# Patient Record
Sex: Female | Born: 1950
Health system: Southern US, Community
[De-identification: ages and names within clinical notes are randomized; demographics above are authoritative.]

## PROBLEM LIST (undated history)

## (undated) DIAGNOSIS — F419 Anxiety disorder, unspecified: Secondary | ICD-10-CM

## (undated) DIAGNOSIS — F329 Major depressive disorder, single episode, unspecified: Secondary | ICD-10-CM

## (undated) DIAGNOSIS — H269 Unspecified cataract: Secondary | ICD-10-CM

## (undated) DIAGNOSIS — M810 Age-related osteoporosis without current pathological fracture: Secondary | ICD-10-CM

## (undated) DIAGNOSIS — T7840XA Allergy, unspecified, initial encounter: Secondary | ICD-10-CM

## (undated) DIAGNOSIS — K219 Gastro-esophageal reflux disease without esophagitis: Secondary | ICD-10-CM

## (undated) DIAGNOSIS — E785 Hyperlipidemia, unspecified: Secondary | ICD-10-CM

## (undated) DIAGNOSIS — I10 Essential (primary) hypertension: Secondary | ICD-10-CM

## (undated) DIAGNOSIS — F32A Depression, unspecified: Secondary | ICD-10-CM

## (undated) HISTORY — PX: BREAST BIOPSY: SHX20

## (undated) HISTORY — DX: Age-related osteoporosis without current pathological fracture: M81.0

## (undated) HISTORY — DX: Anxiety disorder, unspecified: F41.9

## (undated) HISTORY — DX: Depression, unspecified: F32.A

## (undated) HISTORY — DX: Gastro-esophageal reflux disease without esophagitis: K21.9

## (undated) HISTORY — DX: Unspecified cataract: H26.9

## (undated) HISTORY — DX: Allergy, unspecified, initial encounter: T78.40XA

## (undated) HISTORY — DX: Hyperlipidemia, unspecified: E78.5

## (undated) HISTORY — DX: Major depressive disorder, single episode, unspecified: F32.9

## (undated) HISTORY — DX: Essential (primary) hypertension: I10

## (undated) HISTORY — PX: RHINOPLASTY: SUR1284

---

## 1980-11-27 HISTORY — PX: VAGINAL HYSTERECTOMY: SUR661

## 2001-11-27 DIAGNOSIS — J301 Allergic rhinitis due to pollen: Secondary | ICD-10-CM | POA: Insufficient documentation

## 2006-11-27 HISTORY — PX: OTHER SURGICAL HISTORY: SHX169

## 2007-09-29 ENCOUNTER — Other Ambulatory Visit: Payer: Self-pay

## 2007-09-29 ENCOUNTER — Inpatient Hospital Stay: Payer: Self-pay

## 2007-10-17 DIAGNOSIS — K922 Gastrointestinal hemorrhage, unspecified: Secondary | ICD-10-CM | POA: Insufficient documentation

## 2007-11-14 ENCOUNTER — Ambulatory Visit: Payer: Self-pay | Admitting: Gastroenterology

## 2007-11-14 LAB — HM COLONOSCOPY

## 2008-01-13 ENCOUNTER — Ambulatory Visit: Payer: Self-pay | Admitting: Family Medicine

## 2013-06-12 ENCOUNTER — Ambulatory Visit: Payer: Self-pay | Admitting: Family Medicine

## 2014-07-30 DIAGNOSIS — Z1159 Encounter for screening for other viral diseases: Secondary | ICD-10-CM | POA: Diagnosis not present

## 2014-07-30 DIAGNOSIS — I10 Essential (primary) hypertension: Secondary | ICD-10-CM | POA: Diagnosis not present

## 2014-07-30 DIAGNOSIS — R7309 Other abnormal glucose: Secondary | ICD-10-CM | POA: Diagnosis not present

## 2014-07-30 DIAGNOSIS — F411 Generalized anxiety disorder: Secondary | ICD-10-CM | POA: Diagnosis not present

## 2014-07-30 DIAGNOSIS — L259 Unspecified contact dermatitis, unspecified cause: Secondary | ICD-10-CM | POA: Diagnosis not present

## 2014-07-30 DIAGNOSIS — Z0000 Encounter for general adult medical examination without abnormal findings: Secondary | ICD-10-CM | POA: Diagnosis not present

## 2014-07-30 DIAGNOSIS — M2550 Pain in unspecified joint: Secondary | ICD-10-CM | POA: Diagnosis not present

## 2014-07-30 DIAGNOSIS — E785 Hyperlipidemia, unspecified: Secondary | ICD-10-CM | POA: Diagnosis not present

## 2014-07-30 DIAGNOSIS — Z23 Encounter for immunization: Secondary | ICD-10-CM | POA: Diagnosis not present

## 2014-07-30 LAB — LIPID PANEL
Cholesterol: 273 mg/dL — AB (ref 0–200)
HDL: 64 mg/dL (ref 35–70)
LDL Cholesterol: 146 mg/dL
Triglycerides: 316 mg/dL — AB (ref 40–160)

## 2014-07-30 LAB — BASIC METABOLIC PANEL
BUN: 17 mg/dL (ref 4–21)
Creatinine: 0.7 mg/dL (ref 0.5–1.1)
Glucose: 110 mg/dL
Potassium: 4.3 mmol/L (ref 3.4–5.3)
Sodium: 139 mmol/L (ref 137–147)

## 2014-07-30 LAB — HEMOGLOBIN A1C: Hemoglobin A1C: 5.8

## 2014-07-30 LAB — HM HEPATITIS C SCREENING LAB: HM Hepatitis Screen: NEGATIVE

## 2014-11-12 DIAGNOSIS — K130 Diseases of lips: Secondary | ICD-10-CM | POA: Diagnosis not present

## 2014-11-12 DIAGNOSIS — J329 Chronic sinusitis, unspecified: Secondary | ICD-10-CM | POA: Diagnosis not present

## 2014-11-12 DIAGNOSIS — R05 Cough: Secondary | ICD-10-CM | POA: Diagnosis not present

## 2014-11-18 DIAGNOSIS — S0501XA Injury of conjunctiva and corneal abrasion without foreign body, right eye, initial encounter: Secondary | ICD-10-CM | POA: Diagnosis not present

## 2014-11-24 DIAGNOSIS — S0501XD Injury of conjunctiva and corneal abrasion without foreign body, right eye, subsequent encounter: Secondary | ICD-10-CM | POA: Diagnosis not present

## 2015-08-13 ENCOUNTER — Other Ambulatory Visit: Payer: Self-pay | Admitting: Family Medicine

## 2015-08-23 ENCOUNTER — Other Ambulatory Visit: Payer: Self-pay | Admitting: Family Medicine

## 2015-08-23 NOTE — Telephone Encounter (Signed)
Please call in clorazepate 

## 2015-08-24 NOTE — Telephone Encounter (Signed)
Rx called in to pharmacy. 

## 2015-09-10 ENCOUNTER — Other Ambulatory Visit: Payer: Self-pay | Admitting: Family Medicine

## 2015-09-11 NOTE — Telephone Encounter (Signed)
Please call in alprazolam.  

## 2015-09-13 NOTE — Telephone Encounter (Signed)
Rx called in to pharmacy. 

## 2015-11-15 ENCOUNTER — Other Ambulatory Visit: Payer: Self-pay | Admitting: Family Medicine

## 2015-11-15 NOTE — Telephone Encounter (Signed)
Please call in alprazolam.  Please advise patient it is time for follow up o.v.  She has not been seen for over a year.

## 2015-11-16 NOTE — Telephone Encounter (Signed)
Rx called into pharmacy. Patient was notified that she is due for f/u ov.

## 2015-12-09 ENCOUNTER — Ambulatory Visit: Payer: Self-pay | Admitting: Family Medicine

## 2015-12-30 ENCOUNTER — Other Ambulatory Visit: Payer: Self-pay | Admitting: Family Medicine

## 2015-12-31 NOTE — Telephone Encounter (Signed)
Patient has not been seen for over a year. Needs  O.v. Scheduled before we can approve refill.

## 2016-01-05 NOTE — Telephone Encounter (Signed)
Tried calling pt, no answer.  Voice message, pt is not available at this time. Will try again later.

## 2016-01-07 NOTE — Telephone Encounter (Signed)
Left message to call back  

## 2016-01-12 NOTE — Telephone Encounter (Signed)
May call in quantity 30, no refills, advise pharmacy patient needs to contact office to schedule appointment

## 2016-01-12 NOTE — Telephone Encounter (Signed)
Contact number listed in chart is not the correct number.

## 2016-01-12 NOTE — Telephone Encounter (Signed)
Rx called into pharmacy. Advised that pt needs an ov appt.

## 2016-02-06 ENCOUNTER — Other Ambulatory Visit: Payer: Self-pay | Admitting: Family Medicine

## 2016-02-14 ENCOUNTER — Other Ambulatory Visit: Payer: Self-pay | Admitting: Family Medicine

## 2016-02-19 ENCOUNTER — Other Ambulatory Visit: Payer: Self-pay | Admitting: Family Medicine

## 2016-03-07 DIAGNOSIS — M25561 Pain in right knee: Secondary | ICD-10-CM | POA: Insufficient documentation

## 2016-03-07 DIAGNOSIS — F411 Generalized anxiety disorder: Secondary | ICD-10-CM | POA: Insufficient documentation

## 2016-03-07 DIAGNOSIS — M2550 Pain in unspecified joint: Secondary | ICD-10-CM | POA: Insufficient documentation

## 2016-03-07 DIAGNOSIS — N951 Menopausal and female climacteric states: Secondary | ICD-10-CM | POA: Insufficient documentation

## 2016-03-07 DIAGNOSIS — K219 Gastro-esophageal reflux disease without esophagitis: Secondary | ICD-10-CM | POA: Insufficient documentation

## 2016-03-07 DIAGNOSIS — L259 Unspecified contact dermatitis, unspecified cause: Secondary | ICD-10-CM | POA: Insufficient documentation

## 2016-03-14 ENCOUNTER — Ambulatory Visit (INDEPENDENT_AMBULATORY_CARE_PROVIDER_SITE_OTHER): Payer: Medicare Other | Admitting: Family Medicine

## 2016-03-14 ENCOUNTER — Encounter: Payer: Self-pay | Admitting: Family Medicine

## 2016-03-14 VITALS — BP 140/84 | HR 88 | Temp 98.3°F | Resp 16 | Ht 62.16 in | Wt 180.0 lb

## 2016-03-14 DIAGNOSIS — E785 Hyperlipidemia, unspecified: Secondary | ICD-10-CM | POA: Diagnosis not present

## 2016-03-14 DIAGNOSIS — Z0000 Encounter for general adult medical examination without abnormal findings: Secondary | ICD-10-CM | POA: Diagnosis not present

## 2016-03-14 DIAGNOSIS — F32A Depression, unspecified: Secondary | ICD-10-CM

## 2016-03-14 DIAGNOSIS — E2839 Other primary ovarian failure: Secondary | ICD-10-CM | POA: Diagnosis not present

## 2016-03-14 DIAGNOSIS — F411 Generalized anxiety disorder: Secondary | ICD-10-CM

## 2016-03-14 DIAGNOSIS — Z23 Encounter for immunization: Secondary | ICD-10-CM

## 2016-03-14 DIAGNOSIS — F329 Major depressive disorder, single episode, unspecified: Secondary | ICD-10-CM | POA: Diagnosis not present

## 2016-03-14 DIAGNOSIS — R7303 Prediabetes: Secondary | ICD-10-CM

## 2016-03-14 DIAGNOSIS — I10 Essential (primary) hypertension: Secondary | ICD-10-CM

## 2016-03-14 NOTE — Patient Instructions (Signed)
It is recommended to engage in 150 minutes of vigorous exercise every week.  

## 2016-03-14 NOTE — Progress Notes (Signed)
Patient: Gabrielle Salinas Ralph, Female    DOB: 06/25/1951, 65 y.o.   MRN: [id number] Visit Date: 03/14/2016  Today's Provider: Mila Merryonald Fisher, MD   Chief Complaint  Patient presents with  . Annual Exam  . Hypertension  . Depression  . Hyperlipidemia   Subjective:    Annual physical  Gabrielle Salinas Currington is a 65 y.o. female. She feels well. She reports exercising yes/walking. She reports she is sleeping fairly well.  -----------------------------------------------------------  Follow-up for depressive disorder from 07/30/2014; no changes, continue current medications. Follow-up for prediabetes from 07/30/2014; no changes.     Hypertension, follow-up:  BP Readings from Last 3 Encounters:  03/14/16 140/84  11/12/14 134/82    She was last seen for hypertension 07/30/2014.  BP at that visit was 140/96. Management since that visit includes; no changes.She reports good compliance with treatment. She is not having side effects. none  She is exercising. She is adherent to low salt diet.   Outside blood pressures are normal. She is experiencing none.  Patient denies none.   Cardiovascular risk factors include prediabetes.  Use of agents associated with hypertension: none.   ----------------------------------------------------------------------    Lipid/Cholesterol, Follow-up:   Last seen for this 07/30/2014.  Management since that visit includes; started Lovastatin 20 mg qd.  Last Lipid Panel:    Component Value Date/Time   CHOL 273* 07/30/2014   TRIG 316* 07/30/2014   HDL 64 07/30/2014   LDLCALC 146 07/30/2014    She reports good compliance with treatment. She is not having side effects. none  Wt Readings from Last 3 Encounters:  03/14/16 180 lb (81.647 kg)  11/12/14 174 lb (78.926 kg)    ----------------------------------------------------------------------      Review of Systems  Constitutional: Negative.   HENT: Negative.   Eyes: Positive for itching.    Respiratory: Negative.   Cardiovascular: Negative.   Gastrointestinal: Negative.   Endocrine: Negative.   Genitourinary: Negative.   Musculoskeletal: Positive for arthralgias and neck stiffness.  Skin: Negative.   Allergic/Immunologic: Positive for environmental allergies.  Neurological: Negative.   Hematological: Negative.   Psychiatric/Behavioral: Positive for decreased concentration. The patient is nervous/anxious.     Social History   Social History  . Marital Status: Divorced    Spouse Name: N/A  . Number of Children: N/A  . Years of Education: N/A   Occupational History  . Teacher    Social History Main Topics  . Smoking status: Never Smoker   . Smokeless tobacco: Not on file  . Alcohol Use: No  . Drug Use: Not on file  . Sexual Activity: Not on file   Other Topics Concern  . Not on file   Social History Narrative    History reviewed. No pertinent past medical history.   Patient Active Problem List   Diagnosis Date Noted  . GERD (gastroesophageal reflux disease) 03/07/2016  . Anxiety state 03/07/2016  . Arthralgia 03/07/2016  . Right knee pain 03/07/2016  . Menopausal symptom 03/07/2016  . Contact dermatitis 03/07/2016  . Hemorrhage of gastrointestinal tract 10/17/2007  . Cervicalgia 10/17/2007  . HLD (hyperlipidemia) 03/21/2007  . Prediabetes 03/21/2007  . Allergic rhinitis due to pollen 11/27/2001  . Depressive disorder 11/27/2001  . Hypertension 11/27/1997    Past Surgical History  Procedure Laterality Date  . Gi bleed  2008  . Rhinoplasty    . Vaginal hysterectomy  1982    due to abnormal pap smears and menorrhagia    Her family  history includes Diabetes in her father, sister, sister, and sister; Heart attack in her father; Lupus in her mother.    Previous Medications   ALPRAZOLAM (XANAX) 0.25 MG TABLET    TAKE 1 TO 2 TABLETS EVERY 4 HOURS AS NEEDED   CALCIUM CARBONATE-VITAMIN D (CALCIUM 500 + D PO)    Take 1 capsule by mouth daily.    CHOLECALCIFEROL (VITAMIN D) 1000 UNITS TABLET    Take 1 tablet by mouth daily.   CLORAZEPATE (TRANXENE) 7.5 MG TABLET    TAKE 1 TABLET BY MOUTH 3 TIMES A DAY AS NEEDED   LISINOPRIL-HYDROCHLOROTHIAZIDE (PRINZIDE,ZESTORETIC) 10-12.5 MG PER TABLET    TAKE 1 TABLET BY MOUTH DAILY   LOVASTATIN (MEVACOR) 20 MG TABLET    Take 1 tablet by mouth at bedtime.   MISC NATURAL PRODUCTS (JOINT SUPPORT PO)    Take by mouth.   MULTIPLE VITAMINS-MINERALS (MULTIVITAMIN ADULT PO)    Take 1 tablet by mouth daily.   OMEGA-3 FATTY ACIDS (FISH OIL) 1000 MG CAPS    Take 2 capsules by mouth 2 (two) times daily.   TRIAMCINOLONE CREAM (KENALOG) 0.5 %    Apply 1 application topically daily as needed.   TURMERIC PO    Take by mouth.    Patient Care Team: Malva Limes, MD as PCP - General (Family Medicine) Linus Salmons, MD (Otolaryngology)     Objective:   Vitals: BP 140/84 mmHg  Pulse 88  Temp(Src) 98.3 F (36.8 C) (Oral)  Resp 16  Ht 5' 2.16" (1.579 m)  Wt 180 lb (81.647 kg)  BMI 32.75 kg/m2  SpO2 97%  LMP 11/27/1980  Physical Exam   General Appearance:    Alert, cooperative, no distress, appears stated age, overweight  Head:    Normocephalic, without obvious abnormality, atraumatic  Eyes:    PERRL, conjunctiva/corneas clear, EOM's intact, fundi    benign, both eyes  Ears:    Normal TM's and external ear canals, both ears  Nose:   Nares normal, septum midline, mucosa normal, no drainage    or sinus tenderness  Throat:   Lips, mucosa, and tongue normal; teeth and gums normal  Neck:   Supple, symmetrical, trachea midline, no adenopathy;    thyroid:  no enlargement/tenderness/nodules; no carotid   bruit or JVD  Back:     Symmetric, no curvature, ROM normal, no CVA tenderness  Lungs:     Clear to auscultation bilaterally, respirations unlabored  Chest Wall:    No tenderness or deformity   Heart:    Regular rate and rhythm, S1 and S2 normal, no murmur, rub   or gallop  Breast Exam:    normal  appearance, no masses or tenderness  Abdomen:     Soft, non-tender, bowel sounds active all four quadrants,    no masses, no organomegaly  Pelvic:    deferred  Extremities:   Extremities normal, atraumatic, no cyanosis or edema  Pulses:   2+ and symmetric all extremities  Skin:   Skin color, texture, turgor normal, no rashes or lesions  Lymph nodes:   Cervical, supraclavicular, and axillary nodes normal  Neurologic:   CNII-XII intact, normal strength, sensation and reflexes    throughout    Activities of Daily Living In your present state of health, do you have any difficulty performing the following activities: 03/14/2016  Hearing? N  Vision? N  Difficulty concentrating or making decisions? Y  Walking or climbing stairs? N  Dressing or bathing? N  Doing errands,  shopping? N    Fall Risk Assessment Fall Risk  03/14/2016  Falls in the past year? Yes  Number falls in past yr: 1  Injury with Fall? No     Depression Screen PHQ 2/9 Scores 03/14/2016  PHQ - 2 Score 1  PHQ- 9 Score 7    Cognitive Testing - 6-CIT  Correct? Score   What year is it? yes 0 0 or 4  What month is it? yes 0 0 or 3  Memorize:    Floyde Parkins,  40,  [address]  [place],      What time is it? (within 1 hour) yes 0 0 or 3  Count backwards from 20 yes 0 0, 2, or 4  Name the months of the year yes 0 0, 2, or 4  Repeat name & address above yes 3 0, 2, 4, 6, 8, or 10       TOTAL SCORE  3/28   Interpretation:  Normal  Normal (0-7) Abnormal (8-28)    Audit-C Alcohol Use Screening  Question Answer Points  How often do you have alcoholic drink? 2-4 times monthly 2  On days you do drink alcohol, how many drinks do you typically consume? 1 or 2 0  How oftey will you drink 6 or more in a total? less than once times monthly 1  Total Score:  3   A score of 3 or more in women, and 4 or more in men indicates increased risk for alcohol abuse, EXCEPT if all of the points are from question 1.     Assessment &  Plan:    Annual Physical Reviewed patient's Family Medical History Reviewed and updated list of patient's medical providers Assessment of cognitive impairment was done Assessed patient's functional ability Established a written schedule for health screening services Health Risk Assessent Completed and Reviewed  Exercise Activities and Dietary recommendations Goals    None      Immunization History  Administered Date(s) Administered  . Pneumococcal Conjugate-13 07/30/2014  . Pneumococcal Polysaccharide-23 10/22/1998  . Td 07/23/1995  . Tdap 09/29/2011  . Zoster 07/30/2014    Health Maintenance  Topic Date Due  . MAMMOGRAM  07/12/1999  . DEXA SCAN  07/11/2014  . PNA vac Low Risk Adult (2 of 2 - PPSV23) 07/31/2015  . INFLUENZA VACCINE  06/27/2016  . COLONOSCOPY  11/13/2017  . TETANUS/TDAP  09/28/2021  . ZOSTAVAX  Completed  . Hepatitis C Screening  Completed      Discussed health benefits of physical activity, and encouraged her to engage in regular exercise appropriate for her age and condition.    --------------------------------------------------------------------------------  1. Annual physical  Generally doing well. Reviewed health maintenance guidelines.   2. Essential hypertension Well controlled.  Continue current medications.   - Renal function panel - EKG 12-Lead  3. Prediabetes  - Hemoglobin A1c  4. Depressive disorder No longer taking antidepressants, feels well off of medications.   5. HLD (hyperlipidemia) She is tolerating lovastatin well with no adverse effects.   - Lipid panel - Hepatic function panel  6. Anxiety state Doing well.   7. Need for pneumococcal vaccination  - Pneumococcal polysaccharide vaccine 23-valent greater than or equal to 2yo subcutaneous/IM  8. Estrogen deficiency  - DG Bone Density; Future

## 2016-03-15 LAB — RENAL FUNCTION PANEL
Albumin: 4.4 g/dL (ref 3.6–4.8)
BUN/Creatinine Ratio: 22 (ref 12–28)
BUN: 15 mg/dL (ref 8–27)
CALCIUM: 9.9 mg/dL (ref 8.7–10.3)
CO2: 28 mmol/L (ref 18–29)
CREATININE: 0.69 mg/dL (ref 0.57–1.00)
Chloride: 94 mmol/L — ABNORMAL LOW (ref 96–106)
GFR calc Af Amer: 105 mL/min/{1.73_m2} (ref >59)
GFR, EST NON AFRICAN AMERICAN: 91 mL/min/{1.73_m2} (ref >59)
Glucose: 112 mg/dL — ABNORMAL HIGH (ref 65–99)
PHOSPHORUS: 4.1 mg/dL (ref 2.5–4.5)
Potassium: 3.9 mmol/L (ref 3.5–5.2)
SODIUM: 140 mmol/L (ref 134–144)

## 2016-03-15 LAB — HEMOGLOBIN A1C
ESTIMATED AVERAGE GLUCOSE: 120 mg/dL
HEMOGLOBIN A1C: 5.8 % — AB (ref 4.8–5.6)

## 2016-03-15 LAB — LIPID PANEL
CHOLESTEROL TOTAL: 221 mg/dL — AB (ref 100–199)
Chol/HDL Ratio: 3.6 ratio units (ref 0.0–4.4)
HDL: 62 mg/dL (ref >39)
LDL CALC: 110 mg/dL — AB (ref 0–99)
Triglycerides: 246 mg/dL — ABNORMAL HIGH (ref 0–149)
VLDL CHOLESTEROL CAL: 49 mg/dL — AB (ref 5–40)

## 2016-03-15 LAB — HEPATIC FUNCTION PANEL
ALT: 22 IU/L (ref 0–32)
AST: 21 IU/L (ref 0–40)
Alkaline Phosphatase: 96 IU/L (ref 39–117)
BILIRUBIN TOTAL: 0.4 mg/dL (ref 0.0–1.2)
BILIRUBIN, DIRECT: 0.11 mg/dL (ref 0.00–0.40)
TOTAL PROTEIN: 7.2 g/dL (ref 6.0–8.5)

## 2016-03-17 ENCOUNTER — Telehealth: Payer: Self-pay

## 2016-03-17 ENCOUNTER — Other Ambulatory Visit: Payer: Self-pay | Admitting: *Deleted

## 2016-03-17 MED ORDER — MONTELUKAST SODIUM 10 MG PO TABS: 10.0000 mg | ORAL_TABLET | Freq: Every day | ORAL | Status: DC

## 2016-03-17 MED ORDER — ALPRAZOLAM 0.25 MG PO TABS: ORAL_TABLET | ORAL | Status: DC

## 2016-03-17 MED ORDER — CLORAZEPATE DIPOTASSIUM 7.5 MG PO TABS: 7.5000 mg | ORAL_TABLET | Freq: Three times a day (TID) | ORAL | Status: DC | PRN

## 2016-03-17 NOTE — Telephone Encounter (Signed)
Have sent rx for montelukast to her pharmacy for allergies.

## 2016-03-17 NOTE — Telephone Encounter (Signed)
Patient was notified.

## 2016-03-17 NOTE — Telephone Encounter (Signed)
Please call in alprazolam. And tranxene. Thanks.

## 2016-03-17 NOTE — Telephone Encounter (Signed)
Patient states she saw you on Tuesday 18th and she mentioned having allergy symptoms and states she has tried Claritin as you suggested, Sudafed and used some claritin nasal spray but symptoms are not better. She is having drainage, eyes itchy and crusting up, itchy throat, post nasal drainage. No fever, no body aches, no chills. No cough. Can something else she can try or have RX for? Does she need to come back in for appointment?-aa

## 2016-03-17 NOTE — Telephone Encounter (Signed)
Please advise 

## 2016-03-20 NOTE — Telephone Encounter (Signed)
Rx called in to pharmacy. 

## 2016-03-27 ENCOUNTER — Ambulatory Visit: Payer: Self-pay

## 2016-03-27 ENCOUNTER — Other Ambulatory Visit: Payer: Self-pay | Admitting: Family Medicine

## 2016-03-27 DIAGNOSIS — Z1231 Encounter for screening mammogram for malignant neoplasm of breast: Secondary | ICD-10-CM

## 2016-04-03 ENCOUNTER — Telehealth: Payer: Self-pay | Admitting: Family Medicine

## 2016-04-03 MED ORDER — FLUOXETINE HCL 20 MG PO CAPS: 20.0000 mg | ORAL_CAPSULE | Freq: Every day | ORAL | Status: DC

## 2016-04-03 NOTE — Telephone Encounter (Signed)
Have sent prescription for fluoxetine to [facility]. She needs to schedule follow up in 1 month.

## 2016-04-03 NOTE — Telephone Encounter (Signed)
Please advise 

## 2016-04-03 NOTE — Telephone Encounter (Signed)
Pt called saying she thinks she needs to go back on something for anxiety and depression.  She was doing well when she came in last month for her physical but since then has starting having depression and crying with anxiety.  Her call back is  [phone]  She will come in but it is hard for her to getin,  York SpanielSaid she was just in last month.  Thanks, Fortune Brandsteri

## 2016-04-04 NOTE — Telephone Encounter (Signed)
Tried calling patient, no answer. Will try again later.  

## 2016-04-04 NOTE — Telephone Encounter (Signed)
Patient was notified.

## 2016-04-06 ENCOUNTER — Ambulatory Visit: Payer: Self-pay

## 2016-04-20 ENCOUNTER — Ambulatory Visit
Admission: RE | Admit: 2016-04-20 | Discharge: 2016-04-20 | Disposition: A | Discharge status: Home / Self Care | Payer: Medicare Other | Source: Ambulatory Visit | Attending: Family Medicine | Admitting: Family Medicine

## 2016-04-20 ENCOUNTER — Other Ambulatory Visit: Payer: Self-pay | Admitting: Family Medicine

## 2016-04-20 ENCOUNTER — Encounter: Payer: Self-pay | Admitting: Family Medicine

## 2016-04-20 DIAGNOSIS — Z1231 Encounter for screening mammogram for malignant neoplasm of breast: Secondary | ICD-10-CM

## 2016-04-20 DIAGNOSIS — M810 Age-related osteoporosis without current pathological fracture: Secondary | ICD-10-CM | POA: Diagnosis not present

## 2016-04-20 DIAGNOSIS — E2839 Other primary ovarian failure: Secondary | ICD-10-CM | POA: Diagnosis present

## 2016-04-21 ENCOUNTER — Telehealth: Payer: Self-pay | Admitting: *Deleted

## 2016-04-21 ENCOUNTER — Other Ambulatory Visit: Payer: Self-pay | Admitting: Family Medicine

## 2016-04-21 DIAGNOSIS — N6459 Other signs and symptoms in breast: Secondary | ICD-10-CM

## 2016-04-21 MED ORDER — ALENDRONATE SODIUM 70 MG PO TABS: 70.0000 mg | ORAL_TABLET | ORAL | Status: DC

## 2016-04-21 NOTE — Telephone Encounter (Signed)
Patient was notified of results. Patient expressed understanding. Rx sent to pharmacy.  

## 2016-04-21 NOTE — Telephone Encounter (Signed)
-----   Message from Malva Limesonald E Fisher, MD sent at 04/20/2016  1:09 PM EDT ----- Bone density test shows osteoporosis in hip. Should start alendronate 70mg  once a week. #4, rf x4. Follow up 4 months

## 2016-04-26 ENCOUNTER — Other Ambulatory Visit: Payer: Self-pay | Admitting: Family Medicine

## 2016-04-26 NOTE — Telephone Encounter (Signed)
Please call in clorazepate 

## 2016-04-27 NOTE — Telephone Encounter (Signed)
Rx called in to pharmacy. 

## 2016-05-08 ENCOUNTER — Ambulatory Visit
Admission: RE | Admit: 2016-05-08 | Discharge: 2016-05-08 | Disposition: A | Discharge status: Home / Self Care | Payer: Medicare Other | Source: Ambulatory Visit | Attending: Family Medicine | Admitting: Family Medicine

## 2016-05-08 DIAGNOSIS — N6012 Diffuse cystic mastopathy of left breast: Secondary | ICD-10-CM | POA: Diagnosis not present

## 2016-05-08 DIAGNOSIS — N6459 Other signs and symptoms in breast: Secondary | ICD-10-CM

## 2016-05-08 DIAGNOSIS — N63 Unspecified lump in breast: Secondary | ICD-10-CM | POA: Diagnosis not present

## 2016-05-08 DIAGNOSIS — N6001 Solitary cyst of right breast: Secondary | ICD-10-CM | POA: Diagnosis not present

## 2016-05-08 DIAGNOSIS — R921 Mammographic calcification found on diagnostic imaging of breast: Secondary | ICD-10-CM | POA: Insufficient documentation

## 2016-05-08 DIAGNOSIS — R6889 Other general symptoms and signs: Secondary | ICD-10-CM | POA: Diagnosis present

## 2016-05-29 ENCOUNTER — Other Ambulatory Visit: Payer: Self-pay | Admitting: Family Medicine

## 2016-05-29 DIAGNOSIS — F411 Generalized anxiety disorder: Secondary | ICD-10-CM

## 2016-07-03 DIAGNOSIS — L720 Epidermal cyst: Secondary | ICD-10-CM | POA: Diagnosis not present

## 2016-07-10 ENCOUNTER — Other Ambulatory Visit: Payer: Self-pay | Admitting: Family Medicine

## 2016-07-15 ENCOUNTER — Other Ambulatory Visit: Payer: Self-pay | Admitting: Family Medicine

## 2016-07-17 DIAGNOSIS — L720 Epidermal cyst: Secondary | ICD-10-CM | POA: Diagnosis not present

## 2016-08-09 ENCOUNTER — Other Ambulatory Visit: Payer: Self-pay | Admitting: Family Medicine

## 2016-08-10 NOTE — Telephone Encounter (Signed)
Please call in

## 2016-08-10 NOTE — Telephone Encounter (Signed)
Rx called in to pharmacy. 

## 2016-09-02 ENCOUNTER — Ambulatory Visit (INDEPENDENT_AMBULATORY_CARE_PROVIDER_SITE_OTHER): Payer: Medicare Other

## 2016-09-02 DIAGNOSIS — Z23 Encounter for immunization: Secondary | ICD-10-CM | POA: Diagnosis not present

## 2016-09-10 ENCOUNTER — Other Ambulatory Visit: Payer: Self-pay | Admitting: Family Medicine

## 2016-09-11 ENCOUNTER — Other Ambulatory Visit: Payer: Self-pay | Admitting: Family Medicine

## 2016-09-11 DIAGNOSIS — F411 Generalized anxiety disorder: Secondary | ICD-10-CM

## 2016-09-11 MED ORDER — FLUOXETINE HCL 20 MG PO CAPS: 20.0000 mg | ORAL_CAPSULE | Freq: Every day | ORAL | 2 refills | Status: DC

## 2016-09-11 NOTE — Telephone Encounter (Signed)
Rx called in to pharmacy. 

## 2016-09-11 NOTE — Telephone Encounter (Signed)
Please call in alprazolam.  

## 2016-09-18 ENCOUNTER — Other Ambulatory Visit: Payer: Self-pay | Admitting: Family Medicine

## 2016-09-18 NOTE — Telephone Encounter (Signed)
Please call in clorazepate 

## 2016-09-19 NOTE — Telephone Encounter (Signed)
Rx called in to pharmacy. 

## 2016-09-22 ENCOUNTER — Other Ambulatory Visit: Payer: Self-pay | Admitting: Family Medicine

## 2016-09-25 NOTE — Telephone Encounter (Signed)
Please call in Tranxene

## 2016-09-25 NOTE — Telephone Encounter (Signed)
Rx called in to pharmacy. 

## 2016-10-17 ENCOUNTER — Encounter: Payer: Self-pay | Admitting: Family Medicine

## 2016-10-17 ENCOUNTER — Ambulatory Visit (INDEPENDENT_AMBULATORY_CARE_PROVIDER_SITE_OTHER): Payer: Medicare Other | Admitting: Family Medicine

## 2016-10-17 VITALS — BP 130/78 | Temp 98.5°F | Resp 16 | Wt 182.0 lb

## 2016-10-17 DIAGNOSIS — R109 Unspecified abdominal pain: Secondary | ICD-10-CM

## 2016-10-17 DIAGNOSIS — N390 Urinary tract infection, site not specified: Secondary | ICD-10-CM | POA: Diagnosis not present

## 2016-10-17 DIAGNOSIS — R319 Hematuria, unspecified: Secondary | ICD-10-CM | POA: Diagnosis not present

## 2016-10-17 DIAGNOSIS — F411 Generalized anxiety disorder: Secondary | ICD-10-CM | POA: Diagnosis not present

## 2016-10-17 DIAGNOSIS — R10A0 Flank pain, unspecified side: Secondary | ICD-10-CM

## 2016-10-17 LAB — POCT URINALYSIS DIPSTICK
BILIRUBIN UA: NEGATIVE
GLUCOSE UA: NEGATIVE
Ketones, UA: NEGATIVE
NITRITE UA: NEGATIVE
PH UA: 7
Protein, UA: NEGATIVE
SPEC GRAV UA: 1.025
UROBILINOGEN UA: 0.2

## 2016-10-17 MED ORDER — CLORAZEPATE DIPOTASSIUM 7.5 MG PO TABS: ORAL_TABLET | ORAL | 3 refills | Status: DC

## 2016-10-17 MED ORDER — CYCLOBENZAPRINE HCL 5 MG PO TABS: 5.0000 mg | ORAL_TABLET | Freq: Three times a day (TID) | ORAL | 1 refills | Status: DC | PRN

## 2016-10-17 MED ORDER — CIPROFLOXACIN HCL 500 MG PO TABS: 500.0000 mg | ORAL_TABLET | Freq: Two times a day (BID) | ORAL | 0 refills | Status: DC

## 2016-10-17 NOTE — Progress Notes (Signed)
Patient: Gabrielle Salinas Female    DOB: Dec 17, 1950   65 y.o.   MRN: [id number] Visit Date: 10/17/2016  Today's Provider: Mila Merryonald Laekyn Rayos, MD   Chief Complaint  Patient presents with  . Back Pain    X 1 week.    Subjective:    HPI Patient comes in today c/o upper back pain. Patient reports she has had symptoms X 1 week. She describes the pain as a dull ache that becomes sharp when she twists her back to the right. She reports that she has been exercising more than usual, and that it could be a pulled muscle. She denies any urinary frequency, burning on urination, or hematuria. Patient has been taking a cranberry supplement to help with her symptoms.     Allergies  Allergen Reactions  . Bactrim  [Sulfamethoxazole-Trimethoprim]   . Daypro  [Oxaprozin]   . Sulfa Antibiotics      Current Outpatient Prescriptions:  .  alendronate (FOSAMAX) 70 MG tablet, TAKE 1 TABLET BY MOUTH ONCE A WEEK. TAKE WITH FULL GLASS OF WATER ON EMPTY STOMACH, Disp: 4 tablet, Rfl: 12 .  ALPRAZolam (XANAX) 0.25 MG tablet, Take 1-2 tablets (0.25-0.5 mg total) by mouth every 4 (four) hours as needed for anxiety (not to exceed 4 tablets per day)., Disp: 30 tablet, Rfl: 5 .  Calcium Carbonate-Vitamin D (CALCIUM 500 + D PO), Take 1 capsule by mouth daily., Disp: , Rfl:  .  cholecalciferol (VITAMIN D) 1000 units tablet, Take 1 tablet by mouth daily., Disp: , Rfl:  .  FLUoxetine (PROZAC) 20 MG capsule, Take 1 capsule (20 mg total) by mouth daily., Disp: 90 capsule, Rfl: 2 .  lisinopril-hydrochlorothiazide (PRINZIDE,ZESTORETIC) 10-12.5 MG tablet, TAKE 1 TABLET BY MOUTH EVERY DAY, Disp: 30 tablet, Rfl: 12 .  lovastatin (MEVACOR) 20 MG tablet, TAKE 1 TABLET BY MOUTH AT BEDTIME, Disp: 30 tablet, Rfl: 11 .  Misc Natural Products (JOINT SUPPORT PO), Take by mouth., Disp: , Rfl:  .  montelukast (SINGULAIR) 10 MG tablet, TAKE 1 TABLET (10 MG TOTAL) BY MOUTH DAILY. FOR ALLERGIES, Disp: 30 tablet, Rfl: 6 .  Multiple  Vitamins-Minerals (MULTIVITAMIN ADULT PO), Take 1 tablet by mouth daily., Disp: , Rfl:  .  Omega-3 Fatty Acids (FISH OIL) 1000 MG CAPS, Take 2 capsules by mouth 2 (two) times daily., Disp: , Rfl:  .  triamcinolone cream (KENALOG) 0.5 %, Apply 1 application topically daily as needed., Disp: , Rfl:  .  TURMERIC PO, Take by mouth., Disp: , Rfl:  .  clorazepate (TRANXENE) 7.5 MG tablet, TAKE ONE TABLET 3 TIMES DAILY AS NEEDED (Patient not taking: Reported on 10/17/2016), Disp: 30 tablet, Rfl: 0  Review of Systems  Constitutional: Positive for activity change, chills and fatigue. Negative for appetite change, diaphoresis, fever and unexpected weight change.  Gastrointestinal: Negative for abdominal pain, constipation, diarrhea and nausea.  Genitourinary: Positive for flank pain. Negative for decreased urine volume, difficulty urinating, frequency, hematuria and urgency.  Musculoskeletal: Positive for back pain and myalgias.  Neurological: Negative.     Social History  Substance Use Topics  . Smoking status: Never Smoker  . Smokeless tobacco: Not on file  . Alcohol use No   Objective:   BP 130/78 (BP Location: Left Arm, Patient Position: Sitting, Cuff Size: Large)   Temp 98.5 F (36.9 C)   Resp 16   Wt 182 lb (82.6 kg)   LMP 11/27/1980   BMI 33.12 kg/m   Physical Exam  General Appearance:    Alert, cooperative, no distress  Eyes:    PERRL, conjunctiva/corneas clear, EOM's intact       Lungs:     Clear to auscultation bilaterally, respirations unlabored  Heart:    Regular rate and rhythm  MS:   Mild spasm noted left para lumbar muscles.   Abdomen:   bowel sounds present and normal in all 4 quadrants, soft, round, nontender or nondistended. Slight left CVAT.     Results for orders placed or performed in visit on 10/17/16  POCT urinalysis dipstick  Result Value Ref Range   Color, UA yellow    Clarity, UA cloudy    Glucose, UA negative    Bilirubin, UA negative    Ketones, UA  negative    Spec Grav, UA 1.025    Blood, UA non hemolyzed mod    pH, UA 7.0    Protein, UA negative    Urobilinogen, UA 0.2    Nitrite, UA negative    Leukocytes, UA moderate (2+) (A) Negative       Assessment & Plan:     1. Urinary tract infection with hematuria, site unspecified  - Urine culture - ciprofloxacin (CIPRO) 500 MG tablet; Take 1 tablet (500 mg total) by mouth 2 (two) times daily.  Dispense: 20 tablet; Refill: 0  2. Flank pain She does have some tender muscle spasms on left side of back. I think this pain is more muscular than renal.  - POCT urinalysis dipstick - cyclobenzaprine (FLEXERIL) 5 MG tablet; Take 1 tablet (5 mg total) by mouth 3 (three) times daily as needed for muscle spasms.  Dispense: 30 tablet; Refill: 1  3. Anxiety state She states clorazepate is working well and requests refills. She does not take it every day and does not take It when taking alprazolam.  - clorazepate (TRANXENE) 7.5 MG tablet; TAKE ONE TABLET 3 TIMES DAILY AS NEEDED  Dispense: 30 tablet; Refill: 3     The entirety of the information documented in the History of Present Illness, Review of Systems and Physical Exam were personally obtained by me. Portions of this information were initially documented by Anson [place]achelle Presley, CMA and reviewed by me for thoroughness and accuracy.    Mila Merryonald Torrie Namba, MD  [facility]

## 2016-10-19 LAB — URINE CULTURE

## 2016-10-23 ENCOUNTER — Telehealth: Payer: Self-pay

## 2016-10-23 NOTE — Telephone Encounter (Signed)
-----   Message from Malva Limesonald E Fisher, MD sent at 10/23/2016  9:51 AM EST ----- Urine cultures are negative. If symptoms have resolved then no further evaluation is needed.

## 2016-10-23 NOTE — Telephone Encounter (Signed)
Patient has been advised. KW 

## 2016-11-23 ENCOUNTER — Telehealth: Payer: Self-pay | Admitting: Family Medicine

## 2016-11-23 DIAGNOSIS — R928 Other abnormal and inconclusive findings on diagnostic imaging of breast: Secondary | ICD-10-CM

## 2016-11-23 NOTE — Telephone Encounter (Signed)
Patient states she got a letter in the mail stating she needs to get a mammogram, pt sates she [facility] but was told her PCP needed to put a order in to get this done. Thanks CC

## 2016-11-24 ENCOUNTER — Other Ambulatory Visit: Payer: Self-pay | Admitting: Family Medicine

## 2016-11-24 DIAGNOSIS — R928 Other abnormal and inconclusive findings on diagnostic imaging of breast: Secondary | ICD-10-CM

## 2016-11-24 NOTE — Telephone Encounter (Signed)
Please order diagnostic mammogram and ultrasound left breast.

## 2016-11-24 NOTE — Telephone Encounter (Signed)
Please advise 

## 2016-12-08 ENCOUNTER — Ambulatory Visit
Admission: RE | Admit: 2016-12-08 | Discharge: 2016-12-08 | Disposition: A | Discharge status: Home / Self Care | Payer: Medicare Other | Source: Ambulatory Visit | Attending: Family Medicine | Admitting: Family Medicine

## 2016-12-08 ENCOUNTER — Other Ambulatory Visit: Payer: Self-pay | Admitting: Family Medicine

## 2016-12-08 DIAGNOSIS — R928 Other abnormal and inconclusive findings on diagnostic imaging of breast: Secondary | ICD-10-CM

## 2016-12-08 DIAGNOSIS — N6002 Solitary cyst of left breast: Secondary | ICD-10-CM | POA: Diagnosis not present

## 2016-12-08 DIAGNOSIS — R922 Inconclusive mammogram: Secondary | ICD-10-CM | POA: Diagnosis not present

## 2016-12-28 ENCOUNTER — Ambulatory Visit: Payer: Medicare Other | Admitting: Family Medicine

## 2017-01-18 DIAGNOSIS — M170 Bilateral primary osteoarthritis of knee: Secondary | ICD-10-CM | POA: Diagnosis not present

## 2017-01-19 ENCOUNTER — Telehealth: Payer: Self-pay | Admitting: Family Medicine

## 2017-01-19 NOTE — Telephone Encounter (Signed)
Called to reschedule awv and f/up with Dr. Sherrie MustacheFisher - knb

## 2017-02-11 ENCOUNTER — Other Ambulatory Visit: Payer: Self-pay | Admitting: Family Medicine

## 2017-02-22 ENCOUNTER — Other Ambulatory Visit: Payer: Self-pay | Admitting: Family Medicine

## 2017-02-23 NOTE — Telephone Encounter (Signed)
Please call in alprazolam.  

## 2017-03-10 DIAGNOSIS — R030 Elevated blood-pressure reading, without diagnosis of hypertension: Secondary | ICD-10-CM | POA: Diagnosis not present

## 2017-03-10 DIAGNOSIS — J309 Allergic rhinitis, unspecified: Secondary | ICD-10-CM | POA: Diagnosis not present

## 2017-03-22 ENCOUNTER — Other Ambulatory Visit: Payer: Self-pay | Admitting: Family Medicine

## 2017-03-23 ENCOUNTER — Ambulatory Visit: Payer: Medicare Other

## 2017-03-23 ENCOUNTER — Encounter: Payer: Medicare Other | Admitting: Family Medicine

## 2017-03-23 NOTE — Telephone Encounter (Signed)
Please call in Librium

## 2017-03-23 NOTE — Telephone Encounter (Signed)
RX called in-aa 

## 2017-04-12 ENCOUNTER — Other Ambulatory Visit: Payer: Self-pay | Admitting: Family Medicine

## 2017-04-13 ENCOUNTER — Encounter: Payer: Self-pay | Admitting: Family Medicine

## 2017-04-13 ENCOUNTER — Ambulatory Visit: Payer: Medicare Other

## 2017-04-13 ENCOUNTER — Telehealth: Payer: Self-pay | Admitting: Family Medicine

## 2017-04-13 ENCOUNTER — Ambulatory Visit (INDEPENDENT_AMBULATORY_CARE_PROVIDER_SITE_OTHER): Payer: Medicare Other | Admitting: Family Medicine

## 2017-04-13 VITALS — BP 112/78 | HR 80 | Temp 97.6°F | Resp 16 | Ht 62.0 in | Wt 183.0 lb

## 2017-04-13 DIAGNOSIS — I10 Essential (primary) hypertension: Secondary | ICD-10-CM

## 2017-04-13 DIAGNOSIS — M810 Age-related osteoporosis without current pathological fracture: Secondary | ICD-10-CM

## 2017-04-13 DIAGNOSIS — Z0000 Encounter for general adult medical examination without abnormal findings: Secondary | ICD-10-CM

## 2017-04-13 DIAGNOSIS — F411 Generalized anxiety disorder: Secondary | ICD-10-CM

## 2017-04-13 DIAGNOSIS — E785 Hyperlipidemia, unspecified: Secondary | ICD-10-CM | POA: Diagnosis not present

## 2017-04-13 DIAGNOSIS — Z1211 Encounter for screening for malignant neoplasm of colon: Secondary | ICD-10-CM

## 2017-04-13 DIAGNOSIS — R7303 Prediabetes: Secondary | ICD-10-CM | POA: Diagnosis not present

## 2017-04-13 NOTE — Patient Instructions (Signed)
Preventive Care 68 Years and Older, Female Preventive care refers to lifestyle choices and visits with your health care provider that can promote health and wellness. What does preventive care include?  A yearly physical exam. This is also called an annual well check.  Dental exams once or twice a year.  Routine eye exams. Ask your health care provider how often you should have your eyes checked.  Personal lifestyle choices, including:  Daily care of your teeth and gums.  Regular physical activity.  Eating a healthy diet.  Avoiding tobacco and drug use.  Limiting alcohol use.  Practicing safe sex.  Taking low-dose aspirin every day.  Taking vitamin and mineral supplements as recommended by your health care provider. What happens during an annual well check? The services and screenings done by your health care provider during your annual well check will depend on your age, overall health, lifestyle risk factors, and family history of disease. Counseling  Your health care provider may ask you questions about your:  Alcohol use.  Tobacco use.  Drug use.  Emotional well-being.  Home and relationship well-being.  Sexual activity.  Eating habits.  History of falls.  Memory and ability to understand (cognition).  Work and work environment.  Reproductive health. Screening  You may have the following tests or measurements:  Height, weight, and BMI.  Blood pressure.  Lipid and cholesterol levels. These may be checked every 5 years, or more frequently if you are over 50 years old.  Skin check.  Lung cancer screening. You may have this screening every year starting at age 55 if you have a 30-pack-year history of smoking and currently smoke or have quit within the past 15 years.  Fecal occult blood test (FOBT) of the stool. You may have this test every year starting at age 50.  Flexible sigmoidoscopy or colonoscopy. You may have a sigmoidoscopy every 5 years or  a colonoscopy every 10 years starting at age 50.  Hepatitis C blood test.  Hepatitis B blood test.  Sexually transmitted disease (STD) testing.  Diabetes screening. This is done by checking your blood sugar (glucose) after you have not eaten for a while (fasting). You may have this done every 1-3 years.  Bone density scan. This is done to screen for osteoporosis. You may have this done starting at age 68.  Mammogram. This may be done every 1-2 years. Talk to your health care provider about how often you should have regular mammograms. Talk with your health care provider about your test results, treatment options, and if necessary, the need for more tests. Vaccines  Your health care provider may recommend certain vaccines, such as:  Influenza vaccine. This is recommended every year.  Tetanus, diphtheria, and acellular pertussis (Tdap, Td) vaccine. You may need a Td booster every 10 years.  Varicella vaccine. You may need this if you have not been vaccinated.  Zoster vaccine. You may need this after age 60.  Measles, mumps, and rubella (MMR) vaccine. You may need at least one dose of MMR if you were born in 1957 or later. You may also need a second dose.  Pneumococcal 13-valent conjugate (PCV13) vaccine. One dose is recommended after age 68.  Pneumococcal polysaccharide (PPSV23) vaccine. One dose is recommended after age 68.  Meningococcal vaccine. You may need this if you have certain conditions.  Hepatitis A vaccine. You may need this if you have certain conditions or if you travel or work in places where you may be exposed to   hepatitis A.  Hepatitis B vaccine. You may need this if you have certain conditions or if you travel or work in places where you may be exposed to hepatitis B.  Haemophilus influenzae type b (Hib) vaccine. You may need this if you have certain conditions. Talk to your health care provider about which screenings and vaccines you need and how often you need  them. This information is not intended to replace advice given to you by your health care provider. Make sure you discuss any questions you have with your health care provider. Document Released: 12/10/2015 Document Revised: 08/02/2016 Document Reviewed: 09/14/2015 Elsevier Interactive Patient Education  2017 [employer]  

## 2017-04-13 NOTE — Telephone Encounter (Signed)
Order for cologuard faxed to [facility] °

## 2017-04-13 NOTE — Progress Notes (Signed)
Patient: Gabrielle Salinas, Female    DOB: 06-26-1951, 66 y.o.   MRN: [id number] Visit Date: 04/13/2017  Today's Provider: [name], MD   Chief Complaint  Patient presents with  . Annual Exam  . Hypertension  . Hyperlipidemia  . Depression  . Hyperglycemia   Subjective:    Annual physical Daralyn Bert is a 66 y.o. female. She feels fairly well. She reports exercising walks her dogs daily. She reports she is sleeping poorly.  -----------------------------------------------------------    Hypertension, follow-up:  BP Readings from Last 3 Encounters:  04/13/17 112/78  10/17/16 130/78  03/14/16 140/84    She was last seen for hypertension 03/14/2016.  BP at that visit was 140/84. Management since that visit includes; no changes.She reports good compliance with treatment. She is not having side effects.  She is exercising. She is adherent to low salt diet.   Outside blood pressures are . She is experiencing none.  Patient denies chest pain, chest pressure/discomfort, claudication, dyspnea, exertional chest pressure/discomfort, fatigue, irregular heart beat, lower extremity edema, near-syncope, orthopnea, palpitations, paroxysmal nocturnal dyspnea, syncope and tachypnea.   Cardiovascular risk factors include advanced age (older than 77 for men, 58 for women), dyslipidemia, hypertension and obesity (BMI >= 30 kg/m2).   ----------------------------------------------------------------    Lipid/Cholesterol, Follow-up:   Last seen for this 03/14/2016.  Management since that visit includes; no changes.  Last Lipid Panel:    Component Value Date/Time   CHOL 221 (H) 03/14/2016 1032   TRIG 246 (H) 03/14/2016 1032   HDL 62 03/14/2016 1032   CHOLHDL 3.6 03/14/2016 1032   LDLCALC 110 (H) 03/14/2016 1032    She reports good compliance with treatment. She is not having side effects.   Wt Readings from Last 3 Encounters:  04/13/17 183 lb (83 kg)  10/17/16  182 lb (82.6 kg)  03/14/16 180 lb (81.6 kg)    ----------------------------------------------------------------   Prediabetes From 03/14/2016-labs checked, co changes. Last A1c 5.8  Depressive disorder From 03/14/2016-started  Fluoxetine 20 mg cap.Doing well on current meeications.   Anxiety state From 09/28/2016-no changes.Occasional takes alprazolam for stress and occasionally take clorazepate to help sleep, but never combines medications.   Follow up osteoporosis Started on alendronate weekly after abnormal BMD May 2017 and states she is taking it consistently and tolerating well with no adverse effects.    Review of Systems  Constitutional: Negative.   HENT: Negative.   Eyes: Negative.   Respiratory: Negative.   Cardiovascular: Negative.   Gastrointestinal: Negative.   Endocrine: Negative.   Genitourinary: Negative.   Musculoskeletal: Positive for arthralgias.  Skin: Negative.   Allergic/Immunologic: Positive for environmental allergies.  Neurological: Negative.   Hematological: Negative.   Psychiatric/Behavioral: Negative.     Social History   Social History  . Marital status: Divorced    Spouse name: N/A  . Number of children: N/A  . Years of education: N/A   Occupational History  . Teacher    Social History Main Topics  . Smoking status: Never Smoker  . Smokeless tobacco: Never Used  . Alcohol use No  . Drug use: Unknown  . Sexual activity: Not on file   Other Topics Concern  . Not on file   Social History Narrative  . No narrative on file    History reviewed. No pertinent past medical history.   Patient Active Problem List   Diagnosis Date Noted  . Osteoporosis 04/20/2016  . GERD (gastroesophageal reflux disease) 03/07/2016  .  Anxiety state 03/07/2016  . Arthralgia 03/07/2016  . Right knee pain 03/07/2016  . Menopausal symptom 03/07/2016  . Contact dermatitis 03/07/2016  . Hemorrhage of gastrointestinal tract 10/17/2007  . Cervicalgia  10/17/2007  . HLD (hyperlipidemia) 03/21/2007  . Prediabetes 03/21/2007  . Allergic rhinitis due to pollen 11/27/2001  . Depressive disorder 11/27/2001  . Hypertension 11/27/1997    Past Surgical History:  Procedure Laterality Date  . BREAST BIOPSY Right <10 yrs ago   x2 benign results  . GI Bleed  2008  . RHINOPLASTY    . VAGINAL HYSTERECTOMY  1982   due to abnormal pap smears and menorrhagia    Her family history includes Diabetes in her father, sister, sister, and sister; Heart attack in her father; Lupus in her mother.      Current Outpatient Prescriptions:  .  alendronate (FOSAMAX) 70 MG tablet, TAKE 1 TABLET BY MOUTH ONCE A WEEK. TAKE WITH FULL GLASS OF WATER ON EMPTY STOMACH, Disp: 4 tablet, Rfl: 12 .  ALPRAZolam (XANAX) 0.25 MG tablet, TAKE 1 TO 2 TABLETS EVERY 4 HOURS AS NEEDED FOR ANXIETY (NOT TO EXCEED 4TABS PER DAY), Disp: 30 tablet, Rfl: 5 .  Calcium Carbonate-Vitamin D (CALCIUM 500 + D PO), Take 1 capsule by mouth daily., Disp: , Rfl:  .  cholecalciferol (VITAMIN D) 1000 units tablet, Take 1 tablet by mouth daily., Disp: , Rfl:  .  clorazepate (TRANXENE) 15 MG tablet, TAKE 1/2 TABLET BY MOUTH 3 TIMES A DAY AS NEEDED - NOT TO EXCEED 5 ADDITIONAL REFILLS UNTIL 02/06/17, Disp: 30 tablet, Rfl: 5 .  FLUoxetine (PROZAC) 20 MG capsule, Take 1 capsule (20 mg total) by mouth daily., Disp: 90 capsule, Rfl: 2 .  lisinopril-hydrochlorothiazide (PRINZIDE,ZESTORETIC) 10-12.5 MG tablet, TAKE 1 TABLET BY MOUTH EVERY DAY, Disp: 30 tablet, Rfl: 12 .  lovastatin (MEVACOR) 20 MG tablet, TAKE 1 TABLET BY MOUTH AT BEDTIME, Disp: 30 tablet, Rfl: 11 .  Misc Natural Products (JOINT SUPPORT PO), Take by mouth., Disp: , Rfl:  .  montelukast (SINGULAIR) 10 MG tablet, TAKE 1 TABLET (10 MG TOTAL) BY MOUTH DAILY. FOR ALLERGIES, Disp: 30 tablet, Rfl: 6 .  Multiple Vitamins-Minerals (MULTIVITAMIN ADULT PO), Take 1 tablet by mouth daily., Disp: , Rfl:  .  Omega-3 Fatty Acids (FISH OIL) 1000 MG CAPS, Take  2 capsules by mouth 2 (two) times daily., Disp: , Rfl:  .  triamcinolone cream (KENALOG) 0.5 %, Apply 1 application topically daily as needed., Disp: , Rfl:  .  TURMERIC PO, Take by mouth., Disp: , Rfl:  .  clorazepate (TRANXENE) 7.5 MG tablet, TAKE ONE TABLET 3 TIMES DAILY AS NEEDED, Disp: 30 tablet, Rfl: 3  Patient Care Team: Malva LimesFisher, Nickalus Thornsberry E, MD as PCP - General (Family Medicine) Linus SalmonsMcQueen, Chapman, MD (Otolaryngology)     Objective:   Vitals: BP 112/78 (BP Location: Left Arm, Patient Position: Sitting, Cuff Size: Large)   Pulse 80   Temp 97.6 F (36.4 C) (Oral)   Resp 16   Ht 5\' 2"  (1.575 m)   Wt 183 lb (83 kg)   LMP 11/27/1980   SpO2 96%   BMI 33.47 kg/m   Physical Exam   General Appearance:    Alert, cooperative, no distress, appears stated age  Head:    Normocephalic, without obvious abnormality, atraumatic  Eyes:    PERRL, conjunctiva/corneas clear, EOM's intact, fundi    benign, both eyes  Ears:    Normal TM's and external ear canals, both ears  Nose:  Nares normal, septum midline, mucosa normal, no drainage    or sinus tenderness  Throat:   Lips, mucosa, and tongue normal; teeth and gums normal  Neck:   Supple, symmetrical, trachea midline, no adenopathy;    thyroid:  no enlargement/tenderness/nodules; no carotid   bruit or JVD  Back:     Symmetric, no curvature, ROM normal, no CVA tenderness  Lungs:     Clear to auscultation bilaterally, respirations unlabored  Chest Wall:    No tenderness or deformity   Heart:    Regular rate and rhythm, S1 and S2 normal, no murmur, rub   or gallop  Breast Exam:    deferred  Abdomen:     Soft, non-tender, bowel sounds active all four quadrants,    no masses, no organomegaly  Pelvic:    deferred  Extremities:   Extremities normal, atraumatic, no cyanosis or edema  Pulses:   2+ and symmetric all extremities  Skin:   Skin color, texture, turgor normal, no rashes or lesions  Lymph nodes:   Cervical, supraclavicular, and  axillary nodes normal  Neurologic:   CNII-XII intact, normal strength, sensation and reflexes    throughout    Activities of Daily Living In your present state of health, do you have any difficulty performing the following activities: 04/13/2017  Hearing? N  Vision? N  Difficulty concentrating or making decisions? N  Walking or climbing stairs? Y  Dressing or bathing? N  Doing errands, shopping? N  Some recent data might be hidden    Fall Risk Assessment Fall Risk  04/13/2017 03/14/2016  Falls in the past year? Yes Yes  Number falls in past yr: 1 1  Injury with Fall? Yes No     Depression Screen PHQ 2/9 Scores 04/13/2017 03/14/2016  PHQ - 2 Score 0 1  PHQ- 9 Score 3 7      Audit-C Alcohol Use Screening  Question Answer Points  How often do you have alcoholic drink? rarely times monthly   On days you do drink alcohol, how many drinks do you typically consume?    How oftey will you drink 6 or more in a total? never   Total Score:     A score of 3 or more in women, and 4 or more in men indicates increased risk for alcohol abuse, EXCEPT if all of the points are from question 1.     Assessment & Plan:     Annual Physical  Reviewed patient's Family Medical History Reviewed and updated list of patient's medical providers Assessment of cognitive impairment was done Assessed patient's functional ability Established a written schedule for health screening services Health Risk Assessent Completed and Reviewed  Exercise Activities and Dietary recommendations Goals    None      Immunization History  Administered Date(s) Administered  . Influenza, High Dose Seasonal PF 09/02/2016  . Pneumococcal Conjugate-13 07/30/2014  . Pneumococcal Polysaccharide-23 10/22/1998, 03/14/2016  . Td 07/23/1995  . Tdap 09/29/2011  . Zoster 07/30/2014    Health Maintenance  Topic Date Due  . INFLUENZA VACCINE  06/27/2017  . COLONOSCOPY  11/13/2017  . MAMMOGRAM  05/08/2018  .  TETANUS/TDAP  09/28/2021  . DEXA SCAN  Completed  . Hepatitis C Screening  Completed  . PNA vac Low Risk Adult  Completed     Discussed health benefits of physical activity, and encouraged her to engage in regular exercise appropriate for her age and condition.    --------------------------------------------------------------------------  1. Annual physical exam  Generally doing well.   2. Essential hypertension Well controlled.  Continue current medications.    3. Osteoporosis, unspecified osteoporosis type, unspecified pathological fracture presence Doing well since starting Alendronate 04/2017. Continue current medications.   - VITAMIN D 25 Hydroxy (Vit-D Deficiency, Fractures)  4. Anxiety state Doing well with occasional alprazolam.   5. Hyperlipidemia, unspecified hyperlipidemia type Diet controlled.  - Comprehensive metabolic panel - Lipid panel  6. Colon cancer screening Due for screening.  - Cologuard  7. Prediabetes  - Hemoglobin A1c  The entirety of the information documented in the History of Present Illness, Review of Systems and Physical Exam were personally obtained by me. Portions of this information were initially documented by [place] CMA and reviewed by me for thoroughness and accuracy.    [name], MD  [facility]

## 2017-04-14 LAB — VITAMIN D 25 HYDROXY (VIT D DEFICIENCY, FRACTURES): Vit D, 25-Hydroxy: 28.5 ng/mL — ABNORMAL LOW (ref 30.0–100.0)

## 2017-04-14 LAB — COMPREHENSIVE METABOLIC PANEL
ALT: 17 IU/L (ref 0–32)
AST: 17 IU/L (ref 0–40)
Albumin/Globulin Ratio: 1.5 (ref 1.2–2.2)
Albumin: 4 g/dL (ref 3.6–4.8)
Alkaline Phosphatase: 79 IU/L (ref 39–117)
BILIRUBIN TOTAL: 0.3 mg/dL (ref 0.0–1.2)
BUN/Creatinine Ratio: 30 — ABNORMAL HIGH (ref 12–28)
BUN: 25 mg/dL (ref 8–27)
CHLORIDE: 97 mmol/L (ref 96–106)
CO2: 28 mmol/L (ref 18–29)
Calcium: 9 mg/dL (ref 8.7–10.3)
Creatinine, Ser: 0.83 mg/dL (ref 0.57–1.00)
GFR, EST AFRICAN AMERICAN: 84 mL/min/{1.73_m2} (ref >59)
GFR, EST NON AFRICAN AMERICAN: 73 mL/min/{1.73_m2} (ref >59)
GLOBULIN, TOTAL: 2.6 g/dL (ref 1.5–4.5)
Glucose: 112 mg/dL — ABNORMAL HIGH (ref 65–99)
Potassium: 5.1 mmol/L (ref 3.5–5.2)
SODIUM: 138 mmol/L (ref 134–144)
TOTAL PROTEIN: 6.6 g/dL (ref 6.0–8.5)

## 2017-04-14 LAB — LIPID PANEL
CHOL/HDL RATIO: 3.1 ratio (ref 0.0–4.4)
Cholesterol, Total: 201 mg/dL — ABNORMAL HIGH (ref 100–199)
HDL: 65 mg/dL (ref >39)
LDL Calculated: 104 mg/dL — ABNORMAL HIGH (ref 0–99)
Triglycerides: 161 mg/dL — ABNORMAL HIGH (ref 0–149)
VLDL CHOLESTEROL CAL: 32 mg/dL (ref 5–40)

## 2017-04-14 LAB — HEMOGLOBIN A1C
Est. average glucose Bld gHb Est-mCnc: 123 mg/dL
Hgb A1c MFr Bld: 5.9 % — ABNORMAL HIGH (ref 4.8–5.6)

## 2017-04-30 DIAGNOSIS — Z1212 Encounter for screening for malignant neoplasm of rectum: Secondary | ICD-10-CM | POA: Diagnosis not present

## 2017-04-30 DIAGNOSIS — Z1211 Encounter for screening for malignant neoplasm of colon: Secondary | ICD-10-CM | POA: Diagnosis not present

## 2017-05-02 LAB — COLOGUARD: COLOGUARD: NEGATIVE

## 2017-05-24 ENCOUNTER — Other Ambulatory Visit: Payer: Self-pay | Admitting: Family Medicine

## 2017-05-24 MED ORDER — ALENDRONATE SODIUM 70 MG PO TABS: ORAL_TABLET | ORAL | 12 refills | Status: DC

## 2017-05-24 MED ORDER — LISINOPRIL-HYDROCHLOROTHIAZIDE 10-12.5 MG PO TABS: 1.0000 | ORAL_TABLET | Freq: Every day | ORAL | 12 refills | Status: DC

## 2017-05-24 NOTE — Telephone Encounter (Addendum)
[facility] faxed a 90-days supply for the following medication. Thanks CC  alendronate (FOSAMAX) 70 MG tablet  >Take 1 tablet by mouth once a week. Take with full glass of water on empty stomach.   lisinopril-hydrochlorothiazide (PRINZIDE,ZESTORETIC) 10-12.5 MG tablet  >Take 1 tablet by mouth every day

## 2017-05-25 ENCOUNTER — Other Ambulatory Visit: Payer: Self-pay | Admitting: Family Medicine

## 2017-05-25 MED ORDER — LISINOPRIL-HYDROCHLOROTHIAZIDE 10-12.5 MG PO TABS: 1.0000 | ORAL_TABLET | Freq: Every day | ORAL | 3 refills | Status: DC

## 2017-05-25 MED ORDER — ALENDRONATE SODIUM 70 MG PO TABS: ORAL_TABLET | ORAL | 3 refills | Status: DC

## 2017-05-25 NOTE — Telephone Encounter (Signed)
[facility] [address] faxed refill request for the following medications:  1. lisinopril-hydrochlorothiazide (PRINZIDE,ZESTORETIC) 10-12.5 MG tablet 2. alendronate (FOSAMAX) 70 MG tablet   Both medications for 90 day supply. The Rx that was sent 05/24/17 was a 30 day supply and they are requesting 90 day supply. Please advise. Thanks TNP

## 2017-05-29 ENCOUNTER — Telehealth: Payer: Self-pay | Admitting: Family Medicine

## 2017-05-29 DIAGNOSIS — R928 Other abnormal and inconclusive findings on diagnostic imaging of breast: Secondary | ICD-10-CM

## 2017-05-29 NOTE — Telephone Encounter (Signed)
-----   Message from Malva Limesonald E Verne Cove, MD sent at 04/18/2017  1:28 PM EDT ----- Regarding: FW: repeat diagnostic mammogram May 2018   ----- Message ----- From: Malva LimesFisher, Boluwatife Mutchler E, MD Sent: 04/16/2017 To: Malva Limesonald E Handy Mcloud, MD Subject: FW: repeat diagnostic mammogram May 2018       Bilateral diagnostic mammogram is recommended in May 2018 to complete a full year follow-up.  ----- Message ----- From: Malva Limesonald E Sanaz Scarlett, MD Sent: 12/13/2016 To: Malva Limesonald E Ravleen Ries, MD Subject: FW: repeat diagnostic mammogram 10-2016.SCHE#    ----- Message ----- From: Malva Limesonald E Tramayne Sebesta, MD Sent: 12/08/2016 To: Malva Limesonald E Scott Fix, MD Subject: FW: repeat diagnostic mammogram 10-2016.SCHE#    ----- Message ----- From: Malva Limesonald E Delaila Nand, MD Sent: 11/06/2016 To: Malva Limesonald E Janiene Aarons, MD Subject: repeat diagnostic mammogram 10-2016              ----- Message -----    From: Malva Limesonald E Jessicalynn Deshong, MD    Sent: 05/09/2016      To: Malva Limesonald E Laneya Gasaway, MD Subject: Annell GreeningFW: diagnostic mamogram scheduled 6-12           ----- Message -----    From: Malva Limesonald E Nicollette Wilhelmi, MD    Sent: 05/08/2016      To: Malva Limesonald E Ellamay Fors, MD Subject: diagnostic mamogram scheduled 6-12

## 2017-05-29 NOTE — Telephone Encounter (Signed)
Please order and advise patient she is overdue for bilateral diagnostic mammogram for follow up:  Per report on 12/08/16  RECOMMENDATION: Bilateral diagnostic mammogram is recommended in May 2018 to complete a full year follow-up.  I have discussed the findings and recommendations with the patient. Results were also provided in writing at the conclusion of the visit. If applicable, a reminder letter will be sent to the patient regarding the next appointment.  BI-RADS CATEGORY  2: Benign.   Electronically Signed   By: Britta MccreedySusan  Turner M.D.   On: 12/08/2016 11:42

## 2017-05-29 NOTE — Telephone Encounter (Signed)
LMOVM for pt to return call. Diagnostic mm ordered.

## 2017-06-01 NOTE — Telephone Encounter (Signed)
Tried calling patient. Left message to call back.  Appointment has been scheduled for 07/02/2017. Need to make sure patient is aware of this appointment.

## 2017-06-04 NOTE — Telephone Encounter (Signed)
Tried calling patient. Left message to call back. 

## 2017-06-05 ENCOUNTER — Other Ambulatory Visit: Payer: Medicare Other

## 2017-06-05 NOTE — Telephone Encounter (Signed)
Patient is aware of appointment date.

## 2017-06-08 DIAGNOSIS — M1712 Unilateral primary osteoarthritis, left knee: Secondary | ICD-10-CM | POA: Diagnosis not present

## 2017-07-01 ENCOUNTER — Other Ambulatory Visit: Payer: Self-pay | Admitting: Family Medicine

## 2017-07-01 DIAGNOSIS — F411 Generalized anxiety disorder: Secondary | ICD-10-CM

## 2017-07-02 ENCOUNTER — Ambulatory Visit
Admission: RE | Admit: 2017-07-02 | Discharge: 2017-07-02 | Disposition: A | Discharge status: Home / Self Care | Payer: Medicare Other | Source: Ambulatory Visit | Attending: Family Medicine | Admitting: Family Medicine

## 2017-07-02 ENCOUNTER — Other Ambulatory Visit: Payer: Self-pay | Admitting: Family Medicine

## 2017-07-02 DIAGNOSIS — R928 Other abnormal and inconclusive findings on diagnostic imaging of breast: Secondary | ICD-10-CM

## 2017-07-15 ENCOUNTER — Other Ambulatory Visit: Payer: Self-pay | Admitting: Family Medicine

## 2017-07-16 NOTE — Telephone Encounter (Signed)
Please call in alprazolam.  

## 2017-09-28 ENCOUNTER — Ambulatory Visit: Payer: Medicare Other

## 2017-09-28 ENCOUNTER — Other Ambulatory Visit: Payer: Self-pay | Admitting: Family Medicine

## 2017-09-28 ENCOUNTER — Ambulatory Visit (INDEPENDENT_AMBULATORY_CARE_PROVIDER_SITE_OTHER): Payer: Medicare Other

## 2017-09-28 VITALS — BP 152/94 | HR 72 | Temp 98.1°F | Ht 62.0 in | Wt 183.8 lb

## 2017-09-28 DIAGNOSIS — Z0000 Encounter for general adult medical examination without abnormal findings: Secondary | ICD-10-CM

## 2017-09-28 DIAGNOSIS — Z23 Encounter for immunization: Secondary | ICD-10-CM | POA: Diagnosis not present

## 2017-09-28 NOTE — Patient Instructions (Signed)
Gabrielle Salinas , Thank you for taking time to come for your Medicare Wellness Visit. I appreciate your ongoing commitment to your health goals. Please review the following plan we discussed and let me know if I can assist you in the future.   Screening recommendations/referrals: Colonoscopy: Up to date Mammogram: Up to date Bone Density: Up to date Recommended yearly ophthalmology/optometry visit for glaucoma screening and checkup Recommended yearly dental visit for hygiene and checkup  Vaccinations: Influenza vaccine: completed Pneumococcal vaccine: completed series Tdap vaccine: Up to date Shingles vaccine: completed 07/30/14  Advanced directives: Please bring a copy of your POA (Power of [place]) and/or Living Will to your next appointment.   Conditions/risks identified: Fall risk prevention; Obesity- recommend eating 3 meals a day with 2 healthy snacks in between.   Next appointment: Declined   Preventive Care 65 Years and Older, Female Preventive care refers to lifestyle choices and visits with your health care provider that can promote health and wellness. What does preventive care include?  A yearly physical exam. This is also called an annual well check.  Dental exams once or twice a year.  Routine eye exams. Ask your health care provider how often you should have your eyes checked.  Personal lifestyle choices, including:  Daily care of your teeth and gums.  Regular physical activity.  Eating a healthy diet.  Avoiding tobacco and drug use.  Limiting alcohol use.  Practicing safe sex.  Taking low-dose aspirin every day.  Taking vitamin and mineral supplements as recommended by your health care provider. What happens during an annual well check? The services and screenings done by your health care provider during your annual well check will depend on your age, overall health, lifestyle risk factors, and family history of disease. Counseling  Your health care  provider may ask you questions about your:  Alcohol use.  Tobacco use.  Drug use.  Emotional well-being.  Home and relationship well-being.  Sexual activity.  Eating habits.  History of falls.  Memory and ability to understand (cognition).  Work and work Astronomerenvironment.  Reproductive health. Screening  You may have the following tests or measurements:  Height, weight, and BMI.  Blood pressure.  Lipid and cholesterol levels. These may be checked every 5 years, or more frequently if you are over 68 years old.  Skin check.  Lung cancer screening. You may have this screening every year starting at age 68 if you have a 30-pack-year history of smoking and currently smoke or have quit within the past 15 years.  Fecal occult blood test (FOBT) of the stool. You may have this test every year starting at age 68.  Flexible sigmoidoscopy or colonoscopy. You may have a sigmoidoscopy every 5 years or a colonoscopy every 10 years starting at age 68.  Hepatitis C blood test.  Hepatitis B blood test.  Sexually transmitted disease (STD) testing.  Diabetes screening. This is done by checking your blood sugar (glucose) after you have not eaten for a while (fasting). You may have this done every 1-3 years.  Bone density scan. This is done to screen for osteoporosis. You may have this done starting at age 68.  Mammogram. This may be done every 1-2 years. Talk to your health care provider about how often you should have regular mammograms. Talk with your health care provider about your test results, treatment options, and if necessary, the need for more tests. Vaccines  Your health care provider may recommend certain vaccines, such as:  Influenza  vaccine. This is recommended every year.  Tetanus, diphtheria, and acellular pertussis (Tdap, Td) vaccine. You may need a Td booster every 10 years.  Zoster vaccine. You may need this after age 57.  Pneumococcal 13-valent conjugate (PCV13)  vaccine. One dose is recommended after age 29.  Pneumococcal polysaccharide (PPSV23) vaccine. One dose is recommended after age 40. Talk to your health care provider about which screenings and vaccines you need and how often you need them. This information is not intended to replace advice given to you by your health care provider. Make sure you discuss any questions you have with your health care provider. Document Released: 12/10/2015 Document Revised: 08/02/2016 Document Reviewed: 09/14/2015 Elsevier Interactive Patient Education  2017 [place] Prevention in the Home Falls can cause injuries. They can happen to people of all ages. There are many things you can do to make your home safe and to help prevent falls. What can I do on the outside of my home?  Regularly fix the edges of walkways and driveways and fix any cracks.  Remove anything that might make you trip as you walk through a door, such as a raised step or threshold.  Trim any bushes or trees on the path to your home.  Use bright outdoor lighting.  Clear any walking paths of anything that might make someone trip, such as rocks or tools.  Regularly check to see if handrails are loose or broken. Make sure that both sides of any steps have handrails.  Any raised decks and porches should have guardrails on the edges.  Have any leaves, snow, or ice cleared regularly.  Use sand or salt on walking paths during winter.  Clean up any spills in your garage right away. This includes oil or grease spills. What can I do in the bathroom?  Use night lights.  Install grab bars by the toilet and in the tub and shower. Do not use towel bars as grab bars.  Use non-skid mats or decals in the tub or shower.  If you need to sit down in the shower, use a plastic, non-slip stool.  Keep the floor dry. Clean up any water that spills on the floor as soon as it happens.  Remove soap buildup in the tub or shower  regularly.  Attach bath mats securely with double-sided non-slip rug tape.  Do not have throw rugs and other things on the floor that can make you trip. What can I do in the bedroom?  Use night lights.  Make sure that you have a light by your bed that is easy to reach.  Do not use any sheets or blankets that are too big for your bed. They should not hang down onto the floor.  Have a firm chair that has side arms. You can use this for support while you get dressed.  Do not have throw rugs and other things on the floor that can make you trip. What can I do in the kitchen?  Clean up any spills right away.  Avoid walking on wet floors.  Keep items that you use a lot in easy-to-reach places.  If you need to reach something above you, use a strong step stool that has a grab bar.  Keep electrical cords out of the way.  Do not use floor polish or wax that makes floors slippery. If you must use wax, use non-skid floor wax.  Do not have throw rugs and other things on the floor that can  make you trip. What can I do with my stairs?  Do not leave any items on the stairs.  Make sure that there are handrails on both sides of the stairs and use them. Fix handrails that are broken or loose. Make sure that handrails are as long as the stairways.  Check any carpeting to make sure that it is firmly attached to the stairs. Fix any carpet that is loose or worn.  Avoid having throw rugs at the top or bottom of the stairs. If you do have throw rugs, attach them to the floor with carpet tape.  Make sure that you have a light switch at the top of the stairs and the bottom of the stairs. If you do not have them, ask someone to add them for you. What else can I do to help prevent falls?  Wear shoes that:  Do not have high heels.  Have rubber bottoms.  Are comfortable and fit you well.  Are closed at the toe. Do not wear sandals.  If you use a stepladder:  Make sure that it is fully  opened. Do not climb a closed stepladder.  Make sure that both sides of the stepladder are locked into place.  Ask someone to hold it for you, if possible.  Clearly mark and make sure that you can see:  Any grab bars or handrails.  First and last steps.  Where the edge of each step is.  Use tools that help you move around (mobility aids) if they are needed. These include:  Canes.  Walkers.  Scooters.  Crutches.  Turn on the lights when you go into a dark area. Replace any light bulbs as soon as they burn out.  Set up your furniture so you have a clear path. Avoid moving your furniture around.  If any of your floors are uneven, fix them.  If there are any pets around you, be aware of where they are.  Review your medicines with your doctor. Some medicines can make you feel dizzy. This can increase your chance of falling. Ask your doctor what other things that you can do to help prevent falls. This information is not intended to replace advice given to you by your health care provider. Make sure you discuss any questions you have with your health care provider. Document Released: 09/09/2009 Document Revised: 04/20/2016 Document Reviewed: 12/18/2014 Elsevier Interactive Patient Education  2017 [employer].

## 2017-09-28 NOTE — Progress Notes (Signed)
Subjective:   Gabrielle Salinas is a 66 y.o. female who presents for an Initial Medicare Annual Wellness Visit.  Review of Systems    N/A  Cardiac Risk Factors include: advanced age (>3men, >61 women);diabetes mellitus;obesity (BMI >30kg/m2);hypertension     Objective:    Today's Vitals   09/28/17 1320 09/28/17 1353  BP: (!) 158/82 (!) 152/94  Pulse: 72   Temp: 98.1 F (36.7 C)   TempSrc: Oral   Weight: 183 lb 12.8 oz (83.4 kg)   Height: 5\' 2"  (1.575 m)   PainSc: 0-No pain    Body mass index is 33.62 kg/m.   Current Medications (verified) Outpatient Encounter Prescriptions as of 09/28/2017  Medication Sig  . alendronate (FOSAMAX) 70 MG tablet TAKE 1 TABLET BY MOUTH ONCE A WEEK. TAKE WITH FULL GLASS OF WATER ON EMPTY STOMACH  . ALPRAZolam (XANAX) 0.25 MG tablet TAKE 1 TO 2 TABLETS BY MOUTH EVERY 4 HOURS IF NEEDED FOR ANXIETY *NOT TO EXCEED 4 PER DAY*  . Calcium Carbonate-Vitamin D (CALCIUM 500 + D PO) Take 1 capsule by mouth daily.  . cholecalciferol (VITAMIN D) 1000 units tablet Take 1 tablet by mouth daily.  . clorazepate (TRANXENE) 15 MG tablet TAKE 1/2 TABLET BY MOUTH 3 TIMES A DAY AS NEEDED - NOT TO EXCEED 5 ADDITIONAL REFILLS UNTIL 02/06/17 (Patient taking differently: 1/2 tablet at bedtime as needed)  . FLUoxetine (PROZAC) 20 MG capsule TAKE 1 CAPSULE (20 MG TOTAL) BY MOUTH DAILY.  Marland Kitchen lisinopril-hydrochlorothiazide (PRINZIDE,ZESTORETIC) 10-12.5 MG tablet Take 1 tablet by mouth daily.  Marland Kitchen lovastatin (MEVACOR) 20 MG tablet TAKE 1 TABLET BY MOUTH AT BEDTIME  . Misc Natural Products (JOINT SUPPORT PO) Take by mouth.  . montelukast (SINGULAIR) 10 MG tablet TAKE 1 TABLET (10 MG TOTAL) BY MOUTH DAILY. FOR ALLERGIES  . Multiple Vitamins-Minerals (MULTIVITAMIN ADULT PO) Take 1 tablet by mouth daily.  . Omega-3 Fatty Acids (FISH OIL) 1000 MG CAPS Take 1 capsule by mouth daily.   . TURMERIC PO Take by mouth.  . triamcinolone cream (KENALOG) 0.5 % Apply 1 application topically  daily as needed.  . [DISCONTINUED] clorazepate (TRANXENE) 7.5 MG tablet TAKE ONE TABLET 3 TIMES DAILY AS NEEDED (Patient not taking: Reported on 04/13/2017)   No facility-administered encounter medications on file as of 09/28/2017.     Allergies (verified) Bactrim  [sulfamethoxazole-trimethoprim]; Daypro  [oxaprozin]; and Sulfa antibiotics   History: History reviewed. No pertinent past medical history. Past Surgical History:  Procedure Laterality Date  . BREAST BIOPSY Right <10 yrs ago   x2 benign results  . GI Bleed  2008  . RHINOPLASTY    . VAGINAL HYSTERECTOMY  1982   due to abnormal pap smears and menorrhagia   Family History  Problem Relation Age of Onset  . Lupus Mother   . Heart attack Father   . Diabetes Father   . Diabetes Sister   . Diabetes Sister   . Diabetes Sister   . Breast cancer Neg Hx    Social History   Occupational History  . Teacher    Social History Main Topics  . Smoking status: Never Smoker  . Smokeless tobacco: Never Used  . Alcohol use 0.0 oz/week     Comment: 1-2 glasses of wine a month / occasional  . Drug use: Unknown  . Sexual activity: Not on file    Tobacco Counseling Counseling given: Not Answered   Activities of Daily Living In your present state of health, do you have  any difficulty performing the following activities: 09/28/2017 04/13/2017  Hearing? N N  Vision? N N  Difficulty concentrating or making decisions? N N  Walking or climbing stairs? N Y  Dressing or bathing? N N  Doing errands, shopping? N N  Preparing Food and eating ? N -  Using the Toilet? N -  In the past six months, have you accidently leaked urine? Y -  Comment occasionally when coughing -  Do you have problems with loss of bowel control? N -  Managing your Medications? N -  Managing your Finances? N -  Housekeeping or managing your Housekeeping? N -  Some recent data might be hidden    Immunizations and Health Maintenance Immunization History    Administered Date(s) Administered  . Influenza, High Dose Seasonal PF 09/02/2016, 09/28/2017  . Pneumococcal Conjugate-13 07/30/2014  . Pneumococcal Polysaccharide-23 10/22/1998, 03/14/2016  . Td 07/23/1995  . Tdap 09/29/2011  . Zoster 07/30/2014   There are no preventive care reminders to display for this patient.  Patient Care Team: Malva LimesFisher, Donald E, MD as PCP - General (Family Medicine) Patty, A. Azucena Kubaeid, MD as Consulting Physician (Ophthalmology) Juanell FairlyKrasinski, Kevin, MD as Referring Physician (Orthopedic Surgery)  Indicate any recent Medical Services you may have received from other than [facility] providers in the past year (date may be approximate).     Assessment:   This is a routine wellness examination for Gabrielle Salinas.   Hearing/Vision screen Vision Screening Comments: Pt sees Dr Alexia FreestonePatty for vision checks yearly.   Dietary issues and exercise activities discussed: Current Exercise Habits: Home exercise routine, Type of exercise: stretching;walking, Time (Minutes): 30, Frequency (Times/Week): 2 (to 3 days), Weekly Exercise (Minutes/Week): 60, Intensity: Mild  Goals    . Have 3 meals a day          Recommend eating 3 meals a day with 2 healthy snacks in between.       Depression Screen PHQ 2/9 Scores 09/28/2017 04/13/2017 03/14/2016  PHQ - 2 Score 0 0 1  PHQ- 9 Score - 3 7    Fall Risk Fall Risk  09/28/2017 04/13/2017 03/14/2016  Falls in the past year? Yes Yes Yes  Number falls in past yr: 2 or more 1 1  Comment knee gave out - -  Injury with Fall? No Yes No  Follow up Falls prevention discussed - -    Cognitive Function:     6CIT Screen 09/28/2017  What Year? 0 points  What month? 0 points  What time? 0 points  Count back from 20 0 points  Months in reverse 0 points  Repeat phrase 2 points  Total Score 2    Screening Tests Health Maintenance  Topic Date Due  . COLONOSCOPY  11/13/2017  . MAMMOGRAM  07/03/2019  . TETANUS/TDAP  09/28/2021  . INFLUENZA VACCINE   Completed  . DEXA SCAN  Completed  . Hepatitis C Screening  Completed  . PNA vac Low Risk Adult  Completed      Plan:  I have personally reviewed and addressed the Medicare Annual Wellness questionnaire and have noted the following in the patient's chart:  A. Medical and social history B. Use of alcohol, tobacco or illicit drugs  C. Current medications and supplements D. Functional ability and status E.  Nutritional status F.  Physical activity G. Advance directives H. List of other physicians I.  Hospitalizations, surgeries, and ER visits in previous 12 months J.  Vitals K. Screenings such as hearing and vision if needed,  cognitive and depression L. Referrals and appointments - none  In addition, I have reviewed and discussed with patient certain preventive protocols, quality metrics, and best practice recommendations. A written personalized care plan for preventive services as well as general preventive health recommendations were provided to patient.  See attached scanned questionnaire for additional information.   Signed,  Hyacinth Meeker, LPN Nurse Health Advisor   MD Recommendations: None. Advised pt to monitor BP readings.

## 2017-10-11 ENCOUNTER — Telehealth: Payer: Self-pay | Admitting: Family Medicine

## 2017-10-11 NOTE — Telephone Encounter (Signed)
Returned call to pt. Advised she is up to date on pneumonia vaccines.

## 2017-10-11 NOTE — Telephone Encounter (Signed)
Pt is asking if she is due for the pneumonia vaccine. Please advise. Thanks TNP

## 2017-10-27 HISTORY — PX: UPPER GASTROINTESTINAL ENDOSCOPY: SHX188

## 2017-11-06 ENCOUNTER — Other Ambulatory Visit: Payer: Self-pay | Admitting: Family Medicine

## 2017-11-16 ENCOUNTER — Other Ambulatory Visit: Payer: Self-pay | Admitting: Family Medicine

## 2017-11-17 DIAGNOSIS — K269 Duodenal ulcer, unspecified as acute or chronic, without hemorrhage or perforation: Secondary | ICD-10-CM | POA: Diagnosis not present

## 2017-11-17 DIAGNOSIS — Z882 Allergy status to sulfonamides status: Secondary | ICD-10-CM | POA: Diagnosis not present

## 2017-11-17 DIAGNOSIS — E7800 Pure hypercholesterolemia, unspecified: Secondary | ICD-10-CM | POA: Diagnosis not present

## 2017-11-17 DIAGNOSIS — K922 Gastrointestinal hemorrhage, unspecified: Secondary | ICD-10-CM | POA: Diagnosis not present

## 2017-11-17 DIAGNOSIS — R197 Diarrhea, unspecified: Secondary | ICD-10-CM | POA: Diagnosis not present

## 2017-11-17 DIAGNOSIS — K226 Gastro-esophageal laceration-hemorrhage syndrome: Secondary | ICD-10-CM | POA: Diagnosis not present

## 2017-11-17 DIAGNOSIS — K219 Gastro-esophageal reflux disease without esophagitis: Secondary | ICD-10-CM | POA: Diagnosis not present

## 2017-11-17 DIAGNOSIS — K259 Gastric ulcer, unspecified as acute or chronic, without hemorrhage or perforation: Secondary | ICD-10-CM | POA: Diagnosis not present

## 2017-11-17 DIAGNOSIS — Z7983 Long term (current) use of bisphosphonates: Secondary | ICD-10-CM | POA: Diagnosis not present

## 2017-11-17 DIAGNOSIS — J301 Allergic rhinitis due to pollen: Secondary | ICD-10-CM | POA: Diagnosis not present

## 2017-11-17 DIAGNOSIS — D649 Anemia, unspecified: Secondary | ICD-10-CM | POA: Diagnosis not present

## 2017-11-17 DIAGNOSIS — R000 Tachycardia, unspecified: Secondary | ICD-10-CM | POA: Diagnosis not present

## 2017-11-17 DIAGNOSIS — D62 Acute posthemorrhagic anemia: Secondary | ICD-10-CM | POA: Diagnosis not present

## 2017-11-17 DIAGNOSIS — K921 Melena: Secondary | ICD-10-CM | POA: Diagnosis not present

## 2017-11-17 DIAGNOSIS — K644 Residual hemorrhoidal skin tags: Secondary | ICD-10-CM | POA: Diagnosis not present

## 2017-11-17 DIAGNOSIS — Z79899 Other long term (current) drug therapy: Secondary | ICD-10-CM | POA: Diagnosis not present

## 2017-11-17 DIAGNOSIS — Z791 Long term (current) use of non-steroidal anti-inflammatories (NSAID): Secondary | ICD-10-CM | POA: Diagnosis not present

## 2017-11-17 DIAGNOSIS — I10 Essential (primary) hypertension: Secondary | ICD-10-CM | POA: Diagnosis not present

## 2017-11-17 DIAGNOSIS — E785 Hyperlipidemia, unspecified: Secondary | ICD-10-CM | POA: Diagnosis not present

## 2017-11-17 DIAGNOSIS — R9431 Abnormal electrocardiogram [ECG] [EKG]: Secondary | ICD-10-CM | POA: Diagnosis not present

## 2017-11-17 DIAGNOSIS — R51 Headache: Secondary | ICD-10-CM | POA: Diagnosis not present

## 2017-11-19 HISTORY — PX: COLONOSCOPY: SHX174

## 2017-11-19 MED ORDER — LOVASTATIN 40 MG PO TABS
20.00 mg | ORAL_TABLET | ORAL | Status: DC
Start: 2017-11-19 — End: 2017-11-19

## 2017-11-19 MED ORDER — DICLOFENAC SODIUM 1 % TD GEL
2.00 | TRANSDERMAL | Status: DC
Start: 2017-11-19 — End: 2017-11-19

## 2017-11-19 MED ORDER — GENERIC EXTERNAL MEDICATION
30.00 | Status: DC
Start: ? — End: 2017-11-19

## 2017-11-19 MED ORDER — LIDOCAINE 5 % EX PTCH
1.00 | MEDICATED_PATCH | CUTANEOUS | Status: DC
Start: 2017-11-20 — End: 2017-11-19

## 2017-11-19 MED ORDER — SODIUM CHLORIDE 0.9 % IV SOLN
100.00 | INTRAVENOUS | Status: DC
Start: ? — End: 2017-11-19

## 2017-11-19 MED ORDER — ALPRAZOLAM 0.5 MG PO TABS
0.25 mg | ORAL_TABLET | ORAL | Status: DC
Start: ? — End: 2017-11-19

## 2017-11-19 MED ORDER — LACTATED RINGERS IV SOLN
10.00 | INTRAVENOUS | Status: DC
Start: ? — End: 2017-11-19

## 2017-11-19 MED ORDER — PANTOPRAZOLE SODIUM 40 MG PO TBEC
40.00 mg | DELAYED_RELEASE_TABLET | ORAL | Status: DC
Start: ? — End: 2017-11-19

## 2017-11-19 MED ORDER — MELATONIN 3 MG PO TABS
6.00 mg | ORAL_TABLET | ORAL | Status: DC
Start: ? — End: 2017-11-19

## 2017-11-19 MED ORDER — MONTELUKAST SODIUM 10 MG PO TABS
10.00 mg | ORAL_TABLET | ORAL | Status: DC
Start: 2017-11-19 — End: 2017-11-19

## 2017-11-19 MED ORDER — ACETAMINOPHEN 325 MG PO TABS
650.00 mg | ORAL_TABLET | ORAL | Status: DC
Start: ? — End: 2017-11-19

## 2017-11-19 MED ORDER — FLUOXETINE HCL 20 MG PO CAPS
20.00 mg | ORAL_CAPSULE | ORAL | Status: DC
Start: 2017-11-20 — End: 2017-11-19

## 2017-11-30 ENCOUNTER — Encounter: Payer: Self-pay | Admitting: Family Medicine

## 2017-11-30 ENCOUNTER — Ambulatory Visit (INDEPENDENT_AMBULATORY_CARE_PROVIDER_SITE_OTHER): Payer: Medicare Other | Admitting: Family Medicine

## 2017-11-30 VITALS — BP 110/60 | HR 86 | Temp 98.2°F | Resp 16 | Wt 185.0 lb

## 2017-11-30 DIAGNOSIS — K920 Hematemesis: Secondary | ICD-10-CM

## 2017-11-30 NOTE — Progress Notes (Signed)
Patient: Gabrielle Salinas Female    DOB: 12/14/1950   67 y.o.   MRN: [id number] Visit Date: 11/30/2017  Today's Provider: [name], MD   Chief Complaint  Patient presents with  . Hospitalization Follow-up   Subjective:    HPI   Follow up Hospitalization  Patient was admitted to [facility] on 11/17/2017 and discharged on 11/19/2017. She states she presented with fatigued and upset stomach, followed by diarrhea and blood stool.  She was treated for Upper GI Bleed and Anemia  hct dropped to 6.8 requiring PRBC transfusion.  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 Normal colonoscopy.  Discharged on BID PPI.  She reports good compliance with treatment. She reports this condition is Improved. Is scheduled to follow up with GI on 01/11/2018 She states she had taken Advil for Aleve for several days.  Patient states she has had some shortness of breath and fatigue.    Allergies  Allergen Reactions  . Bactrim  [Sulfamethoxazole-Trimethoprim]   . Daypro  [Oxaprozin]   . Sulfa Antibiotics      Current Outpatient Medications:  .  acetaminophen (TYLENOL) 325 MG tablet, Take 650 mg by mouth every 4 (four) hours as needed., Disp: , Rfl:  .  alendronate (FOSAMAX) 70 MG tablet, TAKE 1 TABLET BY MOUTH ONCE A WEEK. TAKE WITH FULL GLASS OF WATER ON EMPTY STOMACH, Disp: 12 tablet, Rfl: 3 .  ALPRAZolam (XANAX) 0.25 MG tablet, TAKE 1 TO 2 TABLETS BY MOUTH EVERY 4 HOURS AS NEEDED FOR ANXIETY (NOT TO EXCEED 4 PER DAY) (Patient taking differently: TAKE 1 TABLETS BY MOUTH 3 times daily for sleep), Disp: 30 tablet, Rfl: 5 .  Calcium Carb-Cholecalciferol (CALCIUM PLUS VITAMIN D3) 600-500 MG-UNIT CAPS, Take 600 Units by mouth daily., Disp: , Rfl:  .  cholecalciferol (VITAMIN D) 1000 units tablet, Take 1 tablet by mouth daily., Disp: , Rfl:  .  clorazepate (TRANXENE) 15 MG tablet, TAKE 1/2 TABLET BY MOUTH 3 TIMES A DAY AS NEEDED, Disp: 30  tablet, Rfl: 4 .  diclofenac sodium (VOLTAREN) 1 % GEL, Apply 2 g topically 4 (four) times daily., Disp: , Rfl:  .  FLUoxetine (PROZAC) 20 MG capsule, TAKE 1 CAPSULE (20 MG TOTAL) BY MOUTH DAILY., Disp: 90 capsule, Rfl: 4 .  lidocaine (LIDODERM) 5 %, Place 1 patch onto the skin. Apply to affected area for 12 hours only each day, Disp: , Rfl:  .  lisinopril-hydrochlorothiazide (PRINZIDE,ZESTORETIC) 10-12.5 MG tablet, Take 1 tablet by mouth daily., Disp: 90 tablet, Rfl: 3 .  lovastatin (MEVACOR) 20 MG tablet, TAKE 1 TABLET BY MOUTH AT BEDTIME, Disp: 30 tablet, Rfl: 11 .  Misc Natural Products (JOINT SUPPORT PO), Take by mouth., Disp: , Rfl:  .  montelukast (SINGULAIR) 10 MG tablet, TAKE 1 TABLET (10 MG TOTAL) BY MOUTH DAILY. FOR ALLERGIES, Disp: 30 tablet, Rfl: 6 .  Multiple Vitamins-Minerals (MULTIVITAMIN ADULT PO), Take 1 tablet by mouth daily., Disp: , Rfl:  .  omega-3 acid ethyl esters (LOVAZA) 1 g capsule, Take 2 g by mouth 2 (two) times daily., Disp: , Rfl:  .  pantoprazole (PROTONIX) 40 MG tablet, Take 40 mg by mouth 2 (two) times daily., Disp: , Rfl:  .  triamcinolone cream (KENALOG) 0.5 %, Apply 1 application topically daily as needed., Disp: , Rfl:  .  TURMERIC PO, Take by mouth., Disp: , Rfl:  .  Calcium Carbonate-Vitamin D (CALCIUM 500 + D PO), Take  1 capsule by mouth daily., Disp: , Rfl:  .  Omega-3 Fatty Acids (FISH OIL) 1000 MG CAPS, Take 1 capsule by mouth daily. , Disp: , Rfl:   Review of Systems  Constitutional: Negative for appetite change, chills, fatigue and fever.  Respiratory: Positive for shortness of breath. Negative for chest tightness.   Cardiovascular: Negative for chest pain and palpitations.  Gastrointestinal: Negative for abdominal pain, nausea and vomiting.  Neurological: Negative for dizziness and weakness.    Social History   Tobacco Use  . Smoking status: Never Smoker  . Smokeless tobacco: Never Used  Substance Use Topics  . Alcohol use: Yes     Alcohol/week: 0.0 oz    Comment: 1-2 glasses of wine a month / occasional   Objective:   BP 110/60 (BP Location: Right Arm, Patient Position: Sitting, Cuff Size: Normal)   Pulse 86   Temp 98.2 F (36.8 C) (Oral)   Resp 16   Wt 185 lb (83.9 kg)   LMP 11/27/1980   SpO2 98%   BMI 33.84 kg/m  Vitals:   11/30/17 1517  BP: 110/60  Pulse: 86  Resp: 16  Temp: 98.2 F (36.8 C)  TempSrc: Oral  SpO2: 98%  Weight: 185 lb (83.9 kg)     Physical Exam   General Appearance:    Alert, cooperative, no distress  Eyes:    PERRL, conjunctiva/corneas clear, EOM's intact       Lungs:     Clear to auscultation bilaterally, respirations unlabored  Heart:    Regular rate and rhythm  Neurologic:   Awake, alert, oriented x 3. No apparent focal neurological           defect.           Assessment & Plan:     1. Gastrointestinal hemorrhage with hematemesis Resolved. Still some fatigue and DOE. Check CBC to make sur is stable. If improved is to follow up with GI as scheduled.  - CBC       Mila Merryonald Fisher, MD  [facility]

## 2017-12-01 LAB — CBC
Hematocrit: 27.6 % — ABNORMAL LOW (ref 34.0–46.6)
Hemoglobin: 9 g/dL — ABNORMAL LOW (ref 11.1–15.9)
MCH: 30.3 pg (ref 26.6–33.0)
MCHC: 32.6 g/dL (ref 31.5–35.7)
MCV: 93 fL (ref 79–97)
Platelets: 382 10*3/uL — ABNORMAL HIGH (ref 150–379)
RBC: 2.97 x10E6/uL — ABNORMAL LOW (ref 3.77–5.28)
RDW: 15.5 % — AB (ref 12.3–15.4)
WBC: 6.8 10*3/uL (ref 3.4–10.8)

## 2018-01-03 ENCOUNTER — Encounter: Payer: Self-pay | Admitting: Physician Assistant

## 2018-01-03 ENCOUNTER — Ambulatory Visit (INDEPENDENT_AMBULATORY_CARE_PROVIDER_SITE_OTHER): Payer: Medicare Other | Admitting: Physician Assistant

## 2018-01-03 VITALS — BP 120/70 | HR 74 | Temp 98.5°F | Resp 20 | Wt 185.0 lb

## 2018-01-03 DIAGNOSIS — R059 Cough, unspecified: Secondary | ICD-10-CM

## 2018-01-03 DIAGNOSIS — R05 Cough: Secondary | ICD-10-CM

## 2018-01-03 DIAGNOSIS — J0100 Acute maxillary sinusitis, unspecified: Secondary | ICD-10-CM

## 2018-01-03 DIAGNOSIS — R6882 Decreased libido: Secondary | ICD-10-CM | POA: Diagnosis not present

## 2018-01-03 MED ORDER — BENZONATATE 200 MG PO CAPS: 200.0000 mg | ORAL_CAPSULE | Freq: Three times a day (TID) | ORAL | 0 refills | Status: DC | PRN

## 2018-01-03 MED ORDER — AMOXICILLIN-POT CLAVULANATE 875-125 MG PO TABS: 1.0000 | ORAL_TABLET | Freq: Two times a day (BID) | ORAL | 0 refills | Status: DC

## 2018-01-03 NOTE — Progress Notes (Signed)
Patient: Gabrielle Salinas Female    DOB: 1951/06/07   67 y.o.   MRN: [id number] Visit Date: 01/03/2018  Today's Provider: Margaretann LovelessJennifer M Burnette, PA-C   Chief Complaint  Patient presents with  . URI   Subjective:    HPI Upper Respiratory Infection: Patient complains of symptoms of a URI, possible sinusitis. Symptoms include congestion, cough and fever. Onset of symptoms was 4 days ago, gradually worsening since that time. She also c/o left ear pressure/pain, congestion and productive cough with  yellow colored sputum for the past 3 days .  She is drinking plenty of fluids. Evaluation to date: none. Treatment to date: antihistamines and decongestants. Allegra and Nyquil. Patient reports she has been around her son who has the same symptoms.   Patient C/O low libido for several months or years. Patient reports that she had a hysterectomy in her early 5030's. Patient reports she was using estropipate and has been off medication for some time. Patient wants to know if you would recommend restarting medication or does she need to go to GYN. Patient reports she is not comfortable talking to Dr. Sherrie MustacheFisher about this.     Allergies  Allergen Reactions  . Bactrim  [Sulfamethoxazole-Trimethoprim]   . Daypro  [Oxaprozin]   . Sulfa Antibiotics   . Sulfasalazine      Current Outpatient Medications:  .  acetaminophen (TYLENOL) 325 MG tablet, Take 650 mg by mouth every 4 (four) hours as needed., Disp: , Rfl:  .  alendronate (FOSAMAX) 70 MG tablet, TAKE 1 TABLET BY MOUTH ONCE A WEEK. TAKE WITH FULL GLASS OF WATER ON EMPTY STOMACH, Disp: 12 tablet, Rfl: 3 .  ALPRAZolam (XANAX) 0.25 MG tablet, TAKE 1 TO 2 TABLETS BY MOUTH EVERY 4 HOURS AS NEEDED FOR ANXIETY (NOT TO EXCEED 4 PER DAY) (Patient taking differently: TAKE 1 TABLETS BY MOUTH 3 times daily for sleep), Disp: 30 tablet, Rfl: 5 .  Calcium Carb-Cholecalciferol (CALCIUM PLUS VITAMIN D3) 600-500 MG-UNIT CAPS, Take 600 Units by mouth daily., Disp: ,  Rfl:  .  cholecalciferol (VITAMIN D) 1000 units tablet, Take 1 tablet by mouth daily., Disp: , Rfl:  .  clorazepate (TRANXENE) 15 MG tablet, TAKE 1/2 TABLET BY MOUTH 3 TIMES A DAY AS NEEDED, Disp: 30 tablet, Rfl: 4 .  diclofenac sodium (VOLTAREN) 1 % GEL, Apply 2 g topically 4 (four) times daily., Disp: , Rfl:  .  FLUoxetine (PROZAC) 20 MG capsule, TAKE 1 CAPSULE (20 MG TOTAL) BY MOUTH DAILY., Disp: 90 capsule, Rfl: 4 .  lisinopril-hydrochlorothiazide (PRINZIDE,ZESTORETIC) 10-12.5 MG tablet, Take 1 tablet by mouth daily., Disp: 90 tablet, Rfl: 3 .  lovastatin (MEVACOR) 20 MG tablet, TAKE 1 TABLET BY MOUTH AT BEDTIME, Disp: 30 tablet, Rfl: 11 .  Misc Natural Products (JOINT SUPPORT PO), Take by mouth., Disp: , Rfl:  .  montelukast (SINGULAIR) 10 MG tablet, TAKE 1 TABLET (10 MG TOTAL) BY MOUTH DAILY. FOR ALLERGIES, Disp: 30 tablet, Rfl: 6 .  Multiple Vitamins-Minerals (MULTIVITAMIN ADULT PO), Take 1 tablet by mouth daily., Disp: , Rfl:  .  Omega-3 Fatty Acids (FISH OIL) 1000 MG CAPS, Take 1 capsule by mouth daily. , Disp: , Rfl:  .  triamcinolone cream (KENALOG) 0.5 %, Apply 1 application topically daily as needed., Disp: , Rfl:  .  TURMERIC PO, Take by mouth., Disp: , Rfl:  .  omega-3 acid ethyl esters (LOVAZA) 1 g capsule, Take 2 g by mouth 2 (two) times daily., Disp: ,  Rfl:   Review of Systems  Constitutional: Positive for activity change and fatigue.  HENT: Positive for congestion, ear pain, postnasal drip, rhinorrhea, sinus pressure, sinus pain, sore throat and voice change.   Respiratory: Positive for cough, shortness of breath and wheezing.   Cardiovascular: Negative.     Social History   Tobacco Use  . Smoking status: Never Smoker  . Smokeless tobacco: Never Used  Substance Use Topics  . Alcohol use: Yes    Alcohol/week: 0.0 oz    Comment: 1-2 glasses of wine a month / occasional   Objective:   BP 120/70 (BP Location: Left Arm, Patient Position: Sitting, Cuff Size: Large)    Pulse 74   Temp 98.5 F (36.9 C) (Oral)   Resp 20   Wt 185 lb (83.9 kg)   LMP 11/27/1980   SpO2 94%   BMI 33.84 kg/m  Vitals:   01/03/18 1453  BP: 120/70  Pulse: 74  Resp: 20  Temp: 98.5 F (36.9 C)  TempSrc: Oral  SpO2: 94%  Weight: 185 lb (83.9 kg)     Physical Exam  Constitutional: She appears well-developed and well-nourished. No distress.  HENT:  Head: Normocephalic and atraumatic.  Right Ear: Hearing, tympanic membrane, external ear and ear canal normal.  Left Ear: Hearing, tympanic membrane, external ear and ear canal normal.  Nose: Right sinus exhibits maxillary sinus tenderness. Left sinus exhibits maxillary sinus tenderness.  Mouth/Throat: Uvula is midline, oropharynx is clear and moist and mucous membranes are normal. No oropharyngeal exudate.  Eyes: Conjunctivae are normal. Pupils are equal, round, and reactive to light. Right eye exhibits no discharge. Left eye exhibits no discharge. No scleral icterus.  Neck: Normal range of motion. Neck supple. No tracheal deviation present. No thyromegaly present.  Cardiovascular: Normal rate, regular rhythm and normal heart sounds. Exam reveals no gallop and no friction rub.  No murmur heard. Pulmonary/Chest: Effort normal and breath sounds normal. No stridor. No respiratory distress. She has no wheezes. She has no rales.  Lymphadenopathy:    She has no cervical adenopathy.  Skin: Skin is warm and dry. She is not diaphoretic.  Vitals reviewed.       Assessment & Plan:     1. Acute maxillary sinusitis, recurrence not specified Worsening symptoms that have not responded to OTC medications. Will give augmentin as below. Continue allergy medications. Stay well hydrated and get plenty of rest. Call if no symptom improvement or if symptoms worsen. - amoxicillin-clavulanate (AUGMENTIN) 875-125 MG tablet; Take 1 tablet by mouth 2 (two) times daily.  Dispense: 20 tablet; Refill: 0  2. Cough Tessalon perles for cough. -  benzonatate (TESSALON) 200 MG capsule; Take 1 capsule (200 mg total) by mouth 3 (three) times daily as needed for cough.  Dispense: 30 capsule; Refill: 0  3. Decreased libido Discussed estrogen again and risks factors associated with HRT in older patients with increased breast cancer risk, increased cardiovascular risk, stroke risk and clotting. At this time she agrees to wait and will work on lifestyle modifications.       Margaretann Loveless, PA-C  [facility]

## 2018-01-03 NOTE — Patient Instructions (Signed)

## 2018-03-24 ENCOUNTER — Other Ambulatory Visit: Payer: Self-pay | Admitting: Family Medicine

## 2018-03-28 ENCOUNTER — Other Ambulatory Visit: Payer: Self-pay | Admitting: Family Medicine

## 2018-04-26 ENCOUNTER — Other Ambulatory Visit: Payer: Self-pay | Admitting: Family Medicine

## 2018-05-28 ENCOUNTER — Telehealth: Payer: Self-pay

## 2018-05-28 MED ORDER — ROSUVASTATIN CALCIUM 5 MG PO TABS: 5.0000 mg | ORAL_TABLET | Freq: Every day | ORAL | 1 refills | Status: DC

## 2018-05-28 NOTE — Telephone Encounter (Signed)
Have sent prescription for rosuvastatin to [facility] [address]. She needs to schedule follow up in 1 month.

## 2018-05-28 NOTE — Telephone Encounter (Signed)
Patient is requesting to have Lovastatin changed or discontinued due to joint pain and muscle aches. CB# [phone]

## 2018-05-28 NOTE — Telephone Encounter (Signed)
Please advise 

## 2018-05-29 NOTE — Telephone Encounter (Signed)
LMOVM for pt to return call 

## 2018-05-31 NOTE — Telephone Encounter (Signed)
Patient advised and follow up appointment scheduled 07/01/2018 at 9:40am.

## 2018-06-16 ENCOUNTER — Other Ambulatory Visit: Payer: Self-pay | Admitting: Family Medicine

## 2018-06-18 ENCOUNTER — Other Ambulatory Visit: Payer: Self-pay | Admitting: Family Medicine

## 2018-06-22 ENCOUNTER — Other Ambulatory Visit: Payer: Self-pay | Admitting: Family Medicine

## 2018-06-28 NOTE — Progress Notes (Deleted)
Patient: Miguelina Fore Female    DOB: 14-Jul-1951   67 y.o.   MRN: [id number] Visit Date: 06/28/2018  Today's Provider: [name], MD   No chief complaint on file.  Subjective:    HPI   Lipid/Cholesterol, Follow-up:   Last seen for this 1 months ago.  Management since that visit includes; patient called office to have lovastatin changed or discontinued due to joint pain and muscles aches. Changed to rosuvastatin 5 mg qd and advised follow up ov in 1 month.   Last Lipid Panel:    Component Value Date/Time   CHOL 201 (H) 04/13/2017 1106   TRIG 161 (H) 04/13/2017 1106   HDL 65 04/13/2017 1106   CHOLHDL 3.1 04/13/2017 1106   LDLCALC 104 (H) 04/13/2017 1106    She reports {excellent/good/fair/poor:19665} compliance with treatment. She {ACTION; IS/IS [id number]} having side effects. ***  Wt Readings from Last 3 Encounters:  01/03/18 185 lb (83.9 kg)  11/30/17 185 lb (83.9 kg)  09/28/17 183 lb 12.8 oz (83.4 kg)    ------------------------------------------------------------------------    Allergies  Allergen Reactions  . Bactrim  [Sulfamethoxazole-Trimethoprim]   . Daypro  [Oxaprozin]   . Sulfa Antibiotics   . Sulfasalazine      Current Outpatient Medications:  .  acetaminophen (TYLENOL) 325 MG tablet, Take 650 mg by mouth every 4 (four) hours as needed., Disp: , Rfl:  .  alendronate (FOSAMAX) 70 MG tablet, TAKE 1 TABLET BY MOUTH ONCE A WEEK. TAKE WITH FULL GLASS OF WATER ON EMPTY STOMACH, Disp: 12 tablet, Rfl: 3 .  ALPRAZolam (XANAX) 0.25 MG tablet, TAKE 1 TO 2 TABLETS BY MOUTH EVERY 4 HOURS AS NEEDED FOR ANXIETY (NOT TO EXCEED 4 PER DAY), Disp: 30 tablet, Rfl: 5 .  Calcium Carb-Cholecalciferol (CALCIUM PLUS VITAMIN D3) 600-500 MG-UNIT CAPS, Take 600 Units by mouth daily., Disp: , Rfl:  .  cholecalciferol (VITAMIN D) 1000 units tablet, Take 1 tablet by mouth daily., Disp: , Rfl:  .  clorazepate (TRANXENE) 15 MG tablet, TAKE 1/2 TABLET BY MOUTH 3 TIMES  A DAY AS NEEDED, Disp: 30 tablet, Rfl: 1 .  diclofenac sodium (VOLTAREN) 1 % GEL, Apply 2 g topically 4 (four) times daily., Disp: , Rfl:  .  FLUoxetine (PROZAC) 20 MG capsule, TAKE 1 CAPSULE (20 MG TOTAL) BY MOUTH DAILY., Disp: 90 capsule, Rfl: 4 .  lisinopril-hydrochlorothiazide (PRINZIDE,ZESTORETIC) 10-12.5 MG tablet, TAKE 1 TABLET BY MOUTH EVERY DAY, Disp: 30 tablet, Rfl: 12 .  Misc Natural Products (JOINT SUPPORT PO), Take by mouth., Disp: , Rfl:  .  montelukast (SINGULAIR) 10 MG tablet, TAKE 1 TABLET (10 MG TOTAL) BY MOUTH DAILY. FOR ALLERGIES, Disp: 30 tablet, Rfl: 6 .  Multiple Vitamins-Minerals (MULTIVITAMIN ADULT PO), Take 1 tablet by mouth daily., Disp: , Rfl:  .  omega-3 acid ethyl esters (LOVAZA) 1 g capsule, Take 2 g by mouth 2 (two) times daily., Disp: , Rfl:  .  Omega-3 Fatty Acids (FISH OIL) 1000 MG CAPS, Take 1 capsule by mouth daily. , Disp: , Rfl:  .  rosuvastatin (CRESTOR) 5 MG tablet, TAKE 1 TABLET BY MOUTH EVERY DAY, Disp: 30 tablet, Rfl: 5 .  triamcinolone cream (KENALOG) 0.5 %, Apply 1 application topically daily as needed., Disp: , Rfl:  .  TURMERIC PO, Take by mouth., Disp: , Rfl:   Review of Systems  Constitutional: Negative for appetite change, chills, fatigue and fever.  Respiratory: Negative for chest tightness and shortness of breath.  Cardiovascular: Negative for chest pain and palpitations.  Gastrointestinal: Negative for abdominal pain, nausea and vomiting.  Neurological: Negative for dizziness and weakness.    Social History   Tobacco Use  . Smoking status: Never Smoker  . Smokeless tobacco: Never Used  Substance Use Topics  . Alcohol use: Yes    Alcohol/week: 0.0 oz    Comment: 1-2 glasses of wine a month / occasional   Objective:   LMP 11/27/1980  There were no vitals filed for this visit.   Physical Exam      Assessment & Plan:           Mila Merryonald Fisher, MD  [facility]

## 2018-07-01 ENCOUNTER — Ambulatory Visit: Payer: Self-pay | Admitting: Family Medicine

## 2018-07-03 ENCOUNTER — Ambulatory Visit (INDEPENDENT_AMBULATORY_CARE_PROVIDER_SITE_OTHER): Payer: Medicare Other | Admitting: Family Medicine

## 2018-07-03 ENCOUNTER — Encounter: Payer: Self-pay | Admitting: Family Medicine

## 2018-07-03 VITALS — BP 135/85 | HR 78 | Temp 97.6°F | Resp 18 | Ht 62.0 in | Wt 183.0 lb

## 2018-07-03 DIAGNOSIS — F411 Generalized anxiety disorder: Secondary | ICD-10-CM

## 2018-07-03 DIAGNOSIS — R5383 Other fatigue: Secondary | ICD-10-CM | POA: Diagnosis not present

## 2018-07-03 DIAGNOSIS — M791 Myalgia, unspecified site: Secondary | ICD-10-CM

## 2018-07-03 DIAGNOSIS — R7303 Prediabetes: Secondary | ICD-10-CM

## 2018-07-03 NOTE — Patient Instructions (Signed)
Stop taking rosuvastatin for the time being

## 2018-07-03 NOTE — Progress Notes (Signed)
Patient: Gabrielle Salinas Female    DOB: [birth year]   67 y.o.   MRN: [id number] Visit Date: 07/03/2018  Today's Provider: Lelon Huh, MD   Chief Complaint  Patient presents with  . Hyperlipidemia  . Hypertension  . Anxiety  . Hyperglycemia   Subjective:    HPI   Hypertension, follow-up:  BP Readings from Last 3 Encounters:  07/03/18 135/85  01/03/18 120/70  11/30/17 110/60    She was last seen for hypertension 04/13/2017.  BP at that visit was 112/78. Management since that visit includes; no changes.She reports good compliance with treatment. She is not having side effects.  She is exercising. She is adherent to low salt diet.   Outside blood pressures are checked occasionally.   ------------------------------------------------------------------------    Lipid/Cholesterol, Follow-up:   Last seen for this 1 months ago.  Management since that visit includes; on 05/28/2018 patient called office requesting to have lovastatin changed or discontinued due to joint pain and muscle aches. Changed to Rosuvastatin 5 mg qd and advised pt to follow up for ov in 1 month.  Last Lipid Panel:    Component Value Date/Time   CHOL 201 (H) 04/13/2017 1106   TRIG 161 (H) 04/13/2017 1106   HDL 65 04/13/2017 1106   CHOLHDL 3.1 04/13/2017 1106   LDLCALC 104 (H) 04/13/2017 1106    She reports good compliance with treatment. She is having side effects. Having muscle aches and joint pain.  Wt Readings from Last 3 Encounters:  07/03/18 183 lb (83 kg)  01/03/18 185 lb (83.9 kg)  11/30/17 185 lb (83.9 kg)    ------------------------------------------------------------------------  Anxiety state From 04/13/2017-no changes. Doing well with occasional alprazolam. Today patient comes in reporting that she has been stressed lately due to family issues. Her son lost his job and she has had to take on the responsibility of watching her grandchildren.   Prediabetes From  04/13/2017-labs checked, no changes. Hemoglobin A1c 5.8. Patient reports that she has been trying to eat a well balanced meal. She also walks her dog every morning for exercise.   She states she has been having persistent aching in her shoulders, back and legs for several months. She stopped taking lovastatin for about a week and symptoms improved, but did not resolve. We then had her change to rosuvastatin and her muscle aches started to flare up again. She has also been feeling very fatigued, which she mainly attributes to stress.   Allergies  Allergen Reactions  . Bactrim  [Sulfamethoxazole-Trimethoprim]   . Daypro  [Oxaprozin]   . Sulfa Antibiotics   . Sulfasalazine      Current Outpatient Medications:  .  acetaminophen (TYLENOL) 325 MG tablet, Take 650 mg by mouth every 4 (four) hours as needed., Disp: , Rfl:  .  alendronate (FOSAMAX) 70 MG tablet, TAKE 1 TABLET BY MOUTH ONCE A WEEK. TAKE WITH FULL GLASS OF WATER ON EMPTY STOMACH, Disp: 12 tablet, Rfl: 3 .  ALPRAZolam (XANAX) 0.25 MG tablet, TAKE 1 TO 2 TABLETS BY MOUTH EVERY 4 HOURS AS NEEDED FOR ANXIETY (NOT TO EXCEED 4 PER DAY), Disp: 30 tablet, Rfl: 5 .  Calcium Carb-Cholecalciferol (CALCIUM PLUS VITAMIN D3) 600-500 MG-UNIT CAPS, Take 600 Units by mouth daily., Disp: , Rfl:  .  cholecalciferol (VITAMIN D) 1000 units tablet, Take 1 tablet by mouth daily., Disp: , Rfl:  .  clorazepate (TRANXENE) 15 MG tablet, TAKE 1/2 TABLET BY MOUTH 3 TIMES A DAY AS NEEDED,  Disp: 30 tablet, Rfl: 1 .  diclofenac sodium (VOLTAREN) 1 % GEL, Apply 2 g topically 4 (four) times daily., Disp: , Rfl:  .  FLUoxetine (PROZAC) 20 MG capsule, TAKE 1 CAPSULE (20 MG TOTAL) BY MOUTH DAILY., Disp: 90 capsule, Rfl: 4 .  lisinopril-hydrochlorothiazide (PRINZIDE,ZESTORETIC) 10-12.5 MG tablet, TAKE 1 TABLET BY MOUTH EVERY DAY, Disp: 30 tablet, Rfl: 12 .  Misc Natural Products (JOINT SUPPORT PO), Take by mouth., Disp: , Rfl:  .  montelukast (SINGULAIR) 10 MG tablet, TAKE 1  TABLET (10 MG TOTAL) BY MOUTH DAILY. FOR ALLERGIES, Disp: 30 tablet, Rfl: 6 .  Multiple Vitamins-Minerals (MULTIVITAMIN ADULT PO), Take 1 tablet by mouth daily., Disp: , Rfl:  .  Omega-3 Fatty Acids (FISH OIL) 1000 MG CAPS, Take 1 capsule by mouth daily. , Disp: , Rfl:  .  rosuvastatin (CRESTOR) 5 MG tablet, TAKE 1 TABLET BY MOUTH EVERY DAY, Disp: 30 tablet, Rfl: 5 .  triamcinolone cream (KENALOG) 0.5 %, Apply 1 application topically daily as needed., Disp: , Rfl:  .  TURMERIC PO, Take by mouth., Disp: , Rfl:  .  omega-3 acid ethyl esters (LOVAZA) 1 g capsule, Take 2 g by mouth 2 (two) times daily., Disp: , Rfl:   Review of Systems  Constitutional: Positive for fatigue. Negative for appetite change, chills and fever.  Respiratory: Negative for chest tightness and shortness of breath.   Cardiovascular: Negative for chest pain and palpitations.  Gastrointestinal: Negative for abdominal pain, nausea and vomiting.  Musculoskeletal: Positive for arthralgias and myalgias.  Neurological: Negative for dizziness and weakness.  Psychiatric/Behavioral: The patient is nervous/anxious.     Social History   Tobacco Use  . Smoking status: Never Smoker  . Smokeless tobacco: Never Used  Substance Use Topics  . Alcohol use: Yes    Alcohol/week: 0.0 oz    Comment: 1-2 glasses of wine a month / occasional   Objective:   BP 135/85 (BP Location: Left Arm, Patient Position: Sitting, Cuff Size: Large)   Pulse 78   Temp 97.6 F (36.4 C) (Oral)   Resp 18   Ht '5\' 2"'$  (1.575 m)   Wt 183 lb (83 kg)   LMP 11/27/1980   SpO2 97% Comment: room air  BMI 33.47 kg/m  Vitals:   07/03/18 1047  BP: 135/85  Pulse: 78  Resp: 18  Temp: 97.6 F (36.4 C)  TempSrc: Oral  SpO2: 97%  Weight: 183 lb (83 kg)  Height: '5\' 2"'$  (1.575 m)     Physical Exam   General Appearance:    Alert, cooperative, no distress  Eyes:    PERRL, conjunctiva/corneas clear, EOM's intact       Lungs:     Clear to auscultation  bilaterally, respirations unlabored  Heart:    Regular rate and rhythm  Neurologic:   Awake, alert, oriented x 3. No apparent focal neurological           defect.            Assessment & Plan:     1. Myalgia She had been on lovastatin for several yearly. Will put on hold for the next month and see if muscle pains improved. Check sed rate to rule out PMR - Sed Rate (ESR)  2. Other fatigue  - Comprehensive metabolic panel - CBC - TSH  3. Prediabetes  - Hemoglobin A1c  4. Anxiety Stable on current dose of alprazolam.       Lelon Huh, MD  [facility]  [facility]  

## 2018-07-04 ENCOUNTER — Other Ambulatory Visit: Payer: Self-pay | Admitting: Family Medicine

## 2018-07-04 LAB — COMPREHENSIVE METABOLIC PANEL
A/G RATIO: 1.8 (ref 1.2–2.2)
ALT: 29 IU/L (ref 0–32)
AST: 29 IU/L (ref 0–40)
Albumin: 4.3 g/dL (ref 3.6–4.8)
Alkaline Phosphatase: 99 IU/L (ref 39–117)
BUN/Creatinine Ratio: 20 (ref 12–28)
BUN: 13 mg/dL (ref 8–27)
Bilirubin Total: 0.3 mg/dL (ref 0.0–1.2)
CALCIUM: 9.5 mg/dL (ref 8.7–10.3)
CHLORIDE: 100 mmol/L (ref 96–106)
CO2: 26 mmol/L (ref 20–29)
Creatinine, Ser: 0.66 mg/dL (ref 0.57–1.00)
GFR calc Af Amer: 105 mL/min/{1.73_m2} (ref >59)
GFR, EST NON AFRICAN AMERICAN: 91 mL/min/{1.73_m2} (ref >59)
Globulin, Total: 2.4 g/dL (ref 1.5–4.5)
Glucose: 113 mg/dL — ABNORMAL HIGH (ref 65–99)
POTASSIUM: 3.9 mmol/L (ref 3.5–5.2)
Sodium: 142 mmol/L (ref 134–144)
Total Protein: 6.7 g/dL (ref 6.0–8.5)

## 2018-07-04 LAB — CBC
HEMOGLOBIN: 13.5 g/dL (ref 11.1–15.9)
Hematocrit: 43.3 % (ref 34.0–46.6)
MCH: 29.2 pg (ref 26.6–33.0)
MCHC: 31.2 g/dL — ABNORMAL LOW (ref 31.5–35.7)
MCV: 94 fL (ref 79–97)
Platelets: 228 10*3/uL (ref 150–450)
RBC: 4.62 x10E6/uL (ref 3.77–5.28)
RDW: 15 % (ref 12.3–15.4)
WBC: 6.4 10*3/uL (ref 3.4–10.8)

## 2018-07-04 LAB — SEDIMENTATION RATE: Sed Rate: 16 mm/hr (ref 0–40)

## 2018-07-04 LAB — TSH: TSH: 0.976 u[IU]/mL (ref 0.450–4.500)

## 2018-07-04 LAB — HEMOGLOBIN A1C
ESTIMATED AVERAGE GLUCOSE: 134 mg/dL
HEMOGLOBIN A1C: 6.3 % — AB (ref 4.8–5.6)

## 2018-07-17 ENCOUNTER — Other Ambulatory Visit: Payer: Self-pay | Admitting: Physician Assistant

## 2018-07-18 ENCOUNTER — Telehealth: Payer: Self-pay | Admitting: Family Medicine

## 2018-07-18 MED ORDER — ESOMEPRAZOLE MAGNESIUM 40 MG PO CPDR: 40.0000 mg | DELAYED_RELEASE_CAPSULE | Freq: Every day | ORAL | 5 refills | Status: DC

## 2018-07-18 MED ORDER — ALPRAZOLAM 0.25 MG PO TABS: ORAL_TABLET | ORAL | 5 refills | Status: DC

## 2018-07-18 NOTE — Telephone Encounter (Signed)
Please advise 

## 2018-07-18 NOTE — Telephone Encounter (Addendum)
Pt requesting a refill on the following medication and would like to know if she can get a prescription for Nexium. Pt use's [facility].Thanks CC  ALPRAZolam (XANAX) 0.25 MG tablet

## 2018-07-24 ENCOUNTER — Other Ambulatory Visit: Payer: Self-pay | Admitting: Family Medicine

## 2018-07-31 ENCOUNTER — Other Ambulatory Visit: Payer: Self-pay | Admitting: Family Medicine

## 2018-08-01 DIAGNOSIS — H04123 Dry eye syndrome of bilateral lacrimal glands: Secondary | ICD-10-CM | POA: Diagnosis not present

## 2018-08-06 ENCOUNTER — Other Ambulatory Visit: Payer: Self-pay | Admitting: Family Medicine

## 2018-09-21 ENCOUNTER — Other Ambulatory Visit: Payer: Self-pay | Admitting: Family Medicine

## 2018-09-21 DIAGNOSIS — F411 Generalized anxiety disorder: Secondary | ICD-10-CM

## 2018-09-23 ENCOUNTER — Other Ambulatory Visit: Payer: Self-pay | Admitting: Family Medicine

## 2018-09-30 ENCOUNTER — Ambulatory Visit (INDEPENDENT_AMBULATORY_CARE_PROVIDER_SITE_OTHER): Payer: Medicare Other

## 2018-09-30 VITALS — BP 122/82 | HR 76 | Temp 98.2°F | Resp 16 | Ht 62.0 in | Wt 177.4 lb

## 2018-09-30 DIAGNOSIS — Z0000 Encounter for general adult medical examination without abnormal findings: Secondary | ICD-10-CM | POA: Diagnosis not present

## 2018-09-30 DIAGNOSIS — Z1239 Encounter for other screening for malignant neoplasm of breast: Secondary | ICD-10-CM

## 2018-09-30 DIAGNOSIS — Z780 Asymptomatic menopausal state: Secondary | ICD-10-CM | POA: Diagnosis not present

## 2018-09-30 DIAGNOSIS — Z23 Encounter for immunization: Secondary | ICD-10-CM

## 2018-09-30 NOTE — Progress Notes (Signed)
Subjective:   Gabrielle Salinas is a 67 y.o. female who presents for Medicare Annual (Subsequent) preventive examination.  Review of Systems:   Cardiac Risk Factors include: advanced age (>79men, >79 women);dyslipidemia;obesity (BMI >30kg/m2)     Objective:     Vitals: BP 122/82 (BP Location: Right Arm, Patient Position: Sitting, Cuff Size: Normal)   Pulse 76   Temp 98.2 F (36.8 C) (Oral)   Resp 16   Ht 5\' 2"  (1.575 m)   Wt 177 lb 6.4 oz (80.5 kg)   LMP 11/27/1980   SpO2 97%   BMI 32.45 kg/m   Body mass index is 32.45 kg/m.  Advanced Directives 09/30/2018 09/28/2017  Does Patient Have a Medical Advance Directive? No No  Would patient like information on creating a medical advance directive? Yes (MAU/Ambulatory/Procedural Areas - Information given) (No Data)    Tobacco Social History   Tobacco Use  Smoking Status Never Smoker  Smokeless Tobacco Never Used     Counseling given: Not Answered   Clinical Intake:  Pre-visit preparation completed: Yes  Pain : No/denies pain     Nutritional Status: BMI > 30  Obese Nutritional Risks: None Diabetes: No  How often do you need to have someone help you when you read instructions, pamphlets, or other written materials from your doctor or pharmacy?: 1 - Never What is the last grade level you completed in school?: associates degree  Interpreter Needed?: No  Information entered by :: Reather Littler LPN  Past Medical History:  Diagnosis Date  . Allergy   . Anxiety   . Depression   . GERD (gastroesophageal reflux disease)   . Hyperlipidemia   . Hypertension   . Osteoporosis    Past Surgical History:  Procedure Laterality Date  . BREAST BIOPSY Right <10 yrs ago   x2 benign results  . COLONOSCOPY  11/19/2017  . GI Bleed  2008  . RHINOPLASTY    . UPPER GASTROINTESTINAL ENDOSCOPY  10/2017   [facility]  . VAGINAL HYSTERECTOMY  1982   due to abnormal pap smears and menorrhagia   Family History  Problem Relation Age of  Onset  . Lupus Mother   . Heart attack Father   . Diabetes Father   . Diabetes Sister   . Diabetes Sister   . Diabetes Sister   . Breast cancer Neg Hx    Social History   Socioeconomic History  . Marital status: Divorced    Spouse name: Not on file  . Number of children: 3  . Years of education: Not on file  . Highest education level: Associate degree: academic program  Occupational History  . Occupation: retired    Comment: keeps granddaughter  Social Needs  . Financial resource strain: Somewhat hard  . Food insecurity:    Worry: Never true    Inability: Never true  . Transportation needs:    Medical: No    Non-medical: No  Tobacco Use  . Smoking status: Never Smoker  . Smokeless tobacco: Never Used  Substance and Sexual Activity  . Alcohol use: Yes    Alcohol/week: 0.0 standard drinks    Comment: 1-2 glasses of wine a month / occasional  . Drug use: Never  . Sexual activity: Not on file  Lifestyle  . Physical activity:    Days per week: 7 days    Minutes per session: 30 min  . Stress: Not on file  Relationships  . Social connections:    Talks on phone: More  than three times a week    Gets together: More than three times a week    Attends religious service: More than 4 times per year    Active member of club or organization: Yes    Attends meetings of clubs or organizations: Never    Relationship status: Divorced  Other Topics Concern  . Not on file  Social History Narrative  . Not on file    Outpatient Encounter Medications as of 09/30/2018  Medication Sig  . acetaminophen (TYLENOL) 325 MG tablet Take 650 mg by mouth every 4 (four) hours as needed.  Marland Kitchen alendronate (FOSAMAX) 70 MG tablet TAKE 1 TABLET BY MOUTH ONE TIME PER WEEK WITH FULL GLASS OF WATER ON EMPTY STOMACH  . ALPRAZolam (XANAX) 0.25 MG tablet TAKE 1 TO 2 TABLETS BY MOUTH EVERY 4 HOURS AS NEEDED FOR ANXIETY (NOT TO EXCEED 4 PER DAY)  . Calcium Carb-Cholecalciferol (CALCIUM PLUS VITAMIN D3)  600-500 MG-UNIT CAPS Take 600 Units by mouth daily.  . clorazepate (TRANXENE) 15 MG tablet TAKE 1/2 TABLET BY MOUTH 3 TIMES A DAY AS NEEDED  . diclofenac sodium (VOLTAREN) 1 % GEL Apply 2 g topically 4 (four) times daily.  Marland Kitchen esomeprazole (NEXIUM) 40 MG capsule Take 1 capsule (40 mg total) by mouth daily at 12 noon.  Marland Kitchen FLUoxetine (PROZAC) 20 MG capsule TAKE 1 CAPSULE BY MOUTH EVERY DAY  . lisinopril-hydrochlorothiazide (PRINZIDE,ZESTORETIC) 10-12.5 MG tablet TAKE 1 TABLET BY MOUTH EVERY DAY  . montelukast (SINGULAIR) 10 MG tablet TAKE 1 TABLET (10 MG TOTAL) BY MOUTH DAILY. FOR ALLERGIES  . Multiple Vitamins-Minerals (MULTIVITAMIN ADULT PO) Take 1 tablet by mouth daily.  . Omega-3 Fatty Acids (FISH OIL) 1000 MG CAPS Take 1 capsule by mouth daily.   . cholecalciferol (VITAMIN D) 1000 units tablet Take 1 tablet by mouth daily.  . Misc Natural Products (JOINT SUPPORT PO) Take by mouth.  . omega-3 acid ethyl esters (LOVAZA) 1 g capsule Take 2 g by mouth 2 (two) times daily.  Marland Kitchen triamcinolone cream (KENALOG) 0.5 % Apply 1 application topically daily as needed.  . TURMERIC PO Take by mouth.   No facility-administered encounter medications on file as of 09/30/2018.     Activities of Daily Living In your present state of health, do you have any difficulty performing the following activities: 09/30/2018  Hearing? N  Comment declines hearing aids  Vision? N  Comment wears contacts and reading glasses  Difficulty concentrating or making decisions? N  Walking or climbing stairs? N  Dressing or bathing? N  Doing errands, shopping? N  Preparing Food and eating ? N  Using the Toilet? N  In the past six months, have you accidently leaked urine? N  Do you have problems with loss of bowel control? N  Managing your Medications? N  Managing your Finances? N  Housekeeping or managing your Housekeeping? N  Some recent data might be hidden    Patient Care Team: Malva Limes, MD as PCP - General  (Family Medicine) Patty, A. Azucena Kuba, MD as Consulting Physician (Ophthalmology) Juanell Fairly, MD as Referring Physician (Orthopedic Surgery)    Assessment:   This is a routine wellness examination for [place].  Exercise Activities and Dietary recommendations Current Exercise Habits: Home exercise routine, Type of exercise: walking, Time (Minutes): 30, Frequency (Times/Week): 7, Weekly Exercise (Minutes/Week): 210, Intensity: Mild  Goals    . DIET - INCREASE WATER INTAKE     Recommend 6-8 glasses of water per day    .  Have 3 meals a day     Recommend eating 3 meals a day with 2 healthy snacks in between.        Fall Risk Fall Risk  09/30/2018 09/28/2017 04/13/2017 03/14/2016  Falls in the past year? 0 Yes Yes Yes  Number falls in past yr: - 2 or more 1 1  Comment - knee gave out - -  Injury with Fall? - No Yes No  Follow up - Falls prevention discussed - -   FALL RISK PREVENTION PERTAINING TO THE HOME:  Any stairs in or around the home WITH handrails? No  Home free of loose throw rugs in walkways, pet beds, electrical cords, etc? Yes  Adequate lighting in your home to reduce risk of falls? Yes   ASSISTIVE DEVICES UTILIZED TO PREVENT FALLS:  Life alert? No  Use of a cane, walker or w/c? No  Grab bars in the bathroom? No  Shower chair or bench in shower? No  Elevated toilet seat or a handicapped toilet? No   DME ORDERS:  DME order needed?  No   TIMED UP AND GO:  Was the test performed? Yes .  Length of time to ambulate 10 feet: 7 sec.   GAIT:  Appearance of gait: Gait stead-fast and without the use of an assistive device. Education: Fall risk prevention has been discussed.  Intervention(s) required? No   Depression Screen PHQ 2/9 Scores 09/30/2018 09/28/2017 04/13/2017 03/14/2016  PHQ - 2 Score 0 0 0 1  PHQ- 9 Score - - 3 7     Cognitive Function - pt declined 6 CIT     6CIT Screen 09/28/2017  What Year? 0 points  What month? 0 points  What time? 0 points    Count back from 20 0 points  Months in reverse 0 points  Repeat phrase 2 points  Total Score 2    Immunization History  Administered Date(s) Administered  . Influenza, High Dose Seasonal PF 09/02/2016, 09/28/2017, 09/30/2018  . Pneumococcal Conjugate-13 07/30/2014  . Pneumococcal Polysaccharide-23 10/22/1998, 03/14/2016  . Td 07/23/1995  . Tdap 09/29/2011  . Zoster 07/30/2014    Qualifies for Shingles Vaccine? Yes  Zostavax completed 07/30/14. Due for Shingrix. Education has been provided regarding the importance of this vaccine. Pt has been advised to call insurance company to determine out of pocket expense. Advised may also receive vaccine at local pharmacy or Health Dept. Verbalized acceptance and understanding.  Tdap: Up to date  Flu Vaccine: Due for Flu vaccine. Does the patient want to receive this vaccine today?  Yes    Pneumococcal Vaccine: Up to date   Screening Tests Health Maintenance  Topic Date Due  . DEXA SCAN  04/20/2018  . INFLUENZA VACCINE  06/27/2018  . MAMMOGRAM  07/03/2019  . TETANUS/TDAP  09/28/2021  . COLONOSCOPY  11/20/2027  . Hepatitis C Screening  Completed  . PNA vac Low Risk Adult  Completed    Cancer Screenings:  Colorectal Screening: Completed 11/19/17. Repeat *see result note for recommendations* Advised to discuss with Dr. Sherrie Mustache at next appt.   Mammogram: Completed 07/02/17. Order placed for next year  Bone Density: Completed 04/20/16. Results reflect  OSTEOPOROTIC. Repeat every 2 years. Ordered today. Pt provided with contact info and advised to call to schedule appt.   Lung Cancer Screening: (Low Dose CT Chest recommended if Age 70-80 years, 30 pack-year currently smoking OR have quit w/in 15years.) does not qualify.    Additional Screening:  Hepatitis C Screening: does  qualify; Completed 07/30/14  Vision Screening: Recommended annual ophthalmology exams for early detection of glaucoma and other disorders of the eye. Is the patient up  to date with their annual eye exam?  Yes  Who is the provider or what is the name of the office in which the pt attends annual eye exams? Dr. Alexia Freestone  Dental Screening: Recommended annual dental exams for proper oral hygiene  Community Resource Referral:  CRR required this visit?  No      Plan:    I have personally reviewed and addressed the Medicare Annual Wellness questionnaire and have noted the following in the patient's chart:  A. Medical and social history B. Use of alcohol, tobacco or illicit drugs  C. Current medications and supplements D. Functional ability and status E.  Nutritional status F.  Physical activity G. Advance directives H. List of other physicians I.  Hospitalizations, surgeries, and ER visits in previous 12 months J.  Vitals K. Screenings such as hearing and vision if needed, cognitive and depression L. Referrals and appointments   In addition, I have reviewed and discussed with patient certain preventive protocols, quality metrics, and best practice recommendations. A written personalized care plan for preventive services as well as general preventive health recommendations were provided to patient.   Signed,  Reather Littler, LPN Nurse Health Advisor   Nurse Notes: pt scheduled physical exam with Dr. Sherrie Mustache 10/15/18, she is concerned about colonoscopy from 2018 being inconclusive, please review.

## 2018-09-30 NOTE — Patient Instructions (Addendum)
Gabrielle Salinas , Thank you for taking time to come for your Medicare Wellness Visit. I appreciate your ongoing commitment to your health goals. Please review the following plan we discussed and let me know if I can assist you in the future.   Screening recommendations/referrals: Colonoscopy: discuss with Dr. Sherrie Mustache at next visit Mammogram: due August 2020 Bone Density: Please call [phone] to schedule your bone density exam.  Recommended yearly ophthalmology/optometry visit for glaucoma screening and checkup Recommended yearly dental visit for hygiene and checkup  Vaccinations: Influenza vaccine: done today Pneumococcal vaccine: Up to date Tdap vaccine: Up to date Shingles vaccine: Shingrix discussed    Advanced directives: Advance directive discussed with you today. I have provided a copy for you to complete at home and have notarized. Once this is complete please bring a copy in to our office so we can scan it into your chart.  Conditions/risks identified: recommend drinking 6-8 glasses of water per day   Next appointment: Please follow up in one year for your Medicare Annual Wellness visit.     Preventive Care 39 Years and Older, Female Preventive care refers to lifestyle choices and visits with your health care provider that can promote health and wellness. What does preventive care include?  A yearly physical exam. This is also called an annual well check.  Dental exams once or twice a year.  Routine eye exams. Ask your health care provider how often you should have your eyes checked.  Personal lifestyle choices, including:  Daily care of your teeth and gums.  Regular physical activity.  Eating a healthy diet.  Avoiding tobacco and drug use.  Limiting alcohol use.  Practicing safe sex.  Taking low-dose aspirin every day.  Taking vitamin and mineral supplements as recommended by your health care provider. What happens during an annual well check? The services  and screenings done by your health care provider during your annual well check will depend on your age, overall health, lifestyle risk factors, and family history of disease. Counseling  Your health care provider may ask you questions about your:  Alcohol use.  Tobacco use.  Drug use.  Emotional well-being.  Home and relationship well-being.  Sexual activity.  Eating habits.  History of falls.  Memory and ability to understand (cognition).  Work and work Astronomer.  Reproductive health. Screening  You may have the following tests or measurements:  Height, weight, and BMI.  Blood pressure.  Lipid and cholesterol levels. These may be checked every 5 years, or more frequently if you are over 37 years old.  Skin check.  Lung cancer screening. You may have this screening every year starting at age 61 if you have a 30-pack-year history of smoking and currently smoke or have quit within the past 15 years.  Fecal occult blood test (FOBT) of the stool. You may have this test every year starting at age 39.  Flexible sigmoidoscopy or colonoscopy. You may have a sigmoidoscopy every 5 years or a colonoscopy every 10 years starting at age 64.  Hepatitis C blood test.  Hepatitis B blood test.  Sexually transmitted disease (STD) testing.  Diabetes screening. This is done by checking your blood sugar (glucose) after you have not eaten for a while (fasting). You may have this done every 1-3 years.  Bone density scan. This is done to screen for osteoporosis. You may have this done starting at age 63.  Mammogram. This may be done every 1-2 years. Talk to your health care provider  about how often you should have regular mammograms. Talk with your health care provider about your test results, treatment options, and if necessary, the need for more tests. Vaccines  Your health care provider may recommend certain vaccines, such as:  Influenza vaccine. This is recommended every  year.  Tetanus, diphtheria, and acellular pertussis (Tdap, Td) vaccine. You may need a Td booster every 10 years.  Zoster vaccine. You may need this after age 8.  Pneumococcal 13-valent conjugate (PCV13) vaccine. One dose is recommended after age 63.  Pneumococcal polysaccharide (PPSV23) vaccine. One dose is recommended after age 31. Talk to your health care provider about which screenings and vaccines you need and how often you need them. This information is not intended to replace advice given to you by your health care provider. Make sure you discuss any questions you have with your health care provider. Document Released: 12/10/2015 Document Revised: 08/02/2016 Document Reviewed: 09/14/2015 Elsevier Interactive Patient Education  2017 [employer] Prevention in the Home Falls can cause injuries. They can happen to people of all ages. There are many things you can do to make your home safe and to help prevent falls. What can I do on the outside of my home?  Regularly fix the edges of walkways and driveways and fix any cracks.  Remove anything that might make you trip as you walk through a door, such as a raised step or threshold.  Trim any bushes or trees on the path to your home.  Use bright outdoor lighting.  Clear any walking paths of anything that might make someone trip, such as rocks or tools.  Regularly check to see if handrails are loose or broken. Make sure that both sides of any steps have handrails.  Any raised decks and porches should have guardrails on the edges.  Have any leaves, snow, or ice cleared regularly.  Use sand or salt on walking paths during winter.  Clean up any spills in your garage right away. This includes oil or grease spills. What can I do in the bathroom?  Use night lights.  Install grab bars by the toilet and in the tub and shower. Do not use towel bars as grab bars.  Use non-skid mats or decals in the tub or shower.  If you  need to sit down in the shower, use a plastic, non-slip stool.  Keep the floor dry. Clean up any water that spills on the floor as soon as it happens.  Remove soap buildup in the tub or shower regularly.  Attach bath mats securely with double-sided non-slip rug tape.  Do not have throw rugs and other things on the floor that can make you trip. What can I do in the bedroom?  Use night lights.  Make sure that you have a light by your bed that is easy to reach.  Do not use any sheets or blankets that are too big for your bed. They should not hang down onto the floor.  Have a firm chair that has side arms. You can use this for support while you get dressed.  Do not have throw rugs and other things on the floor that can make you trip. What can I do in the kitchen?  Clean up any spills right away.  Avoid walking on wet floors.  Keep items that you use a lot in easy-to-reach places.  If you need to reach something above you, use a strong step stool that has a grab bar.  Keep electrical cords out of the way.  Do not use floor polish or wax that makes floors slippery. If you must use wax, use non-skid floor wax.  Do not have throw rugs and other things on the floor that can make you trip. What can I do with my stairs?  Do not leave any items on the stairs.  Make sure that there are handrails on both sides of the stairs and use them. Fix handrails that are broken or loose. Make sure that handrails are as long as the stairways.  Check any carpeting to make sure that it is firmly attached to the stairs. Fix any carpet that is loose or worn.  Avoid having throw rugs at the top or bottom of the stairs. If you do have throw rugs, attach them to the floor with carpet tape.  Make sure that you have a light switch at the top of the stairs and the bottom of the stairs. If you do not have them, ask someone to add them for you. What else can I do to help prevent falls?  Wear shoes  that:  Do not have high heels.  Have rubber bottoms.  Are comfortable and fit you well.  Are closed at the toe. Do not wear sandals.  If you use a stepladder:  Make sure that it is fully opened. Do not climb a closed stepladder.  Make sure that both sides of the stepladder are locked into place.  Ask someone to hold it for you, if possible.  Clearly mark and make sure that you can see:  Any grab bars or handrails.  First and last steps.  Where the edge of each step is.  Use tools that help you move around (mobility aids) if they are needed. These include:  Canes.  Walkers.  Scooters.  Crutches.  Turn on the lights when you go into a dark area. Replace any light bulbs as soon as they burn out.  Set up your furniture so you have a clear path. Avoid moving your furniture around.  If any of your floors are uneven, fix them.  If there are any pets around you, be aware of where they are.  Review your medicines with your doctor. Some medicines can make you feel dizzy. This can increase your chance of falling. Ask your doctor what other things that you can do to help prevent falls. This information is not intended to replace advice given to you by your health care provider. Make sure you discuss any questions you have with your health care provider. Document Released: 09/09/2009 Document Revised: 04/20/2016 Document Reviewed: 12/18/2014 Elsevier Interactive Patient Education  2017 Elsevier Inc.Influenza (Flu) Vaccine (Inactivated or Recombinant): What You Need to Know 1. Why get vaccinated? Influenza ("flu") is a contagious disease that spreads around the [place] every year, usually between October and May. Flu is caused by influenza viruses, and is spread mainly by coughing, sneezing, and close contact. Anyone can get flu. Flu strikes suddenly and can last several days. Symptoms vary by age, but can include:  fever/chills  sore throat  muscle  aches  fatigue  cough  headache  runny or stuffy nose  Flu can also lead to pneumonia and blood infections, and cause diarrhea and seizures in children. If you have a medical condition, such as heart or lung disease, flu can make it worse. Flu is more dangerous for some people. Infants and young children, people 31 years of age and older, pregnant women,  and people with certain health conditions or a weakened immune system are at greatest risk. Each year thousands of people in the [place] die from flu, and many more are hospitalized. Flu vaccine can:  keep you from getting flu,  make flu less severe if you do get it, and  keep you from spreading flu to your family and other people. 2. Inactivated and recombinant flu vaccines A dose of flu vaccine is recommended every flu season. Children 6 months through 25 years of age may need two doses during the same flu season. Everyone else needs only one dose each flu season. Some inactivated flu vaccines contain a very small amount of a mercury-based preservative called thimerosal. Studies have not shown thimerosal in vaccines to be harmful, but flu vaccines that do not contain thimerosal are available. There is no live flu virus in flu shots. They cannot cause the flu. There are many flu viruses, and they are always changing. Each year a new flu vaccine is made to protect against three or four viruses that are likely to cause disease in the upcoming flu season. But even when the vaccine doesn't exactly match these viruses, it may still provide some protection. Flu vaccine cannot prevent:  flu that is caused by a virus not covered by the vaccine, or  illnesses that look like flu but are not.  It takes about 2 weeks for protection to develop after vaccination, and protection lasts through the flu season. 3. Some people should not get this vaccine Tell the person who is giving you the vaccine:  If you have any severe, life-threatening  allergies. If you ever had a life-threatening allergic reaction after a dose of flu vaccine, or have a severe allergy to any part of this vaccine, you may be advised not to get vaccinated. Most, but not all, types of flu vaccine contain a small amount of egg protein.  If you ever had Guillain-Barr Syndrome (also called GBS). Some people with a history of GBS should not get this vaccine. This should be discussed with your doctor.  If you are not feeling well. It is usually okay to get flu vaccine when you have a mild illness, but you might be asked to come back when you feel better.  4. Risks of a vaccine reaction With any medicine, including vaccines, there is a chance of reactions. These are usually mild and go away on their own, but serious reactions are also possible. Most people who get a flu shot do not have any problems with it. Minor problems following a flu shot include:  soreness, redness, or swelling where the shot was given  hoarseness  sore, red or itchy eyes  cough  fever  aches  headache  itching  fatigue  If these problems occur, they usually begin soon after the shot and last 1 or 2 days. More serious problems following a flu shot can include the following:  There may be a small increased risk of Guillain-Barre Syndrome (GBS) after inactivated flu vaccine. This risk has been estimated at 1 or 2 additional cases per million people vaccinated. This is much lower than the risk of severe complications from flu, which can be prevented by flu vaccine.  Young children who get the flu shot along with pneumococcal vaccine (PCV13) and/or DTaP vaccine at the same time might be slightly more likely to have a seizure caused by fever. Ask your doctor for more information. Tell your doctor if a child who is getting  flu vaccine has ever had a seizure.  Problems that could happen after any injected vaccine:  People sometimes faint after a medical procedure, including  vaccination. Sitting or lying down for about 15 minutes can help prevent fainting, and injuries caused by a fall. Tell your doctor if you feel dizzy, or have vision changes or ringing in the ears.  Some people get severe pain in the shoulder and have difficulty moving the arm where a shot was given. This happens very rarely.  Any medication can cause a severe allergic reaction. Such reactions from a vaccine are very rare, estimated at about 1 in a million doses, and would happen within a few minutes to a few hours after the vaccination. As with any medicine, there is a very remote chance of a vaccine causing a serious injury or death. The safety of vaccines is always being monitored. For more information, visit: http://www.aguilar.org/ 5. What if there is a serious reaction? What should I look for? Look for anything that concerns you, such as signs of a severe allergic reaction, very high fever, or unusual behavior. Signs of a severe allergic reaction can include hives, swelling of the face and throat, difficulty breathing, a fast heartbeat, dizziness, and weakness. These would start a few minutes to a few hours after the vaccination. What should I do?  If you think it is a severe allergic reaction or other emergency that can't wait, call 9-1-1 and get the person to the nearest hospital. Otherwise, call your doctor.  Reactions should be reported to the Vaccine Adverse Event Reporting System (VAERS). Your doctor should file this report, or you can do it yourself through the VAERS web site at www.vaers.SamedayNews.es, or by calling (715)421-7021. ? VAERS does not give medical advice. 6. The National Vaccine Injury Compensation Program The [employer] Vaccine Injury Compensation Program (VICP) is a federal program that was created to compensate people who may have been injured by certain vaccines. Persons who believe they may have been injured by a vaccine can learn about the program and about filing a  claim by calling (434)191-3759 or visiting the [place] website at GoldCloset.com.ee. There is a time limit to file a claim for compensation. 7. How can I learn more?  Ask your healthcare provider. He or she can give you the vaccine package insert or suggest other sources of information.  Call your local or state health department.  Contact the Centers for Disease Control and Prevention (CDC): ? Call 985-746-3377 (1-800-CDC-INFO) or ? Visit CDC's website at https://gibson.com/ Vaccine Information Statement, Inactivated Influenza Vaccine (07/03/2014) This information is not intended to replace advice given to you by your health care provider. Make sure you discuss any questions you have with your health care provider. Document Released: 09/07/2006 Document Revised: 08/03/2016 Document Reviewed: 08/03/2016 Elsevier Interactive Patient Education  2017 [employer].

## 2018-10-15 ENCOUNTER — Ambulatory Visit (INDEPENDENT_AMBULATORY_CARE_PROVIDER_SITE_OTHER): Payer: Medicare Other | Admitting: Family Medicine

## 2018-10-15 ENCOUNTER — Encounter: Payer: Self-pay | Admitting: Family Medicine

## 2018-10-15 DIAGNOSIS — S39012A Strain of muscle, fascia and tendon of lower back, initial encounter: Secondary | ICD-10-CM | POA: Diagnosis not present

## 2018-10-15 MED ORDER — CYCLOBENZAPRINE HCL 5 MG PO TABS: 5.0000 mg | ORAL_TABLET | Freq: Three times a day (TID) | ORAL | 1 refills | Status: DC | PRN

## 2018-10-15 NOTE — Patient Instructions (Signed)
Apply ice only today and tomorrow, then start alternating heat and ice starting on Thursday

## 2018-10-15 NOTE — Progress Notes (Signed)
Patient: Gabrielle Salinas Female    DOB: 02/18/1951   67 y.o.   MRN: [id number] Visit Date: 10/15/2018  Today's Provider: [name], MD   Chief Complaint  Patient presents with  . Back Pain   Subjective:    Back Pain  This is a chronic problem. Episode onset: flared up 2 days ago. The problem has been waxing and waning since onset. Pain location: upper back. 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 Pertinent negatives include no abdominal pain, chest pain, fever or weakness. She has tried heat for the symptoms. The treatment provided mild relief.  no pain radiating into legs are extremities. No weakness of extremities. No numbness of extremities. No relief with OTC pain relievers.      Allergies  Allergen Reactions  . Bactrim [Sulfamethoxazole-Trimethoprim] Itching  . Daypro  [Oxaprozin]   . Sulfa Antibiotics Diarrhea  . Sulfasalazine      Current Outpatient Medications:  .  acetaminophen (TYLENOL) 325 MG tablet, Take 650 mg by mouth every 4 (four) hours as needed., Disp: , Rfl:  .  alendronate (FOSAMAX) 70 MG tablet, TAKE 1 TABLET BY MOUTH ONE TIME PER WEEK WITH FULL GLASS OF WATER ON EMPTY STOMACH, Disp: 12 tablet, Rfl: 4 .  ALPRAZolam (XANAX) 0.25 MG tablet, TAKE 1 TO 2 TABLETS BY MOUTH EVERY 4 HOURS AS NEEDED FOR ANXIETY (NOT TO EXCEED 4 PER DAY), Disp: 30 tablet, Rfl: 5 .  Calcium Carb-Cholecalciferol (CALCIUM PLUS VITAMIN D3) 600-500 MG-UNIT CAPS, Take 600 Units by mouth daily., Disp: , Rfl:  .  cholecalciferol (VITAMIN D) 1000 units tablet, Take 1 tablet by mouth daily., Disp: , Rfl:  .  clorazepate (TRANXENE) 15 MG tablet, TAKE 1/2 TABLET BY MOUTH 3 TIMES A DAY AS NEEDED, Disp: 30 tablet, Rfl: 2 .  diclofenac sodium (VOLTAREN) 1 % GEL, Apply 2 g topically 4 (four) times daily., Disp: , Rfl:  .  esomeprazole (NEXIUM) 40 MG capsule, Take 1 capsule (40 mg total) by mouth daily at 12 noon., Disp: 30 capsule, Rfl:  5 .  FLUoxetine (PROZAC) 20 MG capsule, TAKE 1 CAPSULE BY MOUTH EVERY DAY, Disp: 90 capsule, Rfl: 4 .  lisinopril-hydrochlorothiazide (PRINZIDE,ZESTORETIC) 10-12.5 MG tablet, TAKE 1 TABLET BY MOUTH EVERY DAY, Disp: 30 tablet, Rfl: 12 .  Misc Natural Products (JOINT SUPPORT PO), Take by mouth., Disp: , Rfl:  .  montelukast (SINGULAIR) 10 MG tablet, TAKE 1 TABLET (10 MG TOTAL) BY MOUTH DAILY. FOR ALLERGIES, Disp: 30 tablet, Rfl: 11 .  Multiple Vitamins-Minerals (MULTIVITAMIN ADULT PO), Take 1 tablet by mouth daily., Disp: , Rfl:  .  Omega-3 Fatty Acids (FISH OIL) 1000 MG CAPS, Take 1 capsule by mouth daily. , Disp: , Rfl:  .  triamcinolone cream (KENALOG) 0.5 %, Apply 1 application topically daily as needed., Disp: , Rfl:  .  TURMERIC PO, Take by mouth., Disp: , Rfl:  .  omega-3 acid ethyl esters (LOVAZA) 1 g capsule, Take 2 g by mouth 2 (two) times daily., Disp: , Rfl:   Review of Systems  Constitutional: Negative for appetite change, chills, fatigue and fever.  Respiratory: Negative for chest tightness and shortness of breath.   Cardiovascular: Negative for chest pain and palpitations.  Gastrointestinal: Negative for abdominal pain, nausea and vomiting.  Musculoskeletal: Positive for back pain.  Neurological: Negative for dizziness and weakness.    Social History   Tobacco Use  . Smoking status: Never  Smoker  . Smokeless tobacco: Never Used  Substance Use Topics  . Alcohol use: Yes    Alcohol/week: 0.0 standard drinks    Comment: 1-2 glasses of wine a month / occasional   Objective:   BP 140/80 (BP Location: Left Arm, Patient Position: Sitting, Cuff Size: Large)   Pulse 76   Temp 97.7 F (36.5 C) (Oral)   Resp 16   Wt 179 lb (81.2 kg)   LMP 11/27/1980   BMI 32.74 kg/m  Vitals:   10/15/18 1433  BP: 140/80  Pulse: 76  Resp: 16  Temp: 97.7 F (36.5 C)  TempSrc: Oral  Weight: 179 lb (81.2 kg)     Physical Exam   General Appearance:    Alert, cooperative, no distress   Eyes:    PERRL, conjunctiva/corneas clear, EOM's intact       Lungs:     Clear to auscultation bilaterally, respirations unlabored  Heart:    Regular rate and rhythm  MS:   A tender along bilateral para-thoracic musculature.          Assessment & Plan:     1. Back strain, initial encounter She has taking cyclobenzaprine in the past which she thought worked well.  - cyclobenzaprine (FLEXERIL) 5 MG tablet; Take 1 tablet (5 mg total) by mouth 3 (three) times daily as needed for muscle spasms.  Dispense: 30 tablet; Refill: 1  Patient Instructions  Apply ice only today and tomorrow, then start alternating heat and ice starting on Thursday  Call if symptoms change or if not rapidly improving.          Mila Merryonald Sam Wunschel, MD  [facility]

## 2018-10-17 ENCOUNTER — Telehealth: Payer: Self-pay | Admitting: Family Medicine

## 2018-10-17 NOTE — Telephone Encounter (Signed)
Pt states the muscle relaxer she was given on 10/15/18 isn't helping her back.  Pt is wanting to know if there is anything else she can be given or if she needs to come back in.

## 2018-10-17 NOTE — Telephone Encounter (Signed)
Please advise 

## 2018-10-18 MED ORDER — METHOCARBAMOL 500 MG PO TABS: 500.0000 mg | ORAL_TABLET | Freq: Three times a day (TID) | ORAL | 1 refills | Status: DC | PRN

## 2018-10-18 NOTE — Telephone Encounter (Signed)
If still not better, can change cyclobenzaprine to methocarbamol 500mg , 1-2 tablets three times a day as needed, #30, rf x 1.

## 2018-10-18 NOTE — Telephone Encounter (Signed)
Pt advised.  RX sent to [facility] [address]  Thanks,   -  

## 2018-10-29 ENCOUNTER — Ambulatory Visit
Admission: RE | Admit: 2018-10-29 | Discharge: 2018-10-29 | Disposition: A | Discharge status: Home / Self Care | Payer: Medicare Other | Source: Ambulatory Visit | Attending: Family Medicine | Admitting: Family Medicine

## 2018-10-29 DIAGNOSIS — Z780 Asymptomatic menopausal state: Secondary | ICD-10-CM | POA: Insufficient documentation

## 2018-10-29 DIAGNOSIS — M85851 Other specified disorders of bone density and structure, right thigh: Secondary | ICD-10-CM | POA: Diagnosis not present

## 2018-11-08 ENCOUNTER — Encounter: Payer: Self-pay | Admitting: Family Medicine

## 2018-11-08 ENCOUNTER — Ambulatory Visit (INDEPENDENT_AMBULATORY_CARE_PROVIDER_SITE_OTHER): Payer: Medicare Other | Admitting: Family Medicine

## 2018-11-08 VITALS — BP 135/82 | HR 86 | Temp 98.2°F | Resp 16 | Ht 62.0 in | Wt 180.0 lb

## 2018-11-08 DIAGNOSIS — K219 Gastro-esophageal reflux disease without esophagitis: Secondary | ICD-10-CM | POA: Diagnosis not present

## 2018-11-08 DIAGNOSIS — M810 Age-related osteoporosis without current pathological fracture: Secondary | ICD-10-CM | POA: Diagnosis not present

## 2018-11-08 DIAGNOSIS — S39012A Strain of muscle, fascia and tendon of lower back, initial encounter: Secondary | ICD-10-CM

## 2018-11-08 DIAGNOSIS — I10 Essential (primary) hypertension: Secondary | ICD-10-CM | POA: Diagnosis not present

## 2018-11-08 DIAGNOSIS — Z0000 Encounter for general adult medical examination without abnormal findings: Secondary | ICD-10-CM

## 2018-11-08 DIAGNOSIS — F411 Generalized anxiety disorder: Secondary | ICD-10-CM

## 2018-11-08 DIAGNOSIS — E785 Hyperlipidemia, unspecified: Secondary | ICD-10-CM | POA: Diagnosis not present

## 2018-11-08 DIAGNOSIS — F329 Major depressive disorder, single episode, unspecified: Secondary | ICD-10-CM | POA: Diagnosis not present

## 2018-11-08 DIAGNOSIS — F32A Depression, unspecified: Secondary | ICD-10-CM

## 2018-11-08 DIAGNOSIS — R7303 Prediabetes: Secondary | ICD-10-CM | POA: Diagnosis not present

## 2018-11-08 DIAGNOSIS — M791 Myalgia, unspecified site: Secondary | ICD-10-CM

## 2018-11-08 DIAGNOSIS — G4700 Insomnia, unspecified: Secondary | ICD-10-CM

## 2018-11-08 MED ORDER — CYCLOBENZAPRINE HCL 5 MG PO TABS: 5.0000 mg | ORAL_TABLET | Freq: Three times a day (TID) | ORAL | 1 refills | Status: DC | PRN

## 2018-11-08 NOTE — Progress Notes (Signed)
Patient: Gabrielle Salinas, Female    DOB: 14-Dec-1950, 67 y.o.   MRN: [id number] Visit Date: 11/08/2018  Today's Provider: [name], MD   Chief Complaint  Patient presents with  . Annual Exam  . Anxiety  . Hyperlipidemia   Subjective:     Complete Physical Gabrielle Salinas is a 67 y.o. female. She feels fairly well. She reports exercising daily. She reports she is sleeping fairly well.  ----------------------------------------------------------- Follow up of Anxiety: Patient was last seen for this problem 4 months ago and no changes were made. Patient reports this problem is stable on current dose of medication. She feels that fluoxetine continues to work well and wishes to continue current dose.  She continues to take alprazolam occasionally during the day which does not make her sleepy, and tranxene at bedtime as needed which she finds very effective to help her sleep. She does not take the two together.  She is currently caring for her 2 year grand daughter which she feels is a significant contributer to her anxiety.    Lipid/Cholesterol, Follow-up:   Last seen for this 4  months ago.  Management changes since that visit include stopping Rosuvastatin due to muscle aches. . Last Lipid Panel:    Component Value Date/Time   CHOL 201 (H) 04/13/2017 1106   TRIG 161 (H) 04/13/2017 1106   HDL 65 04/13/2017 1106   CHOLHDL 3.1 04/13/2017 1106   LDLCALC 104 (H) 04/13/2017 1106    Risk factors for vascular disease include hypercholesterolemia  She reports good compliance with treatment. She is not having side effects.  Current symptoms include none and have been stable. Weight trend: stable Prior visit with dietician: no Current diet: well balanced Current exercise: walking  Wt Readings from Last 3 Encounters:  11/08/18 180 lb (81.6 kg)  10/15/18 179 lb (81.2 kg)  09/30/18 177 lb 6.4 oz (80.5 kg)     -------------------------------------------------------------------  Prediabetes, Follow-up:   Lab Results  Component Value Date   HGBA1C 6.3 (H) 07/03/2018   HGBA1C 5.9 (H) 04/13/2017   HGBA1C 5.8 (H) 03/14/2016   GLUCOSE 113 (H) 07/03/2018   GLUCOSE 112 (H) 04/13/2017   GLUCOSE 112 (H) 03/14/2016    Last seen for for this 4 months ago.  Management since that visit includes encouraging patient to avoid sweets and starchy foods. Patient was also advised to exercise 30 minutes daily. Current symptoms include none and have been stable.  Weight trend: stable Prior visit with dietician: no Current diet: well balanced Current exercise: walking  Pertinent Labs:    Component Value Date/Time   CHOL 201 (H) 04/13/2017 1106   TRIG 161 (H) 04/13/2017 1106   CHOLHDL 3.1 04/13/2017 1106   CREATININE 0.66 07/03/2018 1123    Wt Readings from Last 3 Encounters:  11/08/18 180 lb (81.6 kg)  10/15/18 179 lb (81.2 kg)  09/30/18 177 lb 6.4 oz (80.5 kg)   Myalgia: Patient was last seen for this problem 4 months ago. Changes made during that visit includes putting statin on hold. Patient reports her muscle pain has improved some since stopping the statin.  However she continues to have in her mid left thoracic muscles. States it did improve with cyclobenzaprine, but never resolved.    Review of Systems  Constitutional: Negative for chills, fatigue and fever.  HENT: Negative for congestion, ear pain, rhinorrhea, sneezing and sore throat.   Eyes: Negative.  Negative for pain and redness.  Respiratory: Negative for  cough, shortness of breath and wheezing.   Cardiovascular: Negative for chest pain and leg swelling.  Gastrointestinal: Negative for abdominal pain, blood in stool, constipation, diarrhea and nausea.  Endocrine: Negative for polydipsia and polyphagia.  Genitourinary: Positive for flank pain. Negative for dysuria, hematuria, pelvic pain, vaginal bleeding and vaginal discharge.   Musculoskeletal: Negative for arthralgias, back pain, gait problem and joint swelling.  Skin: Negative for rash.  Allergic/Immunologic: Positive for environmental allergies.  Neurological: Negative.  Negative for dizziness, tremors, seizures, weakness, light-headedness, numbness and headaches.  Hematological: Negative for adenopathy.  Psychiatric/Behavioral: Negative.  Negative for behavioral problems, confusion and dysphoric mood. The patient is not nervous/anxious and is not hyperactive.     Social History   Socioeconomic History  . Marital status: Divorced    Spouse name: Not on file  . Number of children: 3  . Years of education: Not on file  . Highest education level: Associate degree: academic program  Occupational History  . Occupation: retired    Comment: keeps granddaughter  Social Needs  . Financial resource strain: Somewhat hard  . Food insecurity:    Worry: Never true    Inability: Never true  . Transportation needs:    Medical: No    Non-medical: No  Tobacco Use  . Smoking status: Never Smoker  . Smokeless tobacco: Never Used  Substance and Sexual Activity  . Alcohol use: Yes    Alcohol/week: 0.0 standard drinks    Comment: 1-2 glasses of wine a month / occasional  . Drug use: Never  . Sexual activity: Not on file  Lifestyle  . Physical activity:    Days per week: 7 days    Minutes per session: 30 min  . Stress: Not on file  Relationships  . Social connections:    Talks on phone: More than three times a week    Gets together: More than three times a week    Attends religious service: More than 4 times per year    Active member of club or organization: Yes    Attends meetings of clubs or organizations: Never    Relationship status: Divorced  . Intimate partner violence:    Fear of current or ex partner: No    Emotionally abused: No    Physically abused: No    Forced sexual activity: No  Other Topics Concern  . Not on file  Social History Narrative   . Not on file    Past Medical History:  Diagnosis Date  . Allergy   . Anxiety   . Depression   . GERD (gastroesophageal reflux disease)   . Hyperlipidemia   . Hypertension   . Osteoporosis      Patient Active Problem List   Diagnosis Date Noted  . Osteoporosis 04/20/2016  . GERD (gastroesophageal reflux disease) 03/07/2016  . Anxiety state 03/07/2016  . Arthralgia 03/07/2016  . Right knee pain 03/07/2016  . Menopausal symptom 03/07/2016  . Contact dermatitis 03/07/2016  . Hemorrhage of gastrointestinal tract 10/17/2007  . Cervicalgia 10/17/2007  . HLD (hyperlipidemia) 03/21/2007  . Prediabetes 03/21/2007  . Allergic rhinitis due to pollen 11/27/2001  . Depressive disorder 11/27/2001  . Hypertension 11/27/1997    Past Surgical History:  Procedure Laterality Date  . BREAST BIOPSY Right <10 yrs ago   x2 benign results  . COLONOSCOPY  11/19/2017  . GI Bleed  2008  . RHINOPLASTY    . UPPER GASTROINTESTINAL ENDOSCOPY  10/2017   [facility]  . VAGINAL HYSTERECTOMY  1982   due to abnormal pap smears and menorrhagia    Her family history includes Diabetes in her father, sister, sister, and sister; Heart attack in her father; Lupus in her mother. There is no history of Breast cancer.      Current Outpatient Medications:  .  acetaminophen (TYLENOL) 325 MG tablet, Take 650 mg by mouth every 4 (four) hours as needed., Disp: , Rfl:  .  alendronate (FOSAMAX) 70 MG tablet, TAKE 1 TABLET BY MOUTH ONE TIME PER WEEK WITH FULL GLASS OF WATER ON EMPTY STOMACH, Disp: 12 tablet, Rfl: 4 .  ALPRAZolam (XANAX) 0.25 MG tablet, TAKE 1 TO 2 TABLETS BY MOUTH EVERY 4 HOURS AS NEEDED FOR ANXIETY (NOT TO EXCEED 4 PER DAY), Disp: 30 tablet, Rfl: 5 .  Calcium Carb-Cholecalciferol (CALCIUM PLUS VITAMIN D3) 600-500 MG-UNIT CAPS, Take 600 Units by mouth daily., Disp: , Rfl:  .  cholecalciferol (VITAMIN D) 1000 units tablet, Take 1 tablet by mouth daily., Disp: , Rfl:  .  clorazepate (TRANXENE) 15 MG  tablet, TAKE 1/2 TABLET BY MOUTH 3 TIMES A DAY AS NEEDED, Disp: 30 tablet, Rfl: 2 .  cyclobenzaprine (FLEXERIL) 5 MG tablet, Take 1 tablet (5 mg total) by mouth 3 (three) times daily as needed for muscle spasms., Disp: 30 tablet, Rfl: 1 .  diclofenac sodium (VOLTAREN) 1 % GEL, Apply 2 g topically 4 (four) times daily., Disp: , Rfl:  .  esomeprazole (NEXIUM) 40 MG capsule, Take 1 capsule (40 mg total) by mouth daily at 12 noon., Disp: 30 capsule, Rfl: 5 .  FLUoxetine (PROZAC) 20 MG capsule, TAKE 1 CAPSULE BY MOUTH EVERY DAY, Disp: 90 capsule, Rfl: 4 .  lisinopril-hydrochlorothiazide (PRINZIDE,ZESTORETIC) 10-12.5 MG tablet, TAKE 1 TABLET BY MOUTH EVERY DAY, Disp: 30 tablet, Rfl: 12 .  methocarbamol (ROBAXIN) 500 MG tablet, Take 1-2 tablets (500-1,000 mg total) by mouth 3 (three) times daily as needed for muscle spasms., Disp: 30 tablet, Rfl: 1 .  Misc Natural Products (JOINT SUPPORT PO), Take by mouth., Disp: , Rfl:  .  montelukast (SINGULAIR) 10 MG tablet, TAKE 1 TABLET (10 MG TOTAL) BY MOUTH DAILY. FOR ALLERGIES, Disp: 30 tablet, Rfl: 11 .  Multiple Vitamins-Minerals (MULTIVITAMIN ADULT PO), Take 1 tablet by mouth daily., Disp: , Rfl:  .  Omega-3 Fatty Acids (FISH OIL) 1000 MG CAPS, Take 1 capsule by mouth daily. , Disp: , Rfl:  .  triamcinolone cream (KENALOG) 0.5 %, Apply 1 application topically daily as needed., Disp: , Rfl:  .  TURMERIC PO, Take by mouth., Disp: , Rfl:  .  omega-3 acid ethyl esters (LOVAZA) 1 g capsule, Take 2 g by mouth 2 (two) times daily., Disp: , Rfl:   Patient Care Team: Malva Limes, MD as PCP - General (Family Medicine) Patty, A. Azucena Kuba, MD as Consulting Physician (Ophthalmology) Juanell Fairly, MD as Referring Physician (Orthopedic Surgery)     Objective:   Vitals: BP 135/82 (BP Location: Left Arm, Patient Position: Sitting, Cuff Size: Large)   Pulse 86   Temp 98.2 F (36.8 C) (Oral)   Resp 16   Ht 5\' 2"  (1.575 m)   Wt 180 lb (81.6 kg)   LMP 11/27/1980    SpO2 97% Comment: room air  BMI 32.92 kg/m   Physical Exam   General Appearance:    Alert, cooperative, no distress, appears stated age  Head:    Normocephalic, without obvious abnormality, atraumatic  Eyes:    PERRL, conjunctiva/corneas clear, EOM's intact, fundi  benign, both eyes  Ears:    Normal TM's and external ear canals, both ears  Nose:   Nares normal, septum midline, mucosa normal, no drainage    or sinus tenderness  Throat:   Lips, mucosa, and tongue normal; teeth and gums normal  Neck:   Supple, symmetrical, trachea midline, no adenopathy;    thyroid:  no enlargement/tenderness/nodules; no carotid   bruit or JVD  Back:     Symmetric, no curvature, ROM normal, no CVA tenderness  Lungs:     Clear to auscultation bilaterally, respirations unlabored  Chest Wall:    No tenderness or deformity   Heart:    Regular rate and rhythm, S1 and S2 normal, no murmur, rub   or gallop  Breast Exam:    normal appearance, no masses or tenderness  Abdomen:     Soft, non-tender, bowel sounds active all four quadrants,    no masses, no organomegaly  Pelvic:    deferred  Extremities:   Extremities normal, atraumatic, no cyanosis or edema. Mild scoliosis with tightness of left periahoracic muscles.   Pulses:   2+ and symmetric all extremities  Skin:   Skin color, texture, turgor normal, no rashes or lesions  Lymph nodes:   Cervical, supraclavicular, and axillary nodes normal  Neurologic:   CNII-XII intact, normal strength, sensation and reflexes    throughout    Activities of Daily Living In your present state of health, do you have any difficulty performing the following activities: 09/30/2018  Hearing? N  Comment declines hearing aids  Vision? N  Comment wears contacts and reading glasses  Difficulty concentrating or making decisions? N  Walking or climbing stairs? N  Dressing or bathing? N  Doing errands, shopping? N  Preparing Food and eating ? N  Using the Toilet? N  In the  past six months, have you accidently leaked urine? N  Do you have problems with loss of bowel control? N  Managing your Medications? N  Managing your Finances? N  Housekeeping or managing your Housekeeping? N  Some recent data might be hidden    Fall Risk Assessment Fall Risk  09/30/2018 09/28/2017 04/13/2017 03/14/2016  Falls in the past year? 0 Yes Yes Yes  Number falls in past yr: - 2 or more 1 1  Comment - knee gave out - -  Injury with Fall? - No Yes No  Follow up - Falls prevention discussed - -     Depression Screen PHQ 2/9 Scores 09/30/2018 09/28/2017 04/13/2017 03/14/2016  PHQ - 2 Score 0 0 0 1  PHQ- 9 Score - - 3 7    6CIT Screen 09/28/2017  What Year? 0 points  What month? 0 points  What time? 0 points  Count back from 20 0 points  Months in reverse 0 points  Repeat phrase 2 points  Total Score 2      Assessment & Plan:    Annual Physical Reviewed patient's Family Medical History Reviewed and updated list of patient's medical providers Assessment of cognitive impairment was done Assessed patient's functional ability Established a written schedule for health screening services Health Risk Assessent Completed and Reviewed  Exercise Activities and Dietary recommendations Goals    . DIET - INCREASE WATER INTAKE     Recommend 6-8 glasses of water per day    . Have 3 meals a day     Recommend eating 3 meals a day with 2 healthy snacks in between.  Immunization History  Administered Date(s) Administered  . Influenza, High Dose Seasonal PF 09/02/2016, 09/28/2017, 09/30/2018  . Pneumococcal Conjugate-13 07/30/2014  . Pneumococcal Polysaccharide-23 10/22/1998, 03/14/2016  . Td 07/23/1995  . Tdap 09/29/2011  . Zoster 07/30/2014    Health Maintenance  Topic Date Due  . MAMMOGRAM  07/03/2019  . TETANUS/TDAP  09/28/2021  . DEXA SCAN  10/29/2021  . COLONOSCOPY  11/20/2027  . INFLUENZA VACCINE  Completed  . Hepatitis C Screening  Completed  . PNA vac  Low Risk Adult  Completed     Discussed health benefits of physical activity, and encouraged her to engage in regular exercise appropriate for her age and condition.    ------------------------------------------------------------------------------------------------------------  1. Annual physical exam Reviewed routine HM.  - EKG 12-Lead  2. Essential hypertension Well controlled.  Continue current medications.   - EKG 12-Lead  3. Gastroesophageal reflux disease, esophagitis presence not specified Well controlled on current dose of esomeprazole.   4. Osteoporosis, unspecified osteoporosis type, unspecified pathological fracture presence  - VITAMIN D 25 Hydroxy (Vit-D Deficiency, Fractures)  5. Depressive disorder Doing well on fluoxetine. Continue current medications.    6. Hyperlipidemia, unspecified hyperlipidemia type Off statin due to myalgias. Check labs.  - Comprehensive metabolic panel - Lipid panel  7. Prediabetes  - Hemoglobin A1c  8. Back strain, initial encounter Likely secondary to scoliosis. She is followed by chiropractor. Recommended PT referral when her schedule allows.  - cyclobenzaprine (FLEXERIL) 5 MG tablet; Take 1 tablet (5 mg total) by mouth 3 (three) times daily as needed for muscle spasms.  Dispense: 30 tablet; Refill: 1  9. Myalgia Off statin and improved.   10. Anxiety state Continue prn alprazolam daytime only   11. Insomnia, unspecified type Continue prn tranxene hs only.   [name], MD  [facility]

## 2018-11-08 NOTE — Patient Instructions (Addendum)
The CDC recommends two doses of Shingrix (the shingles vaccine) separated by 2 to 6 months for adults age 69 years and older. I recommend checking with your pharmacy plan regarding coverage for this vaccine.    . It is recommended to engage in 150 minutes of moderate exercise every week.    DASH Eating Plan DASH stands for "Dietary Approaches to Stop Hypertension." The DASH eating plan is a healthy eating plan that has been shown to reduce high blood pressure (hypertension). It may also reduce your risk for type 2 diabetes, heart disease, and stroke. The DASH eating plan may also help with weight loss. What are tips for following this plan? General guidelines  Avoid eating more than 2,300 mg (milligrams) of salt (sodium) a day. If you have hypertension, you may need to reduce your sodium intake to 1,500 mg a day.  Limit alcohol intake to no more than 1 drink a day for nonpregnant women and 2 drinks a day for men. One drink equals 12 oz of beer, 5 oz of wine, or 1 oz of hard liquor.  Work with your health care provider to maintain a healthy body weight or to lose weight. Ask what an ideal weight is for you.  Get at least 30 minutes of exercise that causes your heart to beat faster (aerobic exercise) most days of the week. Activities may include walking, swimming, or biking.  Work with your health care provider or diet and nutrition specialist (dietitian) to adjust your eating plan to your individual calorie needs. Reading food labels  Check food labels for the amount of sodium per serving. Choose foods with less than 5 percent of the Daily Value of sodium. Generally, foods with less than 300 mg of sodium per serving fit into this eating plan.  To find whole grains, look for the word "whole" as the first word in the ingredient list. Shopping  Buy products labeled as "low-sodium" or "no salt added."  Buy fresh foods. Avoid canned foods and premade or frozen meals. Cooking  Avoid  adding salt when cooking. Use salt-free seasonings or herbs instead of table salt or sea salt. Check with your health care provider or pharmacist before using salt substitutes.  Do not fry foods. Cook foods using healthy methods such as baking, boiling, grilling, and broiling instead.  Cook with heart-healthy oils, such as olive, canola, soybean, or sunflower oil. Meal planning   Eat a balanced diet that includes: ? 5 or more servings of fruits and vegetables each day. At each meal, try to fill half of your plate with fruits and vegetables. ? Up to 6-8 servings of whole grains each day. ? Less than 6 oz of lean meat, poultry, or fish each day. A 3-oz serving of meat is about the same size as a deck of cards. One egg equals 1 oz. ? 2 servings of low-fat dairy each day. ? A serving of nuts, seeds, or beans 5 times each week. ? Heart-healthy fats. Healthy fats called Omega-3 fatty acids are found in foods such as flaxseeds and coldwater fish, like sardines, salmon, and mackerel.  Limit how much you eat of the following: ? Canned or prepackaged foods. ? Food that is high in trans fat, such as fried foods. ? Food that is high in saturated fat, such as fatty meat. ? Sweets, desserts, sugary drinks, and other foods with added sugar. ? Full-fat dairy products.  Do not salt foods before eating.  Try to eat at least  2 vegetarian meals each week.  Eat more home-cooked food and less restaurant, buffet, and fast food.  When eating at a restaurant, ask that your food be prepared with less salt or no salt, if possible. What foods are recommended? The items listed may not be a complete list. Talk with your dietitian about what dietary choices are best for you. Grains Whole-grain or whole-wheat bread. Whole-grain or whole-wheat pasta. Brown rice. [place]. Bulgur. Whole-grain and low-sodium cereals. Pita bread. Low-fat, low-sodium crackers. Whole-wheat flour tortillas. Vegetables Fresh or  frozen vegetables (raw, steamed, roasted, or grilled). Low-sodium or reduced-sodium tomato and vegetable juice. Low-sodium or reduced-sodium tomato sauce and tomato paste. Low-sodium or reduced-sodium canned vegetables. Fruits All fresh, dried, or frozen fruit. Canned fruit in natural juice (without added sugar). Meat and other protein foods Skinless chicken or [place]. Ground chicken or [place]. Pork with fat trimmed off. Fish and seafood. Egg whites. Dried beans, peas, or lentils. Unsalted nuts, nut butters, and seeds. Unsalted canned beans. Lean cuts of beef with fat trimmed off. Low-sodium, lean deli meat. Dairy Low-fat (1%) or fat-free (skim) milk. Fat-free, low-fat, or reduced-fat cheeses. Nonfat, low-sodium ricotta or cottage cheese. Low-fat or nonfat yogurt. Low-fat, low-sodium cheese. Fats and oils Soft margarine without trans fats. Vegetable oil. Low-fat, reduced-fat, or light mayonnaise and salad dressings (reduced-sodium). Canola, safflower, olive, soybean, and sunflower oils. Avocado. Seasoning and other foods Herbs. Spices. Seasoning mixes without salt. Unsalted popcorn and pretzels. Fat-free sweets. What foods are not recommended? The items listed may not be a complete list. Talk with your dietitian about what dietary choices are best for you. Grains Baked goods made with fat, such as croissants, muffins, or some breads. Dry pasta or rice meal packs. Vegetables Creamed or fried vegetables. Vegetables in a cheese sauce. Regular canned vegetables (not low-sodium or reduced-sodium). Regular canned tomato sauce and paste (not low-sodium or reduced-sodium). Regular tomato and vegetable juice (not low-sodium or reduced-sodium). Angie Fava. Olives. Fruits Canned fruit in a light or heavy syrup. Fried fruit. Fruit in cream or butter sauce. Meat and other protein foods Fatty cuts of meat. Ribs. Fried meat. Berniece Salines. Sausage. Bologna and other processed lunch meats. Salami. Fatback. Hotdogs.  Bratwurst. Salted nuts and seeds. Canned beans with added salt. Canned or smoked fish. Whole eggs or egg yolks. Chicken or [place] with skin. Dairy Whole or 2% milk, cream, and half-and-half. Whole or full-fat cream cheese. Whole-fat or sweetened yogurt. Full-fat cheese. Nondairy creamers. Whipped toppings. Processed cheese and cheese spreads. Fats and oils Butter. Stick margarine. Lard. Shortening. Ghee. Bacon fat. Tropical oils, such as coconut, palm kernel, or palm oil. Seasoning and other foods Salted popcorn and pretzels. Onion salt, garlic salt, seasoned salt, table salt, and sea salt. Worcestershire sauce. Tartar sauce. Barbecue sauce. Teriyaki sauce. Soy sauce, including reduced-sodium. Steak sauce. Canned and packaged gravies. Fish sauce. Oyster sauce. Cocktail sauce. Horseradish that you find on the shelf. Ketchup. Mustard. Meat flavorings and tenderizers. Bouillon cubes. Hot sauce and Tabasco sauce. Premade or packaged marinades. Premade or packaged taco seasonings. Relishes. Regular salad dressings. Where to find more information:  National Heart, Lung, and [place]: https://[place]-e[place].com/  American Heart Association: www.heart.org Summary  The DASH eating plan is a healthy eating plan that has been shown to reduce high blood pressure (hypertension). It may also reduce your risk for type 2 diabetes, heart disease, and stroke.  With the DASH eating plan, you should limit salt (sodium) intake to 2,300 mg a day. If you have hypertension, you may  need to reduce your sodium intake to 1,500 mg a day.  When on the DASH eating plan, aim to eat more fresh fruits and vegetables, whole grains, lean proteins, low-fat dairy, and heart-healthy fats.  Work with your health care provider or diet and nutrition specialist (dietitian) to adjust your eating plan to your individual calorie needs. This information is not intended to replace advice given to you by your health care provider. Make sure you  discuss any questions you have with your health care provider. Document Released: 11/02/2011 Document Revised: 11/06/2016 Document Reviewed: 11/06/2016 Elsevier Interactive Patient Education  [employer].

## 2018-11-09 LAB — COMPREHENSIVE METABOLIC PANEL
A/G RATIO: 1.4 (ref 1.2–2.2)
ALBUMIN: 4.2 g/dL (ref 3.6–4.8)
ALT: 27 IU/L (ref 0–32)
AST: 25 IU/L (ref 0–40)
Alkaline Phosphatase: 98 IU/L (ref 39–117)
BILIRUBIN TOTAL: 0.4 mg/dL (ref 0.0–1.2)
BUN / CREAT RATIO: 23 (ref 12–28)
BUN: 15 mg/dL (ref 8–27)
CALCIUM: 9.5 mg/dL (ref 8.7–10.3)
CHLORIDE: 96 mmol/L (ref 96–106)
CO2: 27 mmol/L (ref 20–29)
Creatinine, Ser: 0.66 mg/dL (ref 0.57–1.00)
GFR, EST AFRICAN AMERICAN: 104 mL/min/{1.73_m2} (ref >59)
GFR, EST NON AFRICAN AMERICAN: 90 mL/min/{1.73_m2} (ref >59)
GLOBULIN, TOTAL: 2.9 g/dL (ref 1.5–4.5)
Glucose: 104 mg/dL — ABNORMAL HIGH (ref 65–99)
POTASSIUM: 4.2 mmol/L (ref 3.5–5.2)
SODIUM: 138 mmol/L (ref 134–144)
TOTAL PROTEIN: 7.1 g/dL (ref 6.0–8.5)

## 2018-11-09 LAB — HEMOGLOBIN A1C
Est. average glucose Bld gHb Est-mCnc: 126 mg/dL
Hgb A1c MFr Bld: 6 % — ABNORMAL HIGH (ref 4.8–5.6)

## 2018-11-09 LAB — LIPID PANEL
CHOL/HDL RATIO: 4.1 ratio (ref 0.0–4.4)
Cholesterol, Total: 265 mg/dL — ABNORMAL HIGH (ref 100–199)
HDL: 64 mg/dL (ref >39)
LDL Calculated: 164 mg/dL — ABNORMAL HIGH (ref 0–99)
Triglycerides: 185 mg/dL — ABNORMAL HIGH (ref 0–149)
VLDL Cholesterol Cal: 37 mg/dL (ref 5–40)

## 2018-11-09 LAB — VITAMIN D 25 HYDROXY (VIT D DEFICIENCY, FRACTURES): Vit D, 25-Hydroxy: 27.5 ng/mL — ABNORMAL LOW (ref 30.0–100.0)

## 2018-11-11 ENCOUNTER — Telehealth: Payer: Self-pay

## 2018-11-11 DIAGNOSIS — E785 Hyperlipidemia, unspecified: Secondary | ICD-10-CM

## 2018-11-11 MED ORDER — EZETIMIBE 10 MG PO TABS: 10.0000 mg | ORAL_TABLET | Freq: Every day | ORAL | 5 refills | Status: DC

## 2018-11-11 NOTE — Telephone Encounter (Signed)
Patient advised as below. Patient states that she cannot take any statins. She reports that in the past, they made her ache all over to the point where she couldn't get out of bed. Patient wants to know if there is something else you recommend other than statins? Please advise.

## 2018-11-11 NOTE — Telephone Encounter (Signed)
-----   Message from Malva Limesonald E Fisher, MD sent at 11/11/2018  7:55 AM EST ----- LDL cholesterol is up from 104 to 164. Need to get back down around 100. Rest of labs are good. Need to try a different cholesterol medication. Try atorvastatin 10mg  once a day . #30, rf x 3 and schedule follow up 3 months.

## 2018-11-11 NOTE — Telephone Encounter (Signed)
Patient advised and agrees to try the medication listed below. Prescription sent into pharmacy. Follow up appointment scheduled 02/11/2019 at 10:20am.

## 2018-11-11 NOTE — Telephone Encounter (Signed)
Ok. Try ezetimibe 10mg  once a day, #30, rf x 5

## 2018-12-09 ENCOUNTER — Other Ambulatory Visit: Payer: Self-pay

## 2018-12-09 MED ORDER — ALPRAZOLAM 0.25 MG PO TABS: ORAL_TABLET | ORAL | 5 refills | Status: DC

## 2018-12-09 MED ORDER — CLORAZEPATE DIPOTASSIUM 15 MG PO TABS: ORAL_TABLET | ORAL | 2 refills | Status: DC

## 2019-01-14 ENCOUNTER — Other Ambulatory Visit: Payer: Self-pay | Admitting: Family Medicine

## 2019-02-11 ENCOUNTER — Telehealth: Payer: Self-pay | Admitting: Family Medicine

## 2019-02-11 ENCOUNTER — Ambulatory Visit: Payer: Self-pay | Admitting: Family Medicine

## 2019-02-11 NOTE — Telephone Encounter (Signed)
Pt needing a call back to let her know what Pneumonia shot she needs to be asking her insurance they will cover when she goes to the pharmacy.  [id number] [place], [place] - [address] AT [address] [phone] (Phone) [phone] (Fax)   Please advise.  Thanks, [employer]

## 2019-02-11 NOTE — Telephone Encounter (Signed)
I don't think she is due for a pneumonia vaccine Prevnar 13 was 07/30/2014 and pneumo-23 was 03/14/2016.  Do you think she means Shingles Vaccine?  Please check behind me in case I'm missing something.  Thanks,   -Vernona Rieger

## 2019-02-11 NOTE — Telephone Encounter (Signed)
She is up to date on pneumonia vaccine and doesn't need anymore. She could use the Shingrix

## 2019-02-12 NOTE — Telephone Encounter (Signed)
Pt advised.   Thanks,   -Terence Bart  

## 2019-02-18 ENCOUNTER — Telehealth: Payer: Self-pay

## 2019-02-26 ENCOUNTER — Telehealth: Payer: Self-pay | Admitting: *Deleted

## 2019-02-26 NOTE — Telephone Encounter (Signed)
Please advise 

## 2019-02-26 NOTE — Telephone Encounter (Signed)
Allergy nose sprays like OTC Flonase work the best. She could also try Fexofenadine [employer])

## 2019-02-26 NOTE — Telephone Encounter (Signed)
Patient returned call and rescheduled appt. Patient also stated so far she is doing fine on atorvastatin. Patient also wanted advise on which allergy medication she should take for nasal drainage? Please advise?

## 2019-02-27 NOTE — Telephone Encounter (Signed)
Spoke to patient regarding trying Flonase and Allegra for symptoms. She states that she is already taking Flonase but will try the Allegra.

## 2019-03-04 ENCOUNTER — Other Ambulatory Visit: Payer: Self-pay | Admitting: Family Medicine

## 2019-03-04 ENCOUNTER — Ambulatory Visit: Payer: Self-pay | Admitting: Family Medicine

## 2019-03-11 ENCOUNTER — Other Ambulatory Visit: Payer: Self-pay | Admitting: Family Medicine

## 2019-03-11 DIAGNOSIS — S39012A Strain of muscle, fascia and tendon of lower back, initial encounter: Secondary | ICD-10-CM

## 2019-03-12 ENCOUNTER — Other Ambulatory Visit: Payer: Self-pay | Admitting: Family Medicine

## 2019-04-08 ENCOUNTER — Other Ambulatory Visit: Payer: Self-pay | Admitting: Family Medicine

## 2019-04-23 ENCOUNTER — Other Ambulatory Visit: Payer: Self-pay | Admitting: Family Medicine

## 2019-04-23 DIAGNOSIS — E785 Hyperlipidemia, unspecified: Secondary | ICD-10-CM

## 2019-05-06 ENCOUNTER — Ambulatory Visit: Payer: Self-pay | Admitting: Family Medicine

## 2019-05-06 ENCOUNTER — Telehealth: Payer: Self-pay

## 2019-05-06 NOTE — Telephone Encounter (Signed)
Patient called and stated that [facility] needs two separate prescriptions send in for the Lisinopril and HCTZ due to the Zestoretic no longer been able. Please advise.

## 2019-05-06 NOTE — Progress Notes (Deleted)
Patient: Gabrielle Salinas Female    DOB: 07-30-1951   68 y.o.   MRN: [id number] Visit Date: 05/06/2019  Today's Provider: Lelon Huh, MD   No chief complaint on file.  Subjective:     HPI       Prediabetes, Follow-up:   Lab Results  Component Value Date   HGBA1C 6.0 (H) 11/08/2018   HGBA1C 6.3 (H) 07/03/2018   HGBA1C 5.9 (H) 04/13/2017   GLUCOSE 104 (H) 11/08/2018   GLUCOSE 113 (H) 07/03/2018   GLUCOSE 112 (H) 04/13/2017    Last seen for for this6 months ago.  Management since that visit includes no changes. Current symptoms include {Symptoms; diabetes:14075} and have been {Desc; course:15616}.  Weight trend: {trend:16658} Prior visit with dietician: {yes/no:17258} Current diet: {diet habits:16563} Current exercise: {exercise types:16438}  Pertinent Labs:    Component Value Date/Time   CHOL 265 (H) 11/08/2018 1129   TRIG 185 (H) 11/08/2018 1129   CHOLHDL 4.1 11/08/2018 1129   CREATININE 0.66 11/08/2018 1129    Wt Readings from Last 3 Encounters:  11/08/18 180 lb (81.6 kg)  10/15/18 179 lb (81.2 kg)  09/30/18 177 lb 6.4 oz (80.5 kg)      Hypertension, follow-up:  BP Readings from Last 3 Encounters:  11/08/18 135/82  10/15/18 140/80  09/30/18 122/82    She was last seen for hypertension 6 months ago.  BP at that visit was 135/82. Management since that visit includes no changes She reports {excellent/good/fair/poor:19665} compliance with treatment. She {ACTION; IS/IS PYP:95093267} having side effects. *** She {is/is not:9024} exercising. She {is/is not:9024} adherent to low salt diet.   Outside blood pressures are ***. She is experiencing {Symptoms; cardiac:12860}.  Patient denies {Symptoms; cardiac:12860}.   Cardiovascular risk factors include {cv risk factors:510}.  Use of agents associated with hypertension: {bp agents assoc with hypertension:511::"none"}.   ------------------------------------------------------------------------     Lipid/Cholesterol, Follow-up:   Last seen for this 6 months ago.  Management since that visit includes starting ezetimibe 10mg  a day.  Last Lipid Panel:    Component Value Date/Time   CHOL 265 (H) 11/08/2018 1129   TRIG 185 (H) 11/08/2018 1129   HDL 64 11/08/2018 1129   CHOLHDL 4.1 11/08/2018 1129   LDLCALC 164 (H) 11/08/2018 1129    She reports {excellent/good/fair/poor:19665} compliance with treatment. She {ACTION; IS/IS TIW:58099833} having side effects. ***  Wt Readings from Last 3 Encounters:  11/08/18 180 lb (81.6 kg)  10/15/18 179 lb (81.2 kg)  09/30/18 177 lb 6.4 oz (80.5 kg)    ------------------------------------------------------------------------    Allergies  Allergen Reactions  . Bactrim [Sulfamethoxazole-Trimethoprim] Itching  . Daypro  [Oxaprozin]   . Sulfa Antibiotics Diarrhea  . Sulfasalazine      Current Outpatient Medications:  .  acetaminophen (TYLENOL) 325 MG tablet, Take 650 mg by mouth every 4 (four) hours as needed., Disp: , Rfl:  .  alendronate (FOSAMAX) 70 MG tablet, TAKE 1 TABLET BY MOUTH ONE TIME PER WEEK WITH FULL GLASS OF WATER ON EMPTY STOMACH, Disp: 12 tablet, Rfl: 4 .  ALPRAZolam (XANAX) 0.25 MG tablet, TAKE 1 TO 2 TABLETS BY MOUTH EVERY 4 HOURS AS NEEDED FOR ANXIETY( NOT TO EXCEED 4 TABLETS DAILY), Disp: 30 tablet, Rfl: 5 .  Calcium Carb-Cholecalciferol (CALCIUM PLUS VITAMIN D3) 600-500 MG-UNIT CAPS, Take 600 Units by mouth daily., Disp: , Rfl:  .  cholecalciferol (VITAMIN D) 1000 units tablet, Take 1 tablet by mouth daily., Disp: , Rfl:  .  clorazepate (  TRANXENE) 15 MG tablet, TAKE 1/2 TABLET BY MOUTH THREE TIMES DAILY AS NEEDED, Disp: 30 tablet, Rfl: 3 .  cyclobenzaprine (FLEXERIL) 5 MG tablet, TAKE 1 TABLET BY MOUTH THREE TIMES A DAY AS NEEDED FOR MUSCLE SPASMS, Disp: 30 tablet, Rfl: 2 .  esomeprazole (NEXIUM) 40 MG capsule, TAKE 1 CAPSULE BY MOUTH EVERY DAY AT 12 NOON, Disp: 30 capsule, Rfl: 5 .  ezetimibe (ZETIA) 10 MG  tablet, TAKE 1 TABLET BY MOUTH EVERY DAY, Disp: 90 tablet, Rfl: 3 .  FLUoxetine (PROZAC) 20 MG capsule, TAKE 1 CAPSULE BY MOUTH EVERY DAY, Disp: 90 capsule, Rfl: 4 .  lisinopril-hydrochlorothiazide (ZESTORETIC) 10-12.5 MG tablet, TAKE 1 TABLET BY MOUTH EVERY DAY, Disp: 90 tablet, Rfl: 3 .  Misc Natural Products (JOINT SUPPORT PO), Take by mouth., Disp: , Rfl:  .  montelukast (SINGULAIR) 10 MG tablet, TAKE 1 TABLET BY MOUTH EVERY DAY FOR ALLERGIES, Disp: 30 tablet, Rfl: 11 .  Multiple Vitamins-Minerals (MULTIVITAMIN ADULT PO), Take 1 tablet by mouth daily., Disp: , Rfl:  .  Omega-3 Fatty Acids (FISH OIL) 1000 MG CAPS, Take 1 capsule by mouth daily. , Disp: , Rfl:  .  triamcinolone cream (KENALOG) 0.5 %, Apply 1 application topically daily as needed., Disp: , Rfl:  .  TURMERIC PO, Take by mouth., Disp: , Rfl:   Review of Systems  Social History   Tobacco Use  . Smoking status: Never Smoker  . Smokeless tobacco: Never Used  Substance Use Topics  . Alcohol use: Yes    Alcohol/week: 0.0 standard drinks    Comment: 1-2 glasses of wine a month / occasional      Objective:   LMP 11/27/1980  There were no vitals filed for this visit.   Physical Exam      Assessment & Plan        Mila Merryonald Fisher, MD  [facility]

## 2019-05-07 MED ORDER — HYDROCHLOROTHIAZIDE 12.5 MG PO TABS: 12.5000 mg | ORAL_TABLET | Freq: Every day | ORAL | 3 refills | Status: DC

## 2019-05-07 MED ORDER — LISINOPRIL 10 MG PO TABS: 10.0000 mg | ORAL_TABLET | Freq: Every day | ORAL | 3 refills | Status: DC

## 2019-05-09 ENCOUNTER — Telehealth: Payer: Self-pay | Admitting: Family Medicine

## 2019-05-09 MED ORDER — HYDROCHLOROTHIAZIDE 12.5 MG PO TABS: 12.5000 mg | ORAL_TABLET | Freq: Every day | ORAL | 3 refills | Status: DC

## 2019-05-09 MED ORDER — LISINOPRIL 10 MG PO TABS: 10.0000 mg | ORAL_TABLET | Freq: Every day | ORAL | 3 refills | Status: DC

## 2019-05-09 NOTE — Telephone Encounter (Signed)
[facility] faxed refill request for the following medications:  lisinopril-hydrochlorothiazide (PRINZIDE,ZESTORETIC) 10-12.5 MG tablet    Please advise.

## 2019-05-09 NOTE — Telephone Encounter (Signed)
Prescription for both of these was just sent to [facility] on Wednesday. Is she not using [facility]?

## 2019-05-09 NOTE — Telephone Encounter (Signed)
It looks like pt takes this in two sperate pills.    Please advise.   Thanks,   -Mickel Baas

## 2019-05-09 NOTE — Telephone Encounter (Signed)
Spoke with pt.  She states she picked up a 90 day supply at [facility] but would like to use [facility] going forward.  She says it will be a lot cheaper for her.   Thanks,   -Mickel Baas

## 2019-05-12 ENCOUNTER — Ambulatory Visit: Payer: Self-pay | Admitting: Family Medicine

## 2019-05-19 ENCOUNTER — Other Ambulatory Visit: Payer: Self-pay

## 2019-05-19 ENCOUNTER — Encounter: Payer: Self-pay | Admitting: Family Medicine

## 2019-05-19 ENCOUNTER — Ambulatory Visit (INDEPENDENT_AMBULATORY_CARE_PROVIDER_SITE_OTHER): Payer: Medicare Other | Admitting: Family Medicine

## 2019-05-19 VITALS — BP 120/78 | HR 85 | Temp 98.4°F | Resp 16 | Wt 184.0 lb

## 2019-05-19 DIAGNOSIS — R5383 Other fatigue: Secondary | ICD-10-CM

## 2019-05-19 DIAGNOSIS — Z6833 Body mass index (BMI) 33.0-33.9, adult: Secondary | ICD-10-CM

## 2019-05-19 DIAGNOSIS — E785 Hyperlipidemia, unspecified: Secondary | ICD-10-CM

## 2019-05-19 DIAGNOSIS — E669 Obesity, unspecified: Secondary | ICD-10-CM | POA: Diagnosis not present

## 2019-05-19 DIAGNOSIS — R7303 Prediabetes: Secondary | ICD-10-CM

## 2019-05-19 LAB — POCT GLYCOSYLATED HEMOGLOBIN (HGB A1C)
Est. average glucose Bld gHb Est-mCnc: 128
Hemoglobin A1C: 6.1 % — AB (ref 4.0–5.6)

## 2019-05-19 MED ORDER — TOPIRAMATE 50 MG PO TABS: ORAL_TABLET | ORAL | 2 refills | Status: DC

## 2019-05-19 MED ORDER — EZETIMIBE 10 MG PO TABS: 10.0000 mg | ORAL_TABLET | Freq: Every day | ORAL | 3 refills | Status: DC

## 2019-05-19 MED ORDER — PHENTERMINE HCL 15 MG PO CAPS: 15.0000 mg | ORAL_CAPSULE | ORAL | 2 refills | Status: DC

## 2019-05-19 NOTE — Patient Instructions (Signed)
.   Please review the attached list of medications and notify my office if there are any errors.   . Please bring all of your medications to every appointment so we can make sure that our medication list is the same as yours.   

## 2019-05-19 NOTE — Progress Notes (Signed)
Patient: Gabrielle Salinas Female    DOB: Feb 10, 1951   68 y.o.   MRN: [id number] Visit Date: 05/19/2019  Today's Provider: Lelon Huh, MD   Chief Complaint  Patient presents with  . Hyperglycemia  . Hypertension  . Hyperlipidemia   Subjective:     HPI  Prediabetes, Follow-up:   Lab Results  Component Value Date   HGBA1C 6.0 (H) 11/08/2018   HGBA1C 6.3 (H) 07/03/2018   HGBA1C 5.9 (H) 04/13/2017   GLUCOSE 104 (H) 11/08/2018   GLUCOSE 113 (H) 07/03/2018   GLUCOSE 112 (H) 04/13/2017    Last seen for for this 6 months ago.  Management since that visit includes no changes. Current symptoms include none and have been stable.  Weight trend: fluctuating a bit Prior visit with dietician: no Current diet: well balanced Current exercise: walking  Pertinent Labs:    Component Value Date/Time   CHOL 265 (H) 11/08/2018 1129   TRIG 185 (H) 11/08/2018 1129   CHOLHDL 4.1 11/08/2018 1129   CREATININE 0.66 11/08/2018 1129    Wt Readings from Last 3 Encounters:  05/19/19 184 lb (83.5 kg)  11/08/18 180 lb (81.6 kg)  10/15/18 179 lb (81.2 kg)    Lipid/Cholesterol, Follow-up:   Last seen for this 6 months ago.  Management changes since that visit include starting Ezetimibe 10mg  daily. . Last Lipid Panel:    Component Value Date/Time   CHOL 265 (H) 11/08/2018 1129   TRIG 185 (H) 11/08/2018 1129   HDL 64 11/08/2018 1129   CHOLHDL 4.1 11/08/2018 1129   LDLCALC 164 (H) 11/08/2018 1129    Risk factors for vascular disease include hypercholesterolemia and hypertension  She reports fair compliance with treatment. She is not having side effects.  Current symptoms include none and have been stable. Weight trend: fluctuating a bit Prior visit with dietician: no Current diet: well balanced Current exercise: walking  Wt Readings from Last 3 Encounters:  05/19/19 184 lb (83.5 kg)  11/08/18 180 lb (81.6 kg)  10/15/18 179 lb (81.2 kg)     -------------------------------------------------------------------  Hypertension, follow-up:  BP Readings from Last 3 Encounters:  05/19/19 120/78  11/08/18 135/82  10/15/18 140/80    She was last seen for hypertension 6 months ago.  BP at that visit was 135/82. Management since that visit includes none. She reports good compliance with treatment. She is not having side effects.  She is exercising. She is not adherent to low salt diet.   Outside blood pressures are  120/80. She is experiencing none.  Patient denies chest pain, chest pressure/discomfort, claudication, dyspnea, exertional chest pressure/discomfort, fatigue, irregular heart beat, lower extremity edema, near-syncope, orthopnea, palpitations, paroxysmal nocturnal dyspnea, syncope and tachypnea.   Cardiovascular risk factors include advanced age (older than 38 for men, 59 for women), dyslipidemia and hypertension.  Use of agents associated with hypertension: none.     Weight trend: fluctuating a bit Wt Readings from Last 3 Encounters:  05/19/19 184 lb (83.5 kg)  11/08/18 180 lb (81.6 kg)  10/15/18 179 lb (81.2 kg)    Current diet: well balanced  ------------------------------------------------------------------------ She does report fatigue and requests to have her b12 checked, her last tsh was 0.976 on 07/03/2018. She tried OTC b12 supplements which didn't really help.   She would also like something to help lose weight, she states she was prescribed medication at weight loss clinic several years ago which worked, but is too expensive to go back.  Allergies  Allergen Reactions  . Bactrim [Sulfamethoxazole-Trimethoprim] Itching  . Daypro  [Oxaprozin]   . Sulfa Antibiotics Diarrhea  . Sulfasalazine      Current Outpatient Medications:  .  acetaminophen (TYLENOL) 325 MG tablet, Take 650 mg by mouth every 4 (four) hours as needed., Disp: , Rfl:  .  alendronate (FOSAMAX) 70 MG tablet, TAKE 1 TABLET BY MOUTH  ONE TIME PER WEEK WITH FULL GLASS OF WATER ON EMPTY STOMACH, Disp: 12 tablet, Rfl: 4 .  ALPRAZolam (XANAX) 0.25 MG tablet, TAKE 1 TO 2 TABLETS BY MOUTH EVERY 4 HOURS AS NEEDED FOR ANXIETY( NOT TO EXCEED 4 TABLETS DAILY), Disp: 30 tablet, Rfl: 5 .  Calcium Carb-Cholecalciferol (CALCIUM PLUS VITAMIN D3) 600-500 MG-UNIT CAPS, Take 600 Units by mouth daily., Disp: , Rfl:  .  cholecalciferol (VITAMIN D) 1000 units tablet, Take 1 tablet by mouth daily., Disp: , Rfl:  .  clorazepate (TRANXENE) 15 MG tablet, TAKE 1/2 TABLET BY MOUTH THREE TIMES DAILY AS NEEDED, Disp: 30 tablet, Rfl: 3 .  cyclobenzaprine (FLEXERIL) 5 MG tablet, TAKE 1 TABLET BY MOUTH THREE TIMES A DAY AS NEEDED FOR MUSCLE SPASMS, Disp: 30 tablet, Rfl: 2 .  esomeprazole (NEXIUM) 40 MG capsule, TAKE 1 CAPSULE BY MOUTH EVERY DAY AT 12 NOON, Disp: 30 capsule, Rfl: 5 .  ezetimibe (ZETIA) 10 MG tablet, TAKE 1 TABLET BY MOUTH EVERY DAY, Disp: 90 tablet, Rfl: 3 .  FLUoxetine (PROZAC) 20 MG capsule, TAKE 1 CAPSULE BY MOUTH EVERY DAY, Disp: 90 capsule, Rfl: 4 .  hydrochlorothiazide (HYDRODIURIL) 12.5 MG tablet, Take 1 tablet (12.5 mg total) by mouth daily., Disp: 90 tablet, Rfl: 3 .  lisinopril (ZESTRIL) 10 MG tablet, Take 1 tablet (10 mg total) by mouth daily., Disp: 90 tablet, Rfl: 3 .  Misc Natural Products (JOINT SUPPORT PO), Take by mouth., Disp: , Rfl:  .  montelukast (SINGULAIR) 10 MG tablet, TAKE 1 TABLET BY MOUTH EVERY DAY FOR ALLERGIES, Disp: 30 tablet, Rfl: 11 .  Multiple Vitamins-Minerals (MULTIVITAMIN ADULT PO), Take 1 tablet by mouth daily., Disp: , Rfl:  .  Omega-3 Fatty Acids (FISH OIL) 1000 MG CAPS, Take 1 capsule by mouth daily. , Disp: , Rfl:  .  triamcinolone cream (KENALOG) 0.5 %, Apply 1 application topically daily as needed., Disp: , Rfl:  .  TURMERIC PO, Take by mouth., Disp: , Rfl:   Review of Systems  Constitutional: Negative for appetite change, chills, fatigue and fever.  Respiratory: Negative for chest tightness and  shortness of breath.   Cardiovascular: Negative for chest pain and palpitations.  Gastrointestinal: Negative for abdominal pain, nausea and vomiting.  Neurological: Negative for dizziness and weakness.    Social History   Tobacco Use  . Smoking status: Never Smoker  . Smokeless tobacco: Never Used  Substance Use Topics  . Alcohol use: Yes    Alcohol/week: 0.0 standard drinks    Comment: 1-2 glasses of wine a month / occasional      Objective:   BP 120/78 (BP Location: Left Arm, Patient Position: Sitting, Cuff Size: Large)   Pulse 85   Temp 98.4 F (36.9 C) (Oral)   Resp 16   Wt 184 lb (83.5 kg)   LMP 11/27/1980   SpO2 95% Comment: room air  BMI 33.65 kg/m  Vitals:   05/19/19 1117  BP: 120/78  Pulse: 85  Resp: 16  Temp: 98.4 F (36.9 C)  TempSrc: Oral  SpO2: 95%  Weight: 184 lb (83.5 kg)  Physical Exam   General Appearance:    Alert, cooperative, no distress  Eyes:    PERRL, conjunctiva/corneas clear, EOM's intact       Lungs:     Clear to auscultation bilaterally, respirations unlabored  Heart:    Regular rate and rhythm  Neurologic:   Awake, alert, oriented x 3. No apparent focal neurological           defect.        Results for orders placed or performed in visit on 05/19/19  POCT HgB A1C  Result Value Ref Range   Hemoglobin A1C 6.1 (A) 4.0 - 5.6 %   HbA1c POC (<> result, manual entry)     HbA1c, POC (prediabetic range)     HbA1c, POC (controlled diabetic range)     Est. average glucose Bld gHb Est-mCnc 128        Assessment & Plan       Mila Merryonald Fisher, MD  [facility]

## 2019-05-21 DIAGNOSIS — E785 Hyperlipidemia, unspecified: Secondary | ICD-10-CM | POA: Diagnosis not present

## 2019-05-22 LAB — CBC
Hematocrit: 42.1 % (ref 34.0–46.6)
Hemoglobin: 14.3 g/dL (ref 11.1–15.9)
MCH: 32.3 pg (ref 26.6–33.0)
MCHC: 34 g/dL (ref 31.5–35.7)
MCV: 95 fL (ref 79–97)
Platelets: 243 10*3/uL (ref 150–450)
RBC: 4.43 x10E6/uL (ref 3.77–5.28)
RDW: 13 % (ref 11.7–15.4)
WBC: 6.4 10*3/uL (ref 3.4–10.8)

## 2019-05-22 LAB — LDL CHOLESTEROL, DIRECT: LDL Direct: 136 mg/dL — ABNORMAL HIGH (ref 0–99)

## 2019-05-23 ENCOUNTER — Telehealth: Payer: Self-pay

## 2019-05-23 NOTE — Telephone Encounter (Signed)
Pt advised.   Thanks,   -Gabrielle Salinas  

## 2019-05-23 NOTE — Telephone Encounter (Signed)
-----   Message from Birdie Sons, MD sent at 05/22/2019  9:13 AM EDT ----- ldl is a little high at 136, but down from 164 in December. Goal is to get it under 100. Keep working on low saturated fat diet. Follow up in January as scheduled.

## 2019-06-29 ENCOUNTER — Other Ambulatory Visit: Payer: Self-pay | Admitting: Family Medicine

## 2019-07-18 ENCOUNTER — Other Ambulatory Visit: Payer: Self-pay | Admitting: Family Medicine

## 2019-08-14 ENCOUNTER — Other Ambulatory Visit: Payer: Self-pay | Admitting: Family Medicine

## 2019-08-14 DIAGNOSIS — Z6833 Body mass index (BMI) 33.0-33.9, adult: Secondary | ICD-10-CM

## 2019-08-14 DIAGNOSIS — E669 Obesity, unspecified: Secondary | ICD-10-CM

## 2019-08-14 NOTE — Telephone Encounter (Signed)
Please see if patient is taking one or two a day of topiramate. If taking two we can change to 100mg  daily

## 2019-08-17 ENCOUNTER — Other Ambulatory Visit: Payer: Self-pay | Admitting: Family Medicine

## 2019-08-18 NOTE — Telephone Encounter (Signed)
lmtcb

## 2019-08-18 NOTE — Telephone Encounter (Signed)
Pt is not taking this prescription right now.  The pharmacy must have done an automatic refill.  CB#  [phone]  Con Memos

## 2019-08-26 ENCOUNTER — Other Ambulatory Visit: Payer: Self-pay | Admitting: Family Medicine

## 2019-09-09 ENCOUNTER — Ambulatory Visit: Payer: Medicare Other

## 2019-09-16 ENCOUNTER — Other Ambulatory Visit: Payer: Self-pay | Admitting: Family Medicine

## 2019-09-24 DIAGNOSIS — H2513 Age-related nuclear cataract, bilateral: Secondary | ICD-10-CM | POA: Diagnosis not present

## 2019-09-28 HISTORY — PX: CATARACT EXTRACTION, BILATERAL: SHX1313

## 2019-09-29 DIAGNOSIS — H2512 Age-related nuclear cataract, left eye: Secondary | ICD-10-CM | POA: Diagnosis not present

## 2019-09-29 DIAGNOSIS — Z01818 Encounter for other preprocedural examination: Secondary | ICD-10-CM | POA: Diagnosis not present

## 2019-09-29 DIAGNOSIS — H43813 Vitreous degeneration, bilateral: Secondary | ICD-10-CM | POA: Diagnosis not present

## 2019-09-29 DIAGNOSIS — H2511 Age-related nuclear cataract, right eye: Secondary | ICD-10-CM | POA: Diagnosis not present

## 2019-09-29 DIAGNOSIS — H25812 Combined forms of age-related cataract, left eye: Secondary | ICD-10-CM | POA: Diagnosis not present

## 2019-10-01 NOTE — Progress Notes (Signed)
Subjective:   Gabrielle Salinas is a 68 y.o. female who presents for Medicare Annual (Subsequent) preventive examination.    This visit is being conducted through telemedicine due to the COVID-19 pandemic. This patient has given me verbal consent via doximity to conduct this visit, patient states they are participating from their home address. Some vital signs may be absent or patient reported.    Patient identification: identified by name, DOB, and current address  Review of Systems:  N/A  Cardiac Risk Factors include: advanced age (>31men, >27 women);dyslipidemia;hypertension     Objective:     Vitals: LMP 11/27/1980   There is no height or weight on file to calculate BMI. Unable to obtain vitals due to visit being conducted via telephonically.   Advanced Directives 10/02/2019 09/30/2018 09/28/2017  Does Patient Have a Medical Advance Directive? Yes No No  Type of Paramedic of [place];Living will - -  Copy of [place] in Chart? No - copy requested - -  Would patient like information on creating a medical advance directive? - Yes (MAU/Ambulatory/Procedural Areas - Information given) (No Data)    Tobacco Social History   Tobacco Use  Smoking Status Never Smoker  Smokeless Tobacco Never Used     Counseling given: Not Answered   Clinical Intake:  Pre-visit preparation completed: Yes  Pain : No/denies pain Pain Score: 0-No pain     Nutritional Risks: None Diabetes: No  How often do you need to have someone help you when you read instructions, pamphlets, or other written materials from your doctor or pharmacy?: 1 - Never  Interpreter Needed?: No  Information entered by :: [facility], LPN  Past Medical History:  Diagnosis Date  . Allergy   . Anxiety   . Cataract   . Depression   . GERD (gastroesophageal reflux disease)   . Hyperlipidemia   . Hypertension   . Osteoporosis    Past Surgical History:  Procedure  Laterality Date  . BREAST BIOPSY Right <10 yrs ago   x2 benign results  . COLONOSCOPY  11/19/2017  . GI Bleed  2008  . RHINOPLASTY    . UPPER GASTROINTESTINAL ENDOSCOPY  10/2017   [facility]  . VAGINAL HYSTERECTOMY  1982   due to abnormal pap smears and menorrhagia   Family History  Problem Relation Age of Onset  . Lupus Mother   . Heart attack Father   . Diabetes Father   . Diabetes Sister   . Diabetes Sister   . Diabetes Sister   . Breast cancer Neg Hx    Social History   Socioeconomic History  . Marital status: Divorced    Spouse name: Not on file  . Number of children: 3  . Years of education: Not on file  . Highest education level: Associate degree: academic program  Occupational History  . Occupation: retired    Comment: keeps granddaughter  Social Needs  . Financial resource strain: Not very hard  . Food insecurity    Worry: Never true    Inability: Never true  . Transportation needs    Medical: No    Non-medical: No  Tobacco Use  . Smoking status: Never Smoker  . Smokeless tobacco: Never Used  Substance and Sexual Activity  . Alcohol use: Not Currently    Alcohol/week: 0.0 standard drinks  . Drug use: Never  . Sexual activity: Not on file  Lifestyle  . Physical activity    Days per week: 0 days  Minutes per session: 0 min  . Stress: To some extent  Relationships  . Social Musician on phone: Patient refused    Gets together: Patient refused    Attends religious service: Patient refused    Active member of club or organization: Patient refused    Attends meetings of clubs or organizations: Patient refused    Relationship status: Patient refused  Other Topics Concern  . Not on file  Social History Narrative  . Not on file    Outpatient Encounter Medications as of 10/02/2019  Medication Sig  . acetaminophen (TYLENOL) 325 MG tablet Take 650 mg by mouth every 4 (four) hours as needed.  Marland Kitchen alendronate (FOSAMAX) 70 MG tablet TAKE 1 TABLET BY  MOUTH EVERY WEEK WITH A FULL GLASS OF WATER ON AN EMPTY STOMACH  . ALPRAZolam (XANAX) 0.25 MG tablet TAKE 1 TO 2 TABLETS BY MOUTH EVERY 4 HOURS AS NEEDED FOR ANXIETY. DO NOT EXCEED 4 TABLETS DAILY  . Ascorbic Acid (VITAMIN C) 100 MG tablet Take 100 mg by mouth daily.  . Calcium Carb-Cholecalciferol (CALCIUM PLUS VITAMIN D3) 600-500 MG-UNIT CAPS Take 600 Units by mouth daily.  . cholecalciferol (VITAMIN D) 1000 units tablet Take 1 tablet by mouth daily.  . clorazepate (TRANXENE) 15 MG tablet TAKE 1/2 TABLET BY MOUTH THREE TIMES DAILY AS NEEDED  . esomeprazole (NEXIUM) 40 MG capsule TAKE 1 CAPSULE BY MOUTH EVERY DAY AT NOON (Patient taking differently: Take 40 mg by mouth every other day. )  . ezetimibe (ZETIA) 10 MG tablet Take 1 tablet (10 mg total) by mouth daily.  Marland Kitchen FLUoxetine (PROZAC) 20 MG capsule TAKE 1 CAPSULE BY MOUTH EVERY DAY  . hydrochlorothiazide (HYDRODIURIL) 12.5 MG tablet Take 1 tablet (12.5 mg total) by mouth daily.  Marland Kitchen lisinopril (ZESTRIL) 10 MG tablet Take 1 tablet (10 mg total) by mouth daily.  . Misc Natural Products (JOINT SUPPORT PO) Take by mouth.  . montelukast (SINGULAIR) 10 MG tablet TAKE 1 TABLET BY MOUTH EVERY DAY FOR ALLERGIES  . Multiple Vitamins-Minerals (MULTIVITAMIN ADULT PO) Take 1 tablet by mouth daily.  . Multiple Vitamins-Minerals (PRESERVISION AREDS 2 PO) Take 1 tablet by mouth 2 (two) times daily.  . Omega-3 Fatty Acids (FISH OIL) 1000 MG CAPS Take 1 capsule by mouth daily.   Marland Kitchen triamcinolone cream (KENALOG) 0.5 % Apply 1 application topically daily as needed.  . vitamin E 1000 UNIT capsule Take by mouth daily. 1000 mg  . cyclobenzaprine (FLEXERIL) 5 MG tablet TAKE 1 TABLET BY MOUTH THREE TIMES A DAY AS NEEDED FOR MUSCLE SPASMS (Patient not taking: Reported on 10/02/2019)  . phentermine 15 MG capsule Take 1 capsule (15 mg total) by mouth every morning. (Patient not taking: Reported on 10/02/2019)  . topiramate (TOPAMAX) 50 MG tablet 1/2 tablet every morning for  14 days, then increase to 1 tablet every morning for 14 days, then 2 tablets every morning (Patient not taking: Reported on 10/02/2019)  . TURMERIC PO Take by mouth.   No facility-administered encounter medications on file as of 10/02/2019.     Activities of Daily Living In your present state of health, do you have any difficulty performing the following activities: 10/02/2019  Hearing? N  Vision? Y  Comment Due to cataracts and MD.  Difficulty concentrating or making decisions? N  Walking or climbing stairs? N  Dressing or bathing? N  Doing errands, shopping? N  Preparing Food and eating ? N  Using the Toilet? N  In  the past six months, have you accidently leaked urine? N  Do you have problems with loss of bowel control? N  Managing your Medications? N  Managing your Finances? N  Housekeeping or managing your Housekeeping? N  Some recent data might be hidden    Patient Care Team: Malva LimesFisher, Donald E, MD as PCP - General (Family Medicine) Patty, A. Azucena Kubaeid, MD as Consulting Physician (Ophthalmology) Juanell FairlyKrasinski, Kevin, MD as Referring Physician (Orthopedic Surgery) Elwin MochaShah, Christopher Tulip, MD as Consulting Physician (Ophthalmology)    Assessment:   This is a routine wellness examination for [name].  Exercise Activities and Dietary recommendations Current Exercise Habits: Home exercise routine, Type of exercise: walking, Time (Minutes): 45, Frequency (Times/Week): 5, Weekly Exercise (Minutes/Week): 225, Intensity: Mild, Exercise limited by: orthopedic condition(s)  Goals    . DIET - INCREASE WATER INTAKE     Recommend 6-8 glasses of water per day    . Have 3 meals a day     Recommend eating 3 meals a day with 2 healthy snacks in between.        Fall Risk: Fall Risk  10/02/2019 10/02/2019 09/30/2018 09/28/2017 04/13/2017  Falls in the past year? 0 0 0 Yes Yes  Number falls in past yr: 0 0 - 2 or more 1  Comment - - - knee gave out -  Injury with Fall? 0 0 - No Yes  Follow up - - -  Falls prevention discussed -    FALL RISK PREVENTION PERTAINING TO THE HOME:  Any stairs in or around the home? Yes  If so, are there any without handrails? Yes  Home free of loose throw rugs in walkways, pet beds, electrical cords, etc? Yes  Adequate lighting in your home to reduce risk of falls? Yes   ASSISTIVE DEVICES UTILIZED TO PREVENT FALLS:  Life alert? No  Use of a cane, walker or w/c? No  Grab bars in the bathroom? Yes  Shower chair or bench in shower? No  Elevated toilet seat or a handicapped toilet? No    TIMED UP AND GO:  Was the test performed? No .    Depression Screen PHQ 2/9 Scores 10/02/2019 09/30/2018 09/28/2017 04/13/2017  PHQ - 2 Score 0 0 0 0  PHQ- 9 Score - - - 3     Cognitive Function: Declined today.      6CIT Screen 09/28/2017  What Year? 0 points  What month? 0 points  What time? 0 points  Count back from 20 0 points  Months in reverse 0 points  Repeat phrase 2 points  Total Score 2    Immunization History  Administered Date(s) Administered  . Influenza, High Dose Seasonal PF 09/02/2016, 09/28/2017, 09/30/2018, 08/22/2019  . Pneumococcal Conjugate-13 07/30/2014  . Pneumococcal Polysaccharide-23 10/22/1998, 03/14/2016  . Td 07/23/1995  . Tdap 09/29/2011  . Zoster 07/30/2014  . Zoster Recombinat (Shingrix) 08/22/2019    Qualifies for Shingles Vaccine? Yes  Second dose due 11/21/19 or later.  Tdap: Up to date  Flu Vaccine: Up to date  Pneumococcal Vaccine: Completed series  Screening Tests Health Maintenance  Topic Date Due  . MAMMOGRAM  07/03/2019  . Fecal DNA (Cologuard)  04/30/2020  . TETANUS/TDAP  09/28/2021  . DEXA SCAN  10/29/2021  . INFLUENZA VACCINE  Completed  . Hepatitis C Screening  Completed  . PNA vac Low Risk Adult  Completed    Cancer Screenings:  Colorectal Screening: Cologuard completed 04/30/17. Repeat every 3 years.   Mammogram: Completed 07/02/17. Ordered  today. Pt provided with contact info and advised  to call to schedule appt. Pt aware the office will call re: appt.  Bone Density: Completed 10/29/18. Results reflect OSTEOPENIA. Repeat every 3 years.   Lung Cancer Screening: (Low Dose CT Chest recommended if Age 71-80 years, 30 pack-year currently smoking OR have quit w/in 15years.) does not qualify.   Additional Screening:  Hepatitis C Screening: Up to date  Vision Screening: Recommended annual ophthalmology exams for early detection of glaucoma and other disorders of the eye.  Dental Screening: Recommended annual dental exams for proper oral hygiene  Community Resource Referral:  CRR required this visit?  No       Plan:  I have personally reviewed and addressed the Medicare Annual Wellness questionnaire and have noted the following in the patient's chart:  A. Medical and social history B. Use of alcohol, tobacco or illicit drugs  C. Current medications and supplements D. Functional ability and status E.  Nutritional status F.  Physical activity G. Advance directives H. List of other physicians I.  Hospitalizations, surgeries, and ER visits in previous 12 months J.  Vitals K. Screenings such as hearing and vision if needed, cognitive and depression L. Referrals and appointments   In addition, I have reviewed and discussed with patient certain preventive protocols, quality metrics, and best practice recommendations. A written personalized care plan for preventive services as well as general preventive health recommendations were provided to patient. Nurse Health Advisor  Signed,    Oval Cavazos [place], [place]  16/11/958 Nurse Health Advisor   Nurse Notes: Mammogram order placed today.

## 2019-10-02 ENCOUNTER — Other Ambulatory Visit: Payer: Self-pay

## 2019-10-02 ENCOUNTER — Ambulatory Visit (INDEPENDENT_AMBULATORY_CARE_PROVIDER_SITE_OTHER): Payer: Medicare Other

## 2019-10-02 DIAGNOSIS — Z0000 Encounter for general adult medical examination without abnormal findings: Secondary | ICD-10-CM | POA: Diagnosis not present

## 2019-10-02 DIAGNOSIS — Z1231 Encounter for screening mammogram for malignant neoplasm of breast: Secondary | ICD-10-CM | POA: Diagnosis not present

## 2019-10-02 NOTE — Patient Instructions (Addendum)
[name] , Thank you for taking time to come for your Medicare Wellness Visit. I appreciate your ongoing commitment to your health goals. Please review the following plan we discussed and let me know if I can assist you in the future.   Screening recommendations/referrals: Colonoscopy: Cologuard up to date, due 04/30/20 Mammogram: Ordered today. Pt provided with contact info and advised to call to schedule appt. Pt aware the office will call re: appt. Bone Density: Up to date, due 10/2021 Recommended yearly ophthalmology/optometry visit for glaucoma screening and checkup Recommended yearly dental visit for hygiene and checkup  Vaccinations: Influenza vaccine: Up to date Pneumococcal vaccine: Completed series Tdap vaccine: Up to date, due 09/2021 Shingles vaccine: Shingrix # 2 due 10/22/19    Advanced directives: Please bring a copy of your POA (Power of [place]) and/or Living Will to your next appointment.   Conditions/risks identified: Continue to increase water intake to 6-8 8oz glasses a day.   Next appointment: 12/01/18 @ 10:20 AM with Dr Caryn Section. Declined scheduling an AWV for 2021 at this time.    Preventive Care 37 Years and Older, Female Preventive care refers to lifestyle choices and visits with your health care provider that can promote health and wellness. What does preventive care include?  A yearly physical exam. This is also called an annual well check.  Dental exams once or twice a year.  Routine eye exams. Ask your health care provider how often you should have your eyes checked.  Personal lifestyle choices, including:  Daily care of your teeth and gums.  Regular physical activity.  Eating a healthy diet.  Avoiding tobacco and drug use.  Limiting alcohol use.  Practicing safe sex.  Taking low-dose aspirin every day.  Taking vitamin and mineral supplements as recommended by your health care provider. What happens during an annual well check? The  services and screenings done by your health care provider during your annual well check will depend on your age, overall health, lifestyle risk factors, and family history of disease. Counseling  Your health care provider may ask you questions about your:  Alcohol use.  Tobacco use.  Drug use.  Emotional well-being.  Home and relationship well-being.  Sexual activity.  Eating habits.  History of falls.  Memory and ability to understand (cognition).  Work and work Statistician.  Reproductive health. Screening  You may have the following tests or measurements:  Height, weight, and BMI.  Blood pressure.  Lipid and cholesterol levels. These may be checked every 5 years, or more frequently if you are over 104 years old.  Skin check.  Lung cancer screening. You may have this screening every year starting at age 56 if you have a 30-pack-year history of smoking and currently smoke or have quit within the past 15 years.  Fecal occult blood test (FOBT) of the stool. You may have this test every year starting at age 89.  Flexible sigmoidoscopy or colonoscopy. You may have a sigmoidoscopy every 5 years or a colonoscopy every 10 years starting at age 13.  Hepatitis C blood test.  Hepatitis B blood test.  Sexually transmitted disease (STD) testing.  Diabetes screening. This is done by checking your blood sugar (glucose) after you have not eaten for a while (fasting). You may have this done every 1-3 years.  Bone density scan. This is done to screen for osteoporosis. You may have this done starting at age 49.  Mammogram. This may be done every 1-2 years. Talk to your health  care provider about how often you should have regular mammograms. Talk with your health care provider about your test results, treatment options, and if necessary, the need for more tests. Vaccines  Your health care provider may recommend certain vaccines, such as:  Influenza vaccine. This is recommended  every year.  Tetanus, diphtheria, and acellular pertussis (Tdap, Td) vaccine. You may need a Td booster every 10 years.  Zoster vaccine. You may need this after age 21.  Pneumococcal 13-valent conjugate (PCV13) vaccine. One dose is recommended after age 68.  Pneumococcal polysaccharide (PPSV23) vaccine. One dose is recommended after age 46. Talk to your health care provider about which screenings and vaccines you need and how often you need them. This information is not intended to replace advice given to you by your health care provider. Make sure you discuss any questions you have with your health care provider. Document Released: 12/10/2015 Document Revised: 08/02/2016 Document Reviewed: 09/14/2015 Elsevier Interactive Patient Education  2017 [place] Prevention in the Home Falls can cause injuries. They can happen to people of all ages. There are many things you can do to make your home safe and to help prevent falls. What can I do on the outside of my home?  Regularly fix the edges of walkways and driveways and fix any cracks.  Remove anything that might make you trip as you walk through a door, such as a raised step or threshold.  Trim any bushes or trees on the path to your home.  Use bright outdoor lighting.  Clear any walking paths of anything that might make someone trip, such as rocks or tools.  Regularly check to see if handrails are loose or broken. Make sure that both sides of any steps have handrails.  Any raised decks and porches should have guardrails on the edges.  Have any leaves, snow, or ice cleared regularly.  Use sand or salt on walking paths during winter.  Clean up any spills in your garage right away. This includes oil or grease spills. What can I do in the bathroom?  Use night lights.  Install grab bars by the toilet and in the tub and shower. Do not use towel bars as grab bars.  Use non-skid mats or decals in the tub or shower.  If  you need to sit down in the shower, use a plastic, non-slip stool.  Keep the floor dry. Clean up any water that spills on the floor as soon as it happens.  Remove soap buildup in the tub or shower regularly.  Attach bath mats securely with double-sided non-slip rug tape.  Do not have throw rugs and other things on the floor that can make you trip. What can I do in the bedroom?  Use night lights.  Make sure that you have a light by your bed that is easy to reach.  Do not use any sheets or blankets that are too big for your bed. They should not hang down onto the floor.  Have a firm chair that has side arms. You can use this for support while you get dressed.  Do not have throw rugs and other things on the floor that can make you trip. What can I do in the kitchen?  Clean up any spills right away.  Avoid walking on wet floors.  Keep items that you use a lot in easy-to-reach places.  If you need to reach something above you, use a strong step stool that has a grab  bar.  Keep electrical cords out of the way.  Do not use floor polish or wax that makes floors slippery. If you must use wax, use non-skid floor wax.  Do not have throw rugs and other things on the floor that can make you trip. What can I do with my stairs?  Do not leave any items on the stairs.  Make sure that there are handrails on both sides of the stairs and use them. Fix handrails that are broken or loose. Make sure that handrails are as long as the stairways.  Check any carpeting to make sure that it is firmly attached to the stairs. Fix any carpet that is loose or worn.  Avoid having throw rugs at the top or bottom of the stairs. If you do have throw rugs, attach them to the floor with carpet tape.  Make sure that you have a light switch at the top of the stairs and the bottom of the stairs. If you do not have them, ask someone to add them for you. What else can I do to help prevent falls?  Wear shoes  that:  Do not have high heels.  Have rubber bottoms.  Are comfortable and fit you well.  Are closed at the toe. Do not wear sandals.  If you use a stepladder:  Make sure that it is fully opened. Do not climb a closed stepladder.  Make sure that both sides of the stepladder are locked into place.  Ask someone to hold it for you, if possible.  Clearly mark and make sure that you can see:  Any grab bars or handrails.  First and last steps.  Where the edge of each step is.  Use tools that help you move around (mobility aids) if they are needed. These include:  Canes.  Walkers.  Scooters.  Crutches.  Turn on the lights when you go into a dark area. Replace any light bulbs as soon as they burn out.  Set up your furniture so you have a clear path. Avoid moving your furniture around.  If any of your floors are uneven, fix them.  If there are any pets around you, be aware of where they are.  Review your medicines with your doctor. Some medicines can make you feel dizzy. This can increase your chance of falling. Ask your doctor what other things that you can do to help prevent falls. This information is not intended to replace advice given to you by your health care provider. Make sure you discuss any questions you have with your health care provider. Document Released: 09/09/2009 Document Revised: 04/20/2016 Document Reviewed: 12/18/2014 Elsevier Interactive Patient Education  2017 [employer].

## 2019-10-13 DIAGNOSIS — H2512 Age-related nuclear cataract, left eye: Secondary | ICD-10-CM | POA: Diagnosis not present

## 2019-10-13 DIAGNOSIS — H25812 Combined forms of age-related cataract, left eye: Secondary | ICD-10-CM | POA: Diagnosis not present

## 2019-10-17 ENCOUNTER — Other Ambulatory Visit: Payer: Self-pay | Admitting: Family Medicine

## 2019-10-27 DIAGNOSIS — H2511 Age-related nuclear cataract, right eye: Secondary | ICD-10-CM | POA: Diagnosis not present

## 2019-10-27 DIAGNOSIS — H25811 Combined forms of age-related cataract, right eye: Secondary | ICD-10-CM | POA: Diagnosis not present

## 2019-10-27 DIAGNOSIS — H2512 Age-related nuclear cataract, left eye: Secondary | ICD-10-CM | POA: Diagnosis not present

## 2019-11-07 ENCOUNTER — Other Ambulatory Visit: Payer: Self-pay | Admitting: Family Medicine

## 2019-11-07 DIAGNOSIS — F411 Generalized anxiety disorder: Secondary | ICD-10-CM

## 2019-11-07 MED ORDER — FLUOXETINE HCL 20 MG PO CAPS: ORAL_CAPSULE | ORAL | 1 refills | Status: DC

## 2019-11-07 NOTE — Telephone Encounter (Signed)
Requested medication (s) are due for refill today: yes  Requested medication (s) are on the active medication list: yes  Last refill:  09/21/2018  Future visit scheduled: yes  Notes to clinic: review for refill and direction    Requested Prescriptions  Pending Prescriptions Disp Refills   FLUoxetine (PROZAC) 20 MG capsule      Sig: Take by mouth daily.      Psychiatry:  Antidepressants - SSRI Passed - 11/07/2019 11:13 AM      Passed - Valid encounter within last 6 months    Recent Outpatient Visits           5 months ago Prediabetes   [facility] Birdie Sons, MD   12 months ago Annual physical exam   [facility] Birdie Sons, MD   1 year ago Back strain, initial encounter   [facility] Birdie Sons, MD   1 year ago [place], Donald E, MD   1 year ago Acute maxillary sinusitis, recurrence not specified   [place], [place]       Future Appointments             In 3 weeks Caryn Section, Kirstie Peri, MD [facility], [place] - Completed PHQ-2 or PHQ-9 in the last 360 days.

## 2019-11-07 NOTE — Telephone Encounter (Signed)
Medication Refill - Medication: FLUoxetine (PROZAC) 20 MG capsule  Has the patient contacted their pharmacy? Yes - they have had no response from PCP (Agent: If no, request that the patient contact the pharmacy for the refill.) (Agent: If yes, when and what did the pharmacy advise?)  Preferred Pharmacy (with phone number or street name):  [id number] [address], [place] Phone:  [phone]  Fax:  [phone]     Agent: Please be advised that RX refills may take up to 3 business days. We ask that you follow-up with your pharmacy.

## 2019-12-02 ENCOUNTER — Ambulatory Visit: Payer: Self-pay | Admitting: Family Medicine

## 2019-12-20 ENCOUNTER — Other Ambulatory Visit: Payer: Self-pay | Admitting: Family Medicine

## 2019-12-27 ENCOUNTER — Other Ambulatory Visit: Payer: Self-pay | Admitting: Family Medicine

## 2019-12-27 NOTE — Telephone Encounter (Signed)
Requested medication (s) are due for refill today: yes  Requested medication (s) are on the active medication list: yes  Last refill:  07/21/19 #30 with 5 refills  Future visit scheduled: no  Notes to clinic: Refill not delegated per protocol.     Requested Prescriptions  Pending Prescriptions Disp Refills   clorazepate (TRANXENE) 15 MG tablet [Pharmacy Med Name: CLORAZEPATE 15MG  TABLETS] 30 tablet     Sig: TAKE 1/2 TABLET BY MOUTH THREE TIMES DAILY AS NEEDED      Not Delegated - Psychiatry:  Anxiolytics/Hypnotics Failed - 12/27/2019  1:31 PM      Failed - This refill cannot be delegated      Failed - Urine Drug Screen completed in last 360 days.      Passed - Valid encounter within last 6 months    Recent Outpatient Visits           7 months ago Prediabetes   [facility], [place]   1 year ago Annual physical exam   [facility], MD   1 year ago Back strain, initial encounter   [facility], MD   1 year ago Myalgia   [facility], MD   1 year ago Acute maxillary sinusitis, recurrence not specified   [facility] [place], [place], [place]

## 2020-01-02 ENCOUNTER — Other Ambulatory Visit: Payer: Self-pay | Admitting: Family Medicine

## 2020-01-02 NOTE — Telephone Encounter (Signed)
Requested medication (s) are due for refill today- end of month  Requested medication (s) are on the active medication list -yes  Future visit scheduled -no  Last refill: 09/17/19  Notes to clinic: Patient is requesting refill of non delegated Rx  Requested Prescriptions  Pending Prescriptions Disp Refills   ALPRAZolam (XANAX) 0.25 MG tablet [Pharmacy Med Name: ALPRAZOLAM 0.25MG  TABLETS] 30 tablet     Sig: TAKE 1 TO 2 TABLETS BY MOUTH EVERY 4 HOURS AS NEEDED FOR ANXIETY. DO NOT EXCEED 4 TABLETS DAILY      Not Delegated - Psychiatry:  Anxiolytics/Hypnotics Failed - 01/02/2020  9:28 AM      Failed - This refill cannot be delegated      Failed - Urine Drug Screen completed in last 360 days.      Passed - Valid encounter within last 6 months    Recent Outpatient Visits           7 months ago Prediabetes   [facility] Malva Limes, MD   1 year ago Annual physical exam   [facility] Malva Limes, MD   1 year ago Back strain, initial encounter   [facility] Malva Limes, MD   1 year ago Myalgia   [facility] Malva Limes, MD   1 year ago Acute maxillary sinusitis, recurrence not specified   [facility] [place], [place], [place]                  Requested Prescriptions  Pending Prescriptions Disp Refills   ALPRAZolam (XANAX) 0.25 MG tablet [Pharmacy Med Name: ALPRAZOLAM 0.25MG  TABLETS] 30 tablet     Sig: TAKE 1 TO 2 TABLETS BY MOUTH EVERY 4 HOURS AS NEEDED FOR ANXIETY. DO NOT EXCEED 4 TABLETS DAILY      Not Delegated - Psychiatry:  Anxiolytics/Hypnotics Failed - 01/02/2020  9:28 AM      Failed - This refill cannot be delegated      Failed - Urine Drug Screen completed in last 360 days.      Passed - Valid encounter within last 6 months    Recent Outpatient Visits           7 months ago Prediabetes   [facility] Malva Limes, MD   1 year ago Annual physical  exam   [facility] Malva Limes, MD   1 year ago Back strain, initial encounter   [facility] Malva Limes, MD   1 year ago Myalgia   [facility] Malva Limes, MD   1 year ago Acute maxillary sinusitis, recurrence not specified   [facility] [place], [place], [place]

## 2020-01-05 ENCOUNTER — Ambulatory Visit
Admission: RE | Admit: 2020-01-05 | Discharge: 2020-01-05 | Disposition: A | Discharge status: Home / Self Care | Payer: Medicare Other | Source: Ambulatory Visit | Attending: Family Medicine | Admitting: Family Medicine

## 2020-01-05 DIAGNOSIS — Z1231 Encounter for screening mammogram for malignant neoplasm of breast: Secondary | ICD-10-CM | POA: Diagnosis not present

## 2020-02-01 ENCOUNTER — Other Ambulatory Visit: Payer: Self-pay | Admitting: Family Medicine

## 2020-02-01 DIAGNOSIS — E669 Obesity, unspecified: Secondary | ICD-10-CM

## 2020-02-01 DIAGNOSIS — Z6833 Body mass index (BMI) 33.0-33.9, adult: Secondary | ICD-10-CM

## 2020-02-01 NOTE — Telephone Encounter (Signed)
Requested medication (s) are due for refill today: yes  Requested medication (s) are on the active medication list: yes  Last refill:  09/27/2019  Future visit scheduled: no  Notes to clinic:  This refill cannot be delegated   Requested Prescriptions  Pending Prescriptions Disp Refills   phentermine 15 MG capsule [Pharmacy Med Name: PHENTERMINE HCL 15MG  CAPSULES] 30 capsule     Sig: TAKE 1 CAPSULE(15 MG) BY MOUTH EVERY MORNING      Not Delegated - Gastroenterology:  Antiobesity Agents Failed - 02/01/2020 12:15 PM      Failed - This refill cannot be delegated      Passed - Last BP in normal range    BP Readings from Last 1 Encounters:  05/19/19 120/78          Passed - Last Heart Rate in normal range    Pulse Readings from Last 1 Encounters:  05/19/19 85          Passed - Valid encounter within last 12 months    Recent Outpatient Visits           8 months ago Prediabetes   [facility], MD   1 year ago Annual physical exam   [facility], MD   1 year ago Back strain, initial encounter   [facility], MD   1 year ago Myalgia   [facility], MD   2 years ago Acute maxillary sinusitis, recurrence not specified   [facility] [place], [place], [place]

## 2020-02-23 ENCOUNTER — Other Ambulatory Visit: Payer: Self-pay

## 2020-02-23 ENCOUNTER — Encounter: Payer: Self-pay | Admitting: Family Medicine

## 2020-02-23 ENCOUNTER — Ambulatory Visit (INDEPENDENT_AMBULATORY_CARE_PROVIDER_SITE_OTHER): Payer: Medicare Other | Admitting: Family Medicine

## 2020-02-23 VITALS — BP 124/82 | HR 84 | Temp 96.6°F | Resp 16 | Ht 63.0 in | Wt 180.0 lb

## 2020-02-23 DIAGNOSIS — F411 Generalized anxiety disorder: Secondary | ICD-10-CM

## 2020-02-23 DIAGNOSIS — E785 Hyperlipidemia, unspecified: Secondary | ICD-10-CM

## 2020-02-23 DIAGNOSIS — Z0000 Encounter for general adult medical examination without abnormal findings: Secondary | ICD-10-CM

## 2020-02-23 DIAGNOSIS — R7303 Prediabetes: Secondary | ICD-10-CM

## 2020-02-23 DIAGNOSIS — F32A Depression, unspecified: Secondary | ICD-10-CM

## 2020-02-23 DIAGNOSIS — F329 Major depressive disorder, single episode, unspecified: Secondary | ICD-10-CM

## 2020-02-23 DIAGNOSIS — M810 Age-related osteoporosis without current pathological fracture: Secondary | ICD-10-CM | POA: Diagnosis not present

## 2020-02-23 DIAGNOSIS — I10 Essential (primary) hypertension: Secondary | ICD-10-CM

## 2020-02-23 NOTE — Progress Notes (Signed)
Complete physical exam      Patient: Gabrielle Salinas   DOB: [birth year]   69 y.o. Female  MRN: [id number] Visit Date: 02/23/2020  Today's healthcare provider: [name], MD  Subjective:    Chief Complaint  Patient presents with  . Annual Exam  . Prediabetes  . Hypertension  . Hyperlipidemia  . Depression    Gabrielle Salinas is a 69 y.o. female who presents today for a complete physical exam. HPI   Prediabetes, Follow-up:   Lab Results  Component Value Date   HGBA1C 6.1 (A) 05/19/2019   HGBA1C 6.0 (H) 11/08/2018   HGBA1C 6.3 (H) 07/03/2018   GLUCOSE 104 (H) 11/08/2018   GLUCOSE 113 (H) 07/03/2018   GLUCOSE 112 (H) 04/13/2017    Last seen for for this 9 months ago.  Management since that visit includes no changes. Current symptoms include visual disturbances and have been unchanged.   Weight trend: stable Prior visit with dietician: no Current diet: well balanced Current exercise: walking  Pertinent Labs:    Component Value Date/Time   CHOL 265 (H) 11/08/2018 1129   TRIG 185 (H) 11/08/2018 1129   CHOLHDL 4.1 11/08/2018 1129   CREATININE 0.66 11/08/2018 1129    Wt Readings from Last 3 Encounters:  02/23/20 180 lb (81.6 kg)  05/19/19 184 lb (83.5 kg)  11/08/18 180 lb (81.6 kg)   --------------------------------------------------------------------------------  Hypertension, follow-up:  BP Readings from Last 3 Encounters:  02/23/20 124/82  05/19/19 120/78  11/08/18 135/82    She was last seen for hypertension more than 1  year ago.  BP at that visit was 135/82. Management since that visit includes no changes. She reports excellent compliance with treatment. She is not having side effects.  She is exercising. She is adherent to low salt diet.   Outside blood pressures are not being checked. She is experiencing none.  Patient denies chest pain, chest pressure/discomfort, claudication, dyspnea, exertional chest pressure/discomfort, fatigue,  irregular heart beat, lower extremity edema, near-syncope, orthopnea, palpitations, paroxysmal nocturnal dyspnea, syncope and tachypnea.   Cardiovascular risk factors include advanced age (older than 28 for men, 58 for women), dyslipidemia and hypertension.  Use of agents associated with hypertension: none.     Weight trend: stable Wt Readings from Last 3 Encounters:  02/23/20 180 lb (81.6 kg)  05/19/19 184 lb (83.5 kg)  11/08/18 180 lb (81.6 kg)    Current diet: well balanced  ------------------------------------------------------------------------  Lipid/Cholesterol, Follow-up:   Last seen for this 9 months ago.  Management changes since that visit include counseling patient to work on low saturated fat diet. . Last Lipid Panel:    Component Value Date/Time   CHOL 265 (H) 11/08/2018 1129   TRIG 185 (H) 11/08/2018 1129   HDL 64 11/08/2018 1129   CHOLHDL 4.1 11/08/2018 1129   LDLCALC 164 (H) 11/08/2018 1129   LDLDIRECT 136 (H) 05/21/2019 0944    Risk factors for vascular disease include hypercholesterolemia and hypertension  She reports excellent compliance with treatment. She is not having side effects.  Current symptoms include none and have been unchanged. Weight trend: stable Prior visit with dietician: no Current diet: well balanced Current exercise: running/ jogging and walking  Wt Readings from Last 3 Encounters:  02/23/20 180 lb (81.6 kg)  05/19/19 184 lb (83.5 kg)  11/08/18 180 lb (81.6 kg)    -------------------------------------------------------------------   Past Medical History:  Diagnosis Date  . Allergy   . Anxiety   . Cataract   .  Depression   . GERD (gastroesophageal reflux disease)   . Hyperlipidemia   . Hypertension   . Osteoporosis    Past Surgical History:  Procedure Laterality Date  . BREAST BIOPSY Right <10 yrs ago   x2 benign results  . COLONOSCOPY  11/19/2017  . GI Bleed  2008  . RHINOPLASTY    . UPPER GASTROINTESTINAL  ENDOSCOPY  10/2017   [facility]  . VAGINAL HYSTERECTOMY  1982   due to abnormal pap smears and menorrhagia   Social History   Socioeconomic History  . Marital status: Divorced    Spouse name: Not on file  . Number of children: 3  . Years of education: Not on file  . Highest education level: Associate degree: academic program  Occupational History  . Occupation: retired    Comment: keeps granddaughter  Tobacco Use  . Smoking status: Never Smoker  . Smokeless tobacco: Never Used  Substance and Sexual Activity  . Alcohol use: Not Currently    Alcohol/week: 0.0 standard drinks  . Drug use: Never  . Sexual activity: Not on file  Other Topics Concern  . Not on file  Social History Narrative  . Not on file   Social Determinants of Health   Financial Resource Strain: Low Risk   . Difficulty of Paying Living Expenses: Not very hard  Food Insecurity:   . Worried About Programme researcher, broadcasting/film/video in the Last Year:   . Barista in the Last Year:   Transportation Needs:   . Freight forwarder (Medical):   Marland Kitchen Lack of Transportation (Non-Medical):   Physical Activity: Inactive  . Days of Exercise per Week: 0 days  . Minutes of Exercise per Session: 0 min  Stress: Stress Concern Present  . Feeling of Stress : To some extent  Social Connections: Unknown  . Frequency of Communication with Friends and Family: Patient refused  . Frequency of Social Gatherings with Friends and Family: Patient refused  . Attends Religious Services: Patient refused  . Active Member of Clubs or Organizations: Patient refused  . Attends Banker Meetings: Patient refused  . Marital Status: Patient refused  Intimate Partner Violence: Unknown  . Fear of Current or Ex-Partner: Patient refused  . Emotionally Abused: Patient refused  . Physically Abused: Patient refused  . Sexually Abused: Patient refused   Family Status  Relation Name Status  . Mother  Deceased at age 74       Cause of Death:  Complications of SLE  . Father  Deceased at age 81       Cause of Death: MI  . Sister  Alive  . Sister  Alive  . Sister  Alive  . Neg Hx  (Not Specified)   Family History  Problem Relation Age of Onset  . Lupus Mother   . Heart attack Father   . Diabetes Father   . Diabetes Sister   . Diabetes Sister   . Diabetes Sister   . Breast cancer Neg Hx    Allergies  Allergen Reactions  . Bactrim [Sulfamethoxazole-Trimethoprim] Itching  . Daypro  [Oxaprozin]   . Sulfa Antibiotics Diarrhea  . Sulfasalazine     Patient Care Team: Malva Limes, MD as PCP - General (Family Medicine) Patty, A. Azucena Kuba, MD as Consulting Physician (Ophthalmology) Juanell Fairly, MD as Referring Physician (Orthopedic Surgery) Elwin Mocha, MD as Consulting Physician (Ophthalmology)   Medications: Outpatient Medications Prior to Visit  Medication Sig Note  . acetaminophen  Take 650 mg by mouth every 4 (four) hours as needed.   Marland Kitchen alendronate TAKE 1 TABLET BY MOUTH EVERY WEEK WITH A FULL GLASS OF WATER ON AN EMPTY STOMACH   . ALPRAZolam TAKE 1 TO 2 TABLETS BY MOUTH EVERY 4 HOURS AS NEEDED FOR ANXIETY. DO NOT EXCEED 4 TABLETS DAILY   . vitamin C Take 100 mg by mouth daily.   . Calcium Carb-Cholecalciferol Take 600 Units by mouth daily.   . cholecalciferol Take 1 tablet by mouth daily.   Marland Kitchen esomeprazole TAKE 1 CAPSULE BY MOUTH EVERY DAY AT NOON   . ezetimibe Take 1 tablet (10 mg total) by mouth daily.   Marland Kitchen FLUoxetine TAKE 1 CAPSULE BY MOUTH EVERY DAY   . hydrochlorothiazide Take 1 tablet (12.5 mg total) by mouth daily.   Marland Kitchen lisinopril Take 1 tablet (10 mg total) by mouth daily.   . Misc Natural Products (JOINT SUPPORT PO) Take by mouth.   . montelukast TAKE 1 TABLET BY MOUTH EVERY DAY FOR ALLERGIES   . Multiple Vitamins-Minerals (MULTIVITAMIN ADULT PO) Take 1 tablet by mouth daily.   . Multiple Vitamins-Minerals (PRESERVISION AREDS 2 PO) Take 1 tablet by mouth 2 (two) times daily.   . Fish Oil  Take 1 capsule by mouth daily.    . phentermine TAKE 1 CAPSULE(15 MG) BY MOUTH EVERY MORNING   . topiramate 1/2 tablet every morning for 14 days, then increase to 1 tablet every morning for 14 days, then 2 tablets every morning   . triamcinolone cream Apply 1 application topically daily as needed.   . TURMERIC PO Take by mouth.   . vitamin E Take by mouth daily. 1000 mg   . clorazepate TAKE 1/2 TABLET BY MOUTH THREE TIMES DAILY AS NEEDED (Patient not taking: Reported on 02/23/2020) 02/23/2020: PRN  . cyclobenzaprine TAKE 1 TABLET BY MOUTH THREE TIMES A DAY AS NEEDED FOR MUSCLE SPASMS (Patient not taking: Reported on 10/02/2019)    No facility-administered medications prior to visit.    Review of Systems  Constitutional: Negative for chills, fatigue and fever.  HENT: Negative for congestion, ear pain, rhinorrhea, sneezing and sore throat.   Eyes: Negative.  Negative for pain and redness.  Respiratory: Negative for cough, shortness of breath and wheezing.   Cardiovascular: Negative for chest pain and leg swelling.  Gastrointestinal: Negative for abdominal pain, blood in stool, constipation, diarrhea and nausea.  Endocrine: Negative for polydipsia and polyphagia.  Genitourinary: Negative.  Negative for dysuria, flank pain, hematuria, pelvic pain, vaginal bleeding and vaginal discharge.  Musculoskeletal: Negative for arthralgias, back pain, gait problem and joint swelling.  Skin: Negative for rash.  Allergic/Immunologic: Positive for environmental allergies.  Neurological: Negative.  Negative for dizziness, tremors, seizures, weakness, light-headedness, numbness and headaches.  Hematological: Negative for adenopathy.  Psychiatric/Behavioral: Negative.  Negative for behavioral problems, confusion and dysphoric mood. The patient is not nervous/anxious and is not hyperactive.         Objective:    BP 124/82   Pulse 84   Temp (!) 96.6 F (35.9 C) (Oral)   Resp 16   Ht 5\' 3"  (1.6 m)   Wt  180 lb (81.6 kg)   LMP 11/27/1980   BMI 31.89 kg/m    Physical Exam   General Appearance:    Obese female. Alert, cooperative, in no acute distress, appears stated age   Head:    Normocephalic, without obvious abnormality, atraumatic  Eyes:    PERRL, conjunctiva/corneas clear, EOM's intact, fundi  benign, both eyes  Ears:    Normal TM's and external ear canals, both ears  Nose:   Nares normal, septum midline, mucosa normal, no drainage    or sinus tenderness  Throat:   Lips, mucosa, and tongue normal; teeth and gums normal  Neck:   Supple, symmetrical, trachea midline, no adenopathy;    thyroid:  no enlargement/tenderness/nodules; no carotid   bruit or JVD  Back:     Symmetric, no curvature, ROM normal, no CVA tenderness  Lungs:     Clear to auscultation bilaterally, respirations unlabored  Chest Wall:    No tenderness or deformity   Heart:    Normal heart rate. Normal rhythm. No murmurs, rubs, or gallops.   Breast Exam:    normal appearance, no masses or tenderness  Abdomen:     Soft, non-tender, bowel sounds active all four quadrants,    no masses, no organomegaly  Pelvic:    deferred  Extremities:   All extremities are intact. No cyanosis or edema  Pulses:   2+ and symmetric all extremities  Skin:   Skin color, texture, turgor normal, no rashes or lesions  Lymph nodes:   Cervical, supraclavicular, and axillary nodes normal  Neurologic:   CNII-XII intact, normal strength, sensation and reflexes    throughout     Depression Screen  PHQ 2/9 Scores 10/02/2019 09/30/2018 09/28/2017  PHQ - 2 Score 0 0 0  PHQ- 9 Score - - -    No results found for any visits on 02/23/20.    Assessment & Plan:    Routine Health Maintenance and Physical Exam  Exercise Activities and Dietary recommendations Goals    . DIET - INCREASE WATER INTAKE     Recommend 6-8 glasses of water per day    . Have 3 meals a day     Recommend eating 3 meals a day with 2 healthy snacks in between.          Immunization History  Administered Date(s) Administered  . Influenza, High Dose Seasonal PF 09/02/2016, 09/28/2017, 09/30/2018, 08/22/2019  . PFIZER SARS-COV-2 Vaccination 01/09/2020, 02/02/2020  . Pneumococcal Conjugate-13 07/30/2014  . Pneumococcal Polysaccharide-23 10/22/1998, 03/14/2016  . Td 07/23/1995  . Tdap 09/29/2011  . Zoster 07/30/2014  . Zoster Recombinat (Shingrix) 08/22/2019, 10/30/2019    Health Maintenance  Topic Date Due  . Fecal DNA (Cologuard)  04/30/2020  . TETANUS/TDAP  09/28/2021  . DEXA SCAN  10/29/2021  . MAMMOGRAM  01/04/2022  . INFLUENZA VACCINE  Completed  . Hepatitis C Screening  Completed  . PNA vac Low Risk Adult  Completed    Discussed health benefits of physical activity, and encouraged her to engage in regular exercise appropriate for her age and condition.    2. Anxiety state Doing well with prn alprazolam.   3. Osteoporosis, unspecified osteoporosis type, unspecified pathological fracture presence Doing well with alendronate  4. Hyperlipidemia, unspecified hyperlipidemia type Doing well with ezetimibe, intolerant to statins.  - Comprehensive metabolic panel - Lipid panel - CBC  5. Prediabetes  - Hemoglobin A1c  6. Essential hypertension Well controlled.  Continue current medications.    7. Depressive disorder Doing well with fluoxetine with no adverse effects Continue current medications.        [name], MD  [facility] [phone] (phone) [phone] (fax)  [facility]

## 2020-02-26 DIAGNOSIS — E785 Hyperlipidemia, unspecified: Secondary | ICD-10-CM | POA: Diagnosis not present

## 2020-02-26 DIAGNOSIS — R7303 Prediabetes: Secondary | ICD-10-CM | POA: Diagnosis not present

## 2020-02-27 LAB — CBC
Hematocrit: 43.2 % (ref 34.0–46.6)
Hemoglobin: 14.6 g/dL (ref 11.1–15.9)
MCH: 32.8 pg (ref 26.6–33.0)
MCHC: 33.8 g/dL (ref 31.5–35.7)
MCV: 97 fL (ref 79–97)
Platelets: 249 10*3/uL (ref 150–450)
RBC: 4.45 x10E6/uL (ref 3.77–5.28)
RDW: 12 % (ref 11.7–15.4)
WBC: 6 10*3/uL (ref 3.4–10.8)

## 2020-02-27 LAB — LIPID PANEL
Chol/HDL Ratio: 3.9 ratio (ref 0.0–4.4)
Cholesterol, Total: 231 mg/dL — ABNORMAL HIGH (ref 100–199)
HDL: 59 mg/dL (ref >39)
LDL Chol Calc (NIH): 132 mg/dL — ABNORMAL HIGH (ref 0–99)
Triglycerides: 226 mg/dL — ABNORMAL HIGH (ref 0–149)
VLDL Cholesterol Cal: 40 mg/dL (ref 5–40)

## 2020-02-27 LAB — COMPREHENSIVE METABOLIC PANEL
ALT: 24 IU/L (ref 0–32)
AST: 20 IU/L (ref 0–40)
Albumin/Globulin Ratio: 1.6 (ref 1.2–2.2)
Albumin: 4.2 g/dL (ref 3.8–4.8)
Alkaline Phosphatase: 82 IU/L (ref 39–117)
BUN/Creatinine Ratio: 18 (ref 12–28)
BUN: 13 mg/dL (ref 8–27)
Bilirubin Total: 0.3 mg/dL (ref 0.0–1.2)
CO2: 27 mmol/L (ref 20–29)
Calcium: 9.3 mg/dL (ref 8.7–10.3)
Chloride: 100 mmol/L (ref 96–106)
Creatinine, Ser: 0.74 mg/dL (ref 0.57–1.00)
GFR calc Af Amer: 95 mL/min/{1.73_m2} (ref >59)
GFR calc non Af Amer: 82 mL/min/{1.73_m2} (ref >59)
Globulin, Total: 2.7 g/dL (ref 1.5–4.5)
Glucose: 129 mg/dL — ABNORMAL HIGH (ref 65–99)
Potassium: 3.6 mmol/L (ref 3.5–5.2)
Sodium: 139 mmol/L (ref 134–144)
Total Protein: 6.9 g/dL (ref 6.0–8.5)

## 2020-02-27 LAB — HEMOGLOBIN A1C
Est. average glucose Bld gHb Est-mCnc: 126 mg/dL
Hgb A1c MFr Bld: 6 % — ABNORMAL HIGH (ref 4.8–5.6)

## 2020-03-04 ENCOUNTER — Other Ambulatory Visit: Payer: Self-pay | Admitting: Family Medicine

## 2020-03-04 DIAGNOSIS — E669 Obesity, unspecified: Secondary | ICD-10-CM

## 2020-03-04 NOTE — Telephone Encounter (Signed)
Requested medication (s) are due for refill today: yes  Requested medication (s) are on the active medication list: yes  Last refill: 08/14/2019  Future visit scheduled: yes  Notes to clinic:  This refill cannot be delegated   Requested Prescriptions  Pending Prescriptions Disp Refills   topiramate (TOPAMAX) 50 MG tablet [Pharmacy Med Name: TOPIRAMATE 50MG  TABLETS] 60 tablet 2    Sig: TAKE 1/2 TABLET EVERY MORNING FOR 14 DAYS, THEN INCREASE TO 1 TABLET EVERY MORNING FOR 14 DAYS, THEN 2 TABLETS EVERY MORNING      Not Delegated - Neurology: Anticonvulsants - topiramate & zonisamide Failed - 03/04/2020 10:39 AM      Failed - This refill cannot be delegated      Passed - Cr in normal range and within 360 days    Creatinine, Ser  Date Value Ref Range Status  02/26/2020 0.74 0.57 - 1.00 mg/dL Final          Passed - CO2 in normal range and within 360 days    CO2  Date Value Ref Range Status  02/26/2020 27 20 - 29 mmol/L Final          Passed - Valid encounter within last 12 months    Recent Outpatient Visits           1 week ago Annual physical exam   [facility], MD   9 months ago Prediabetes   [facility], Sherrie Mustache, MD   1 year ago Annual physical exam   [facility], MD   1 year ago Back strain, initial encounter   [facility], MD   1 year ago Myalgia   [facility] Fisher, Good Sam, MD       Future Appointments             In 5 months Fisher, Demetrios Isaacs, MD [facility], [facility]

## 2020-03-15 ENCOUNTER — Other Ambulatory Visit: Payer: Self-pay | Admitting: Family Medicine

## 2020-03-15 NOTE — Telephone Encounter (Signed)
Dr Sherrie Mustache, The patient last got this in 2019.  However, she states she has been using it when her joints bother her.  She is out and said you and she talked about it during her last visit.

## 2020-03-15 NOTE — Telephone Encounter (Signed)
Patient advised.

## 2020-03-15 NOTE — Telephone Encounter (Signed)
Voltaren gel is OTC now.

## 2020-03-15 NOTE — Telephone Encounter (Signed)
Medication Refill - Medication: diclofenac sodium (VOLTAREN) 1 % GEL    Has the patient contacted their pharmacy? Yes.   (Agent: If no, request that the patient contact the pharmacy for the refill.) (Agent: If yes, when and what did the pharmacy advise?)  Preferred Pharmacy (with phone number or street name):  [id number] [place], [place] - [address] AT [facility] OF [address]  [address] [place] [address]  Phone: [phone] Fax: [phone]      Agent: Please be advised that RX refills may take up to 3 business days. We ask that you follow-up with your pharmacy.

## 2020-03-15 NOTE — Telephone Encounter (Signed)
Patient is requesting medication Rx for medication not on list- sent for review of request

## 2020-03-19 ENCOUNTER — Other Ambulatory Visit: Payer: Self-pay | Admitting: Family Medicine

## 2020-03-30 ENCOUNTER — Other Ambulatory Visit: Payer: Self-pay | Admitting: Family Medicine

## 2020-03-30 DIAGNOSIS — Z1211 Encounter for screening for malignant neoplasm of colon: Secondary | ICD-10-CM

## 2020-04-08 ENCOUNTER — Other Ambulatory Visit: Payer: Self-pay | Admitting: Family Medicine

## 2020-04-08 DIAGNOSIS — Z6833 Body mass index (BMI) 33.0-33.9, adult: Secondary | ICD-10-CM

## 2020-04-08 MED ORDER — TOPIRAMATE 100 MG PO TABS: 100.0000 mg | ORAL_TABLET | Freq: Every day | ORAL | 3 refills | Status: DC

## 2020-04-08 NOTE — Telephone Encounter (Signed)
[facility] faxed refill request for the following medications:  topiramate (TOPAMAX)  tablet [[id number]] DISCONTINUED  Please advise.  Thanks, [employer]

## 2020-04-13 ENCOUNTER — Other Ambulatory Visit: Payer: Self-pay | Admitting: Family Medicine

## 2020-05-01 ENCOUNTER — Other Ambulatory Visit: Payer: Self-pay | Admitting: Family Medicine

## 2020-05-01 DIAGNOSIS — F411 Generalized anxiety disorder: Secondary | ICD-10-CM

## 2020-05-01 NOTE — Telephone Encounter (Signed)
LOV 02/23/20. Approved per protocol.

## 2020-05-10 ENCOUNTER — Other Ambulatory Visit: Payer: Self-pay | Admitting: Family Medicine

## 2020-05-10 DIAGNOSIS — E785 Hyperlipidemia, unspecified: Secondary | ICD-10-CM

## 2020-05-11 DIAGNOSIS — Z1211 Encounter for screening for malignant neoplasm of colon: Secondary | ICD-10-CM | POA: Diagnosis not present

## 2020-05-14 LAB — COLOGUARD
COLOGUARD: NEGATIVE
Cologuard: NEGATIVE

## 2020-05-14 LAB — EXTERNAL GENERIC LAB PROCEDURE: COLOGUARD: NEGATIVE

## 2020-05-17 ENCOUNTER — Telehealth: Payer: Self-pay

## 2020-05-17 NOTE — Telephone Encounter (Signed)
Tried calling patient to advise her that her recent cologuard test done on 05/11/2020 is negative. Left message to call back. OK for [facility] to advise of results.

## 2020-05-18 NOTE — Telephone Encounter (Signed)
Patient returned call- notified of negative colo guard result

## 2020-05-27 ENCOUNTER — Other Ambulatory Visit: Payer: Self-pay | Admitting: Family Medicine

## 2020-05-27 NOTE — Telephone Encounter (Signed)
Requested medication (s) are due for refill today: no  Requested medication (s) are on the active medication list: yes  Last refill:  05/06/2020  Future visit scheduled: yes  Notes to clinic: this refill cannot be delegated    Requested Prescriptions  Pending Prescriptions Disp Refills   clorazepate (TRANXENE) 15 MG tablet [Pharmacy Med Name: CLORAZEPATE 15MG  TABLETS] 30 tablet     Sig: TAKE 1/2 TABLET BY MOUTH THREE TIMES DAILY AS NEEDED      Not Delegated - Psychiatry:  Anxiolytics/Hypnotics Failed - 05/27/2020 10:27 AM      Failed - This refill cannot be delegated      Failed - Urine Drug Screen completed in last 360 days.      Passed - Valid encounter within last 6 months    Recent Outpatient Visits           3 months ago Annual physical exam   [facility], MD   1 year ago Prediabetes   [facility], MD   1 year ago Annual physical exam   [facility], MD   1 year ago Back strain, initial encounter   [facility], MD   1 year ago Myalgia   [facility], MD       Future Appointments             In 2 months Fisher, Malva Limes, MD [facility], [facility]

## 2020-05-30 ENCOUNTER — Other Ambulatory Visit: Payer: Self-pay | Admitting: Family Medicine

## 2020-05-30 DIAGNOSIS — E669 Obesity, unspecified: Secondary | ICD-10-CM

## 2020-05-30 NOTE — Telephone Encounter (Signed)
Requested medication (s) are due for refill today: no  Requested medication (s) are on the active medication list: no  Last refill:  04/08/20  Future visit scheduled: yes  Notes to clinic: this refill not delegated to NT to refill or refuse   Requested Prescriptions  Pending Prescriptions Disp Refills   topiramate (TOPAMAX) 50 MG tablet [Pharmacy Med Name: TOPIRAMATE 50MG  TABLETS] 114 tablet     Sig: TAKE 1/2 TABLET BY MOUTH EVERY MORNING FOR 14 DAYS, INCREASE TO 1 TABLET EVERY MORNING FOR 14 DAYS, THEN 2 TABLET EVERY MORNING      Not Delegated - Neurology: Anticonvulsants - topiramate & zonisamide Failed - 05/30/2020 12:23 PM      Failed - This refill cannot be delegated      Passed - Cr in normal range and within 360 days    Creatinine, Ser  Date Value Ref Range Status  02/26/2020 0.74 0.57 - 1.00 mg/dL Final          Passed - CO2 in normal range and within 360 days    CO2  Date Value Ref Range Status  02/26/2020 27 20 - 29 mmol/L Final          Passed - Valid encounter within last 12 months    Recent Outpatient Visits           3 months ago Annual physical exam   [facility], MD   1 year ago Prediabetes   [facility], MD   1 year ago Annual physical exam   [facility], MD   1 year ago Back strain, initial encounter   [facility], MD   1 year ago Myalgia   [facility] Fisher, Good Sam, MD       Future Appointments             In 2 months Fisher, Demetrios Isaacs, MD [facility], [facility]

## 2020-06-07 ENCOUNTER — Other Ambulatory Visit: Payer: Self-pay | Admitting: Family Medicine

## 2020-06-09 ENCOUNTER — Other Ambulatory Visit: Payer: Self-pay | Admitting: Family Medicine

## 2020-06-09 NOTE — Telephone Encounter (Signed)
Requested Prescriptions  Pending Prescriptions Disp Refills   lisinopril (ZESTRIL) 10 MG tablet [Pharmacy Med Name: LISINOPRIL  10MG   TAB] 90 tablet 0    Sig: TAKE 1 TABLET BY MOUTH  DAILY     Cardiovascular:  ACE Inhibitors Passed - 06/09/2020  6:55 AM      Passed - Cr in normal range and within 180 days    Creatinine, Ser  Date Value Ref Range Status  02/26/2020 0.74 0.57 - 1.00 mg/dL Final         Passed - K in normal range and within 180 days    Potassium  Date Value Ref Range Status  02/26/2020 3.6 3.5 - 5.2 mmol/L Final         Passed - Patient is not pregnant      Passed - Last BP in normal range    BP Readings from Last 1 Encounters:  02/23/20 124/82         Passed - Valid encounter within last 6 months    Recent Outpatient Visits          3 months ago Annual physical exam   [facility], MD   1 year ago Prediabetes   [facility], MD   1 year ago Annual physical exam   [facility], MD   1 year ago Back strain, initial encounter   [facility], MD   1 year ago Myalgia   [facility], MD      Future Appointments            In 1 month Fisher, Malva Limes, MD [facility], [facility]

## 2020-06-28 ENCOUNTER — Other Ambulatory Visit: Payer: Self-pay | Admitting: Family Medicine

## 2020-06-28 DIAGNOSIS — E669 Obesity, unspecified: Secondary | ICD-10-CM

## 2020-06-28 NOTE — Telephone Encounter (Signed)
Requested medication (s) are due for refill today: yes  Requested medication (s) are on the active medication list: yes  Last refill:  05/27/2020  Future visit scheduled: yes  Notes to clinic:  this refill cannot be delegated    Requested Prescriptions  Pending Prescriptions Disp Refills   phentermine 15 MG capsule [Pharmacy Med Name: PHENTERMINE HCL 15MG  CAPSULES] 30 capsule     Sig: TAKE 1 CAPSULE(15 MG) BY MOUTH EVERY MORNING      Not Delegated - Gastroenterology:  Antiobesity Agents Failed - 06/28/2020  9:27 AM      Failed - This refill cannot be delegated      Passed - Last BP in normal range    BP Readings from Last 1 Encounters:  02/23/20 124/82          Passed - Last Heart Rate in normal range    Pulse Readings from Last 1 Encounters:  02/23/20 84          Passed - Valid encounter within last 12 months    Recent Outpatient Visits           4 months ago Annual physical exam   [facility], MD   1 year ago Prediabetes   [facility], MD   1 year ago Annual physical exam   [facility], MD   1 year ago Back strain, initial encounter   [facility], MD   1 year ago Myalgia   [facility], MD       Future Appointments             In 1 month Fisher, Malva Limes, MD [facility], [facility]

## 2020-07-31 ENCOUNTER — Other Ambulatory Visit: Payer: Self-pay | Admitting: Family Medicine

## 2020-07-31 NOTE — Telephone Encounter (Signed)
Requested  medications are  due for refill today yes  Requested medications are on the active medication list yes  Last refill 07/17/20  Last visit 01/2020  Future visit scheduled Next week, 08/03/20  Notes to clinic Not Delegated.

## 2020-08-03 ENCOUNTER — Other Ambulatory Visit: Payer: Self-pay

## 2020-08-03 ENCOUNTER — Ambulatory Visit (INDEPENDENT_AMBULATORY_CARE_PROVIDER_SITE_OTHER): Payer: Medicare Other | Admitting: Family Medicine

## 2020-08-03 ENCOUNTER — Ambulatory Visit: Payer: Medicare Other | Admitting: Family Medicine

## 2020-08-03 ENCOUNTER — Encounter: Payer: Self-pay | Admitting: Family Medicine

## 2020-08-03 VITALS — BP 119/76 | HR 82 | Temp 98.0°F | Resp 16 | Wt 168.0 lb

## 2020-08-03 DIAGNOSIS — F329 Major depressive disorder, single episode, unspecified: Secondary | ICD-10-CM

## 2020-08-03 DIAGNOSIS — Z6833 Body mass index (BMI) 33.0-33.9, adult: Secondary | ICD-10-CM

## 2020-08-03 DIAGNOSIS — F439 Reaction to severe stress, unspecified: Secondary | ICD-10-CM | POA: Diagnosis not present

## 2020-08-03 DIAGNOSIS — Z23 Encounter for immunization: Secondary | ICD-10-CM

## 2020-08-03 DIAGNOSIS — H3530 Unspecified macular degeneration: Secondary | ICD-10-CM | POA: Insufficient documentation

## 2020-08-03 DIAGNOSIS — F32A Depression, unspecified: Secondary | ICD-10-CM

## 2020-08-03 DIAGNOSIS — E669 Obesity, unspecified: Secondary | ICD-10-CM | POA: Diagnosis not present

## 2020-08-03 DIAGNOSIS — R7303 Prediabetes: Secondary | ICD-10-CM | POA: Diagnosis not present

## 2020-08-03 LAB — POCT GLYCOSYLATED HEMOGLOBIN (HGB A1C)
Est. average glucose Bld gHb Est-mCnc: 128
Hemoglobin A1C: 6.1 % — AB (ref 4.0–5.6)

## 2020-08-03 MED ORDER — PHENTERMINE HCL 15 MG PO CAPS: ORAL_CAPSULE | ORAL | 3 refills | Status: DC

## 2020-08-03 MED ORDER — TRIAMCINOLONE ACETONIDE 0.5 % EX CREA: 1.0000 "application " | TOPICAL_CREAM | Freq: Every day | CUTANEOUS | 3 refills | Status: DC | PRN

## 2020-08-03 MED ORDER — TOPIRAMATE 50 MG PO TABS: ORAL_TABLET | ORAL | Status: DC

## 2020-08-03 NOTE — Progress Notes (Signed)
I,Gabrielle Salinas,acting as a scribe for Gabrielle Merry, MD.,have documented all relevant documentation on the behalf of Gabrielle Merry, MD,as directed by  Gabrielle Merry, MD while in the presence of Gabrielle Merry, MD.   Established patient visit   Patient: Gabrielle Salinas   DOB: [birth year]   69 y.o. Female  MRN: [id number] Visit Date: 08/03/2020  Today's healthcare provider: [name], MD   Chief Complaint  Patient presents with  . Hyperlipidemia  . Hyperglycemia   Subjective    HPI  Lipid/Cholesterol, Follow-up  Last lipid panel Other pertinent labs  Lab Results  Component Value Date   CHOL 231 (H) 02/26/2020   HDL 59 02/26/2020   LDLCALC 132 (H) 02/26/2020   LDLDIRECT 136 (H) 05/21/2019   TRIG 226 (H) 02/26/2020   CHOLHDL 3.9 02/26/2020   Lab Results  Component Value Date   ALT 24 02/26/2020   AST 20 02/26/2020   PLT 249 02/26/2020   TSH 0.976 07/03/2018     She was last seen for this 5 months ago.  Management since that visit includes advising patient to work on diet and exercise.  She reports good compliance with treatment. She is not having side effects.   Symptoms: No chest pain No chest pressure/discomfort  No dyspnea No lower extremity edema  No numbness or tingling of extremity No orthopnea  No palpitations No paroxysmal nocturnal dyspnea  No speech difficulty No syncope   Current diet: well balanced Current exercise: walking  The 10-year ASCVD risk score Denman George DC Jr., et al., 2013) is: 12.5%  ---------------------------------------------------------------------------------------------------  Prediabetes, Follow-up  Lab Results  Component Value Date   HGBA1C 6.1 (A) 08/03/2020   HGBA1C 6.0 (H) 02/26/2020   HGBA1C 6.1 (A) 05/19/2019   GLUCOSE 129 (H) 02/26/2020   GLUCOSE 104 (H) 11/08/2018   GLUCOSE 113 (H) 07/03/2018    Last seen for for this 5 months ago.  Management since that visit includes advising patient to work on  cutting out sweets and starches and try to get 30 minutes of walking for exercise as much as possible. Follow up to check A1c in 4-5 months.   Current symptoms include none and have been stable.  Prior visit with dietician: no Current diet: well balanced Current exercise: walking  Pertinent Labs:    Component Value Date/Time   CHOL 231 (H) 02/26/2020 1106   TRIG 226 (H) 02/26/2020 1106   CHOLHDL 3.9 02/26/2020 1106   CREATININE 0.74 02/26/2020 1106    Wt Readings from Last 3 Encounters:  08/03/20 168 lb (76.2 kg)  02/23/20 180 lb (81.6 kg)  05/19/19 184 lb (83.5 kg)    -----------------------------------------------------------------------------------------  Hypertension, follow-up  BP Readings from Last 3 Encounters:  08/03/20 119/76  02/23/20 124/82  05/19/19 120/78   Wt Readings from Last 3 Encounters:  08/03/20 168 lb (76.2 kg)  02/23/20 180 lb (81.6 kg)  05/19/19 184 lb (83.5 kg)     She was last seen for hypertension 5 months ago.  BP at that visit was 124/82. Management since that visit includes continue same medication.  She reports good compliance with treatment. She is not having side effects.  She is following a Regular diet. She is exercising. She does not smoke.  Use of agents associated with hypertension: none.   Outside blood pressures are checked occasionally. Symptoms: No chest pain No chest pressure  No palpitations No syncope  No dyspnea No orthopnea  No paroxysmal nocturnal dyspnea No lower extremity edema  Pertinent labs: Lab Results  Component Value Date   CHOL 231 (H) 02/26/2020   HDL 59 02/26/2020   LDLCALC 132 (H) 02/26/2020   LDLDIRECT 136 (H) 05/21/2019   TRIG 226 (H) 02/26/2020   CHOLHDL 3.9 02/26/2020   Lab Results  Component Value Date   NA 139 02/26/2020   K 3.6 02/26/2020   CREATININE 0.74 02/26/2020   GFRNONAA 82 02/26/2020   GFRAA 95 02/26/2020   GLUCOSE 129 (H) 02/26/2020     The 10-year ASCVD risk score  Denman George DC Jr., et al., 2013) is: 12.5%   --------------------------------------------------------------------------------------------------- She reports that she has been under a lot of stress regarding her 51 year old grand son and what she feels is inappropriate parenting by her daughter. She is concerned about her daughters alcohol consumption and relationship between her grandson's parents. Her grandson is apparently falling behind in school, not able to read at grade level which she is very stressed about and doesn't know how to deal with daughter about the situation. She does take occasional alprazolam due to the stress of the situation.   She also requests a refill for phentermine which she feels works best if she takes in intermittently. This was prescribed in addition to topiramate, which she states the pharmacy automatically refilled and that she has plenty of for the time being.      Medications: Outpatient Medications Prior to Visit  Medication Sig  . acetaminophen (TYLENOL) 325 MG tablet Take 650 mg by mouth every 4 (four) hours as needed.  Marland Kitchen alendronate (FOSAMAX) 70 MG tablet TAKE 1 TABLET BY MOUTH EVERY WEEK WITH A FULL GLASS OF WATER ON AN EMPTY STOMACH  . ALPRAZolam (XANAX) 0.25 MG tablet TAKE 1 TO 2 TABLETS BY MOUTH EVERY 4 HOURS AS NEEDED FOR ANXIETY. DO NOT EXCEED 4 TABLETS DAILY  . Ascorbic Acid (VITAMIN C) 100 MG tablet Take 100 mg by mouth daily.  . Calcium Carb-Cholecalciferol (CALCIUM PLUS VITAMIN D3) 600-500 MG-UNIT CAPS Take 600 Units by mouth daily.  . cholecalciferol (VITAMIN D) 1000 units tablet Take 1 tablet by mouth daily.  . clorazepate (TRANXENE) 15 MG tablet TAKE 1/2 TABLET BY MOUTH THREE TIMES DAILY AS NEEDED  . esomeprazole (NEXIUM) 40 MG capsule TAKE 1 CAPSULE BY MOUTH EVERY DAY AT NOON  . ezetimibe (ZETIA) 10 MG tablet TAKE 1 TABLET(10 MG) BY MOUTH DAILY  . FLUoxetine (PROZAC) 20 MG capsule TAKE 1 CAPSULE BY MOUTH EVERY DAY  . hydrochlorothiazide  (HYDRODIURIL) 12.5 MG tablet TAKE 1 TABLET(12.5 MG) BY MOUTH DAILY  . lisinopril (ZESTRIL) 10 MG tablet TAKE 1 TABLET BY MOUTH  DAILY  . Misc Natural Products (JOINT SUPPORT PO) Take by mouth.  . montelukast (SINGULAIR) 10 MG tablet TAKE 1 TABLET BY MOUTH EVERY DAY FOR ALLERGIES  . Multiple Vitamins-Minerals (MULTIVITAMIN ADULT PO) Take 1 tablet by mouth daily.  . Multiple Vitamins-Minerals (PRESERVISION AREDS 2 PO) Take 1 tablet by mouth 2 (two) times daily.  . Omega-3 Fatty Acids (FISH OIL) 1000 MG CAPS Take 1 capsule by mouth daily.   Marland Kitchen triamcinolone cream (KENALOG) 0.5 % Apply 1 application topically daily as needed.  . TURMERIC PO Take by mouth.  . vitamin E 1000 UNIT capsule Take by mouth daily. 1000 mg  . [DISCONTINUED] topiramate (TOPAMAX) 50 MG tablet Take 2 tablets (100 mg total) by mouth daily. TAKE 1/2 TABLET BY MOUTH EVERY MORNING FOR 14 DAYS, INCREASE TO 1 TABLET EVERY MORNING FOR 14 DAYS, THEN 2 TABLET EVERY MORNING  . [  DISCONTINUED] cyclobenzaprine (FLEXERIL) 5 MG tablet TAKE 1 TABLET BY MOUTH THREE TIMES A DAY AS NEEDED FOR MUSCLE SPASMS (Patient not taking: Reported on 10/02/2019)  . [DISCONTINUED] phentermine 15 MG capsule TAKE 1 CAPSULE(15 MG) BY MOUTH EVERY MORNING (Patient not taking: Reported on 08/03/2020)   No facility-administered medications prior to visit.    Review of Systems  Constitutional: Negative for appetite change, chills, fatigue and fever.  Respiratory: Negative for chest tightness and shortness of breath.   Cardiovascular: Negative for chest pain and palpitations.  Gastrointestinal: Negative for abdominal pain, nausea and vomiting.  Neurological: Negative for dizziness and weakness.      Objective    BP 119/76 (BP Location: Left Arm, Patient Position: Sitting, Cuff Size: Large)   Pulse 82   Temp 98 F (36.7 C) (Oral)   Resp 16   Wt 168 lb (76.2 kg)   LMP 11/27/1980   BMI 29.76 kg/m    Physical Exam   General appearance:  Overweight female,  cooperative and in no acute distress Head: Normocephalic, without obvious abnormality, atraumatic Respiratory: Respirations even and unlabored, normal respiratory rate Extremities: All extremities are intact.  Skin: Skin color, texture, turgor normal. No rashes seen  Psych: Appropriate mood and affect. Tearful at tiems.  Neurologic: Mental status: Alert, oriented to person, place, and time, thought content appropriate.   Results for orders placed or performed in visit on 08/03/20  POCT HgB A1C  Result Value Ref Range   Hemoglobin A1C 6.1 (A) 4.0 - 5.6 %   Est. average glucose Bld gHb Est-mCnc 128     Assessment & Plan     1. Prediabetes Doing well with diet.  2. Class 1 obesity She is only taking topiramate and phentermine intermittently which she feels is maintaining her weight.  - topiramate (TOPAMAX) 50 MG tablet; TAKE 1/2 TABLET BY MOUTH EVERY MORNING FOR 4 DAYS, INCREASE TO 1 TABLET EVERY MORNING FOR 4 DAYS, THEN 2 TABLET EVERY MORNING  Dispense: 3 tablet - phentermine 15 MG capsule; TAKE 1 CAPSULE(15 MG) BY MOUTH EVERY MORNING  Dispense: 30 capsule; Refill: 3  3. Depressive disorder  - Ambulatory referral to Psychology  4. Situational stress  - Ambulatory referral to Psychology  5. Need for influenza vaccination  - Flu Vaccine QUAD High Dose IM (Fluad)   Future Appointments  Date Time Provider Department Center  11/05/2020 11:00 AM Sherrie Mustache Demetrios Isaacs, MD [facility]        The entirety of the information documented in the History of Present Illness, Review of Systems and Physical Exam were personally obtained by me. Portions of this information were initially documented by the CMA and reviewed by me for thoroughness and accuracy.      Gabrielle Merry, MD  [facility] [phone] (phone) [phone] (fax)  [facility]

## 2020-08-12 ENCOUNTER — Other Ambulatory Visit: Payer: Self-pay | Admitting: Family Medicine

## 2020-08-13 ENCOUNTER — Other Ambulatory Visit: Payer: Self-pay | Admitting: Family Medicine

## 2020-08-13 NOTE — Telephone Encounter (Signed)
Requested medication (s) are due for refill today - unknown- no if #30 is consider 1 month supply  Requested medication (s) are on the active medication list -yes  Future visit scheduled -yes  Last refill: 07/24/20  Notes to clinic: Request for non delegated Rx  Requested Prescriptions  Pending Prescriptions Disp Refills   clorazepate (TRANXENE) 15 MG tablet [Pharmacy Med Name: CLORAZEPATE 15MG  TABLETS] 30 tablet     Sig: TAKE 1/2 TABLET BY MOUTH THREE TIMES DAILY AS NEEDED      Not Delegated - Psychiatry:  Anxiolytics/Hypnotics Failed - 08/13/2020 10:44 AM      Failed - This refill cannot be delegated      Failed - Urine Drug Screen completed in last 360 days.      Passed - Valid encounter within last 6 months    Recent Outpatient Visits           1 week ago Prediabetes   [facility], Sherrie Mustache, MD   5 months ago Annual physical exam   [facility], MD   1 year ago Prediabetes   [facility], MD   1 year ago Annual physical exam   [facility], MD   1 year ago Back strain, initial encounter   [facility] Fisher, [facility], MD       Future Appointments             In 2 months Fisher, Demetrios Isaacs, MD [facility]                Requested Prescriptions  Pending Prescriptions Disp Refills   clorazepate (TRANXENE) 15 MG tablet [Pharmacy Med Name: CLORAZEPATE 15MG  TABLETS] 30 tablet     Sig: TAKE 1/2 TABLET BY MOUTH THREE TIMES DAILY AS NEEDED      Not Delegated - Psychiatry:  Anxiolytics/Hypnotics Failed - 08/13/2020 10:44 AM      Failed - This refill cannot be delegated      Failed - Urine Drug Screen completed in last 360 days.      Passed - Valid encounter within last 6 months    Recent Outpatient Visits           1 week ago Prediabetes   [facility] 08/15/2020, MD   5 months ago Annual  physical exam   [facility] Malva Limes, MD   1 year ago Prediabetes   [facility] Malva Limes, MD   1 year ago Annual physical exam   [facility] Malva Limes, MD   1 year ago Back strain, initial encounter   [facility] Fisher, Malva Limes, MD       Future Appointments             In 2 months Fisher, [facility], MD [facility], [facility]

## 2020-09-05 ENCOUNTER — Other Ambulatory Visit: Payer: Self-pay | Admitting: Family Medicine

## 2020-09-05 NOTE — Telephone Encounter (Signed)
Requested medication (s) are due for refill today: yes  Requested medication (s) are on the active medication list: yes  Last refill:  08/01/20  Future visit scheduled: yes  Notes to clinic:  med not delegated to NT to RF   Requested Prescriptions  Pending Prescriptions Disp Refills   ALPRAZolam (XANAX) 0.25 MG tablet [Pharmacy Med Name: ALPRAZOLAM 0.25MG  TABLETS] 30 tablet     Sig: TAKE 1 TO 2 TABLETS BY MOUTH EVERY 4 HOURS AS NEEDED FOR ANXIETY. DO NOT EXCEED 4 TABLETS DAILY      Not Delegated - Psychiatry:  Anxiolytics/Hypnotics Failed - 09/05/2020 10:30 AM      Failed - This refill cannot be delegated      Failed - Urine Drug Screen completed in last 360 days.      Passed - Valid encounter within last 6 months    Recent Outpatient Visits           1 month ago Prediabetes   [facility] Malva Limes, MD   6 months ago Annual physical exam   [facility] Malva Limes, MD   1 year ago Prediabetes   [facility] Malva Limes, MD   1 year ago Annual physical exam   [facility] Limes, MD   1 year ago Back strain, initial encounter   [facility] Fisher, Demetrios Isaacs, MD       Future Appointments             In 2 months Fisher, Demetrios Isaacs, MD [facility], [facility]

## 2020-10-12 ENCOUNTER — Other Ambulatory Visit: Payer: Self-pay | Admitting: Family Medicine

## 2020-10-13 ENCOUNTER — Other Ambulatory Visit: Payer: Self-pay | Admitting: Family Medicine

## 2020-10-19 ENCOUNTER — Other Ambulatory Visit: Payer: Self-pay | Admitting: Physician Assistant

## 2020-10-19 DIAGNOSIS — Z6833 Body mass index (BMI) 33.0-33.9, adult: Secondary | ICD-10-CM

## 2020-10-19 DIAGNOSIS — E669 Obesity, unspecified: Secondary | ICD-10-CM

## 2020-10-19 NOTE — Telephone Encounter (Signed)
Requested medication (s) are due for refill today: yes  Requested medication (s) are on the active medication list: {yes  Last refill: 08/03/20  #3  0 refills  Future visit scheduled yes   10/26/20  Notes to clinic: not delegated  Requested Prescriptions  Pending Prescriptions Disp Refills   topiramate (TOPAMAX) 50 MG tablet [Pharmacy Med Name: TOPIRAMATE 50MG  TABLETS] 180 tablet     Sig: TAKE 1/2 TABLET EVERY MORNING FOR 14 DAYS, THEN 1 TABLET EVERY MORNING FOR 14 DAYS, THEN 2 TABLETS EVERY MORNING      Not Delegated - Neurology: Anticonvulsants - topiramate & zonisamide Failed - 10/19/2020  3:44 AM      Failed - This refill cannot be delegated      Passed - Cr in normal range and within 360 days    Creatinine, Ser  Date Value Ref Range Status  02/26/2020 0.74 0.57 - 1.00 mg/dL Final          Passed - CO2 in normal range and within 360 days    CO2  Date Value Ref Range Status  02/26/2020 27 20 - 29 mmol/L Final          Passed - Valid encounter within last 12 months    Recent Outpatient Visits           2 months ago Prediabetes   [facility], MD   7 months ago Annual physical exam   [facility], MD   1 year ago Prediabetes   [facility], MD   1 year ago Annual physical exam   [facility], MD   2 years ago Back strain, initial encounter   [facility] Fisher, [facility], MD       Future Appointments             In 2 weeks Fisher, Demetrios Isaacs, MD [facility], [facility]

## 2020-10-25 NOTE — Progress Notes (Signed)
Subjective:   Gabrielle Salinas is a 69 y.o. female who presents for Medicare Annual (Subsequent) preventive examination.  I connected with Gabrielle Salinas today by telephone and verified that I am speaking with the correct person using two identifiers. Location patient: home Location provider: work Persons participating in the virtual visit: patient, provider.   I discussed the limitations, risks, security and privacy concerns of performing an evaluation and management service by telephone and the availability of in person appointments. I also discussed with the patient that there may be a patient responsible charge related to this service. The patient expressed understanding and verbally consented to this telephonic visit.    Interactive audio and video telecommunications were attempted between this provider and patient, however failed, due to patient having technical difficulties OR patient did not have access to video capability.  We continued and completed visit with audio only.   Review of Systems    N/A  Cardiac Risk Factors include: advanced age (>47men, >54 women)     Objective:    There were no vitals filed for this visit. There is no height or weight on file to calculate BMI.  Advanced Directives 10/26/2020 10/02/2019 09/30/2018 09/28/2017  Does Patient Have a Medical Advance Directive? Yes Yes No No  Type of Estate agent of [place];Living will Healthcare Power of [place];Living will - -  Copy of Healthcare Power of Attorney in Chart? No - copy requested No - copy requested - -  Would patient like information on creating a medical advance directive? - - Yes (MAU/Ambulatory/Procedural Areas - Information given) (No Data)    Current Medications (verified) Outpatient Encounter Medications as of 10/26/2020  Medication Sig  . acetaminophen (TYLENOL) 325 MG tablet Take 650 mg by mouth every 4 (four) hours as needed.  Marland Kitchen alendronate (FOSAMAX) 70 MG tablet  TAKE 1 TABLET BY MOUTH EVERY WEEK WITH A FULL GLASS OF WATER AND ON AN EMPTY STOMACH  . ALPRAZolam (XANAX) 0.25 MG tablet TAKE 1 TO 2 TABLETS BY MOUTH EVERY 4 HOURS AS NEEDED FOR ANXIETY. DO NOT EXCEED 4 TABLETS DAILY  . Ascorbic Acid (VITAMIN C) 100 MG tablet Take 100 mg by mouth daily.  . cholecalciferol (VITAMIN D) 1000 units tablet Take 1 tablet by mouth daily.  . clorazepate (TRANXENE) 15 MG tablet TAKE 1/2 TABLET BY MOUTH THREE TIMES DAILY AS NEEDED  . esomeprazole (NEXIUM) 40 MG capsule TAKE 1 CAPSULE BY MOUTH EVERY DAY AT NOON  . ezetimibe (ZETIA) 10 MG tablet TAKE 1 TABLET(10 MG) BY MOUTH DAILY  . FLUoxetine (PROZAC) 20 MG capsule TAKE 1 CAPSULE BY MOUTH EVERY DAY  . hydrochlorothiazide (HYDRODIURIL) 12.5 MG tablet TAKE 1 TABLET(12.5 MG) BY MOUTH DAILY  . lisinopril (ZESTRIL) 10 MG tablet TAKE 1 TABLET BY MOUTH  DAILY  . Misc Natural Products (JOINT SUPPORT PO) Take by mouth daily.   . montelukast (SINGULAIR) 10 MG tablet TAKE 1 TABLET BY MOUTH EVERY DAY FOR ALLERGIES  . Multiple Vitamins-Minerals (MULTIVITAMIN ADULT PO) Take 1 tablet by mouth daily.  . Multiple Vitamins-Minerals (PRESERVISION AREDS 2 PO) Take 1 tablet by mouth 2 (two) times daily.  . Omega-3 Fatty Acids (FISH OIL) 1000 MG CAPS Take 1 capsule by mouth daily.   . phentermine 15 MG capsule TAKE 1 CAPSULE(15 MG) BY MOUTH EVERY MORNING  . topiramate (TOPAMAX) 50 MG tablet Take 2 tablets (100 mg total) by mouth daily.  Marland Kitchen triamcinolone cream (KENALOG) 0.5 % Apply 1 application topically daily as needed.  Marland Kitchen  TURMERIC PO Take by mouth daily.   . vitamin E 1000 UNIT capsule Take 1,000 Units by mouth daily.   . Calcium Carb-Cholecalciferol (CALCIUM PLUS VITAMIN D3) 600-500 MG-UNIT CAPS Take 600 Units by mouth daily. (Patient not taking: Reported on 10/26/2020)   No facility-administered encounter medications on file as of 10/26/2020.    Allergies (verified) Bactrim [sulfamethoxazole-trimethoprim], Bee venom, Daypro   [oxaprozin], Sulfa antibiotics, and Sulfasalazine   History: Past Medical History:  Diagnosis Date  . Allergy   . Anxiety   . Cataract   . Depression   . GERD (gastroesophageal reflux disease)   . Hyperlipidemia   . Hypertension   . Osteoporosis    Past Surgical History:  Procedure Laterality Date  . BREAST BIOPSY Right <10 yrs ago   x2 benign results  . CATARACT EXTRACTION, BILATERAL  09/2019  . COLONOSCOPY  11/19/2017  . GI Bleed  2008  . RHINOPLASTY    . UPPER GASTROINTESTINAL ENDOSCOPY  10/2017   [facility]  . VAGINAL HYSTERECTOMY  1982   due to abnormal pap smears and menorrhagia   Family History  Problem Relation Age of Onset  . Lupus Mother   . Heart attack Father   . Diabetes Father   . Diabetes Sister   . Diabetes Sister   . Diabetes Sister   . Breast cancer Neg Hx    Social History   Socioeconomic History  . Marital status: Widowed    Spouse name: Not on file  . Number of children: 3  . Years of education: Not on file  . Highest education level: Associate degree: academic program  Occupational History  . Occupation: retired    Comment: keeps granddaughter  Tobacco Use  . Smoking status: Never Smoker  . Smokeless tobacco: Never Used  Vaping Use  . Vaping Use: Never used  Substance and Sexual Activity  . Alcohol use: Yes    Alcohol/week: 0.0 standard drinks    Comment: 1glass of wine on special occasions  . Drug use: Never  . Sexual activity: Not on file  Other Topics Concern  . Not on file  Social History Narrative  . Not on file   Social Determinants of Health   Financial Resource Strain: Medium Risk  . Difficulty of Paying Living Expenses: Somewhat hard  Food Insecurity: No Food Insecurity  . Worried About Programme researcher, broadcasting/film/video in the Last Year: Never true  . Ran Out of Food in the Last Year: Never true  Transportation Needs: No Transportation Needs  . Lack of Transportation (Medical): No  . Lack of Transportation (Non-Medical): No  Physical  Activity: Sufficiently Active  . Days of Exercise per Week: 5 days  . Minutes of Exercise per Session: 30 min  Stress: No Stress Concern Present  . Feeling of Stress : Only a little  Social Connections: Moderately Isolated  . Frequency of Communication with Friends and Family: More than three times a week  . Frequency of Social Gatherings with Friends and Family: More than three times a week  . Attends Religious Services: More than 4 times per year  . Active Member of Clubs or Organizations: No  . Attends Banker Meetings: Never  . Marital Status: Widowed    Tobacco Counseling Counseling given: Not Answered   Clinical Intake:  Pre-visit preparation completed: Yes  Pain : No/denies pain     Nutritional Risks: None Diabetes: No  How often do you need to have someone help you when you read  instructions, pamphlets, or other written materials from your doctor or pharmacy?: 1 - Never  Diabetic? No  Interpreter Needed?: No  Information entered by :: [facility], LPN   Activities of Daily Living In your present state of health, do you have any difficulty performing the following activities: 10/26/2020  Hearing? N  Vision? N  Difficulty concentrating or making decisions? N  Walking or climbing stairs? N  Dressing or bathing? N  Doing errands, shopping? N  Preparing Food and eating ? N  Using the Toilet? N  In the past six months, have you accidently leaked urine? N  Do you have problems with loss of bowel control? N  Managing your Medications? N  Managing your Finances? N  Housekeeping or managing your Housekeeping? N  Some recent data might be hidden    Patient Care Team: Malva LimesFisher, Donald E, MD as PCP - General (Family Medicine) Patty, A. Azucena Kubaeid, MD as Consulting Physician (Ophthalmology) Juanell FairlyKrasinski, Kevin, MD as Referring Physician (Orthopedic Surgery) Elwin MochaShah, Christopher Tulip, MD as Consulting Physician (Ophthalmology)  Indicate any recent Medical Services  you may have received from other than [facility] providers in the past year (date may be approximate).     Assessment:   This is a routine wellness examination for Gabrielle SagoSarah.  Hearing/Vision screen No exam data present  Dietary issues and exercise activities discussed: Current Exercise Habits: Home exercise routine, Type of exercise: walking;yoga, Time (Minutes): 45, Frequency (Times/Week): 5, Weekly Exercise (Minutes/Week): 225, Intensity: Mild  Goals    . DIET - INCREASE WATER INTAKE     Recommend 6-8 glasses of water per day      Depression Screen PHQ 2/9 Scores 10/26/2020 08/03/2020 10/02/2019 09/30/2018 09/28/2017 04/13/2017 03/14/2016  PHQ - 2 Score 0 2 0 0 0 0 1  PHQ- 9 Score - 6 - - - 3 7    Fall Risk Fall Risk  10/26/2020 10/02/2019 10/02/2019 09/30/2018 09/28/2017  Falls in the past year? 0 0 0 0 Yes  Number falls in past yr: 0 0 0 - 2 or more  Comment - - - - knee gave out  Injury with Fall? 0 0 0 - No  Follow up - - - - Falls prevention discussed    Any stairs in or around the home? Yes  If so, are there any without handrails? Yes  Home free of loose throw rugs in walkways, pet beds, electrical cords, etc? Yes  Adequate lighting in your home to reduce risk of falls? Yes   ASSISTIVE DEVICES UTILIZED TO PREVENT FALLS:  Life alert? No  Use of a cane, walker or w/c? No  Grab bars in the bathroom? Yes  Shower chair or bench in shower? No  Elevated toilet seat or a handicapped toilet? No    Cognitive Function: Declined today.     6CIT Screen 09/28/2017  What Year? 0 points  What month? 0 points  What time? 0 points  Count back from 20 0 points  Months in reverse 0 points  Repeat phrase 2 points  Total Score 2    Immunizations Immunization History  Administered Date(s) Administered  . Fluad Quad(high Dose 65+) 08/03/2020  . Influenza, High Dose Seasonal PF 09/02/2016, 09/28/2017, 09/30/2018, 08/22/2019  . PFIZER SARS-COV-2 Vaccination 01/09/2020, 02/02/2020  .  Pneumococcal Conjugate-13 07/30/2014  . Pneumococcal Polysaccharide-23 10/22/1998, 03/14/2016  . Td 07/23/1995  . Tdap 09/29/2011  . Zoster 07/30/2014  . Zoster Recombinat (Shingrix) 08/22/2019, 10/30/2019    TDAP status: Up to date Flu Vaccine status:  Up to date Pneumococcal vaccine status: Up to date Covid-19 vaccine status: Completed vaccines  Qualifies for Shingles Vaccine? Yes   Zostavax completed Yes   Shingrix Completed?: Yes  Screening Tests Health Maintenance  Topic Date Due  . TETANUS/TDAP  09/28/2021  . DEXA SCAN  10/29/2021  . MAMMOGRAM  01/04/2022  . Fecal DNA (Cologuard)  05/12/2023  . INFLUENZA VACCINE  Completed  . COVID-19 Vaccine  Completed  . Hepatitis C Screening  Completed  . PNA vac Low Risk Adult  Completed    Health Maintenance  There are no preventive care reminders to display for this patient.  Colorectal cancer screening: Cologuard completed 05/11/20. Repeat every 3 years Mammogram status: Completed 01/05/20. Repeat every year Bone Density status: Completed 10/29/18. Results reflect: Bone density results: OSTEOPENIA. Repeat every 3 years.  Lung Cancer Screening: (Low Dose CT Chest recommended if Age 85-80 years, 30 pack-year currently smoking OR have quit w/in 15years.) does not qualify.    Additional Screening:  Hepatitis C Screening: Up to date  Vision Screening: Recommended annual ophthalmology exams for early detection of glaucoma and other disorders of the eye. Is the patient up to date with their annual eye exam?  Yes  Who is the provider or what is the name of the office in which the patient attends annual eye exams? Dr Alexia Freestone  If pt is not established with a provider, would they like to be referred to a provider to establish care? No .   Dental Screening: Recommended annual dental exams for proper oral hygiene  Community Resource Referral / Chronic Care Management: CRR required this visit?  No   CCM required this visit?  No        Plan:     I have personally reviewed and noted the following in the patient's chart:   . Medical and social history . Use of alcohol, tobacco or illicit drugs  . Current medications and supplements . Functional ability and status . Nutritional status . Physical activity . Advanced directives . List of other physicians . Hospitalizations, surgeries, and ER visits in previous 12 months . Vitals . Screenings to include cognitive, depression, and falls . Referrals and appointments  In addition, I have reviewed and discussed with patient certain preventive protocols, quality metrics, and best practice recommendations. A written personalized care plan for preventive services as well as general preventive health recommendations were provided to patient.     Ariadna Setter [place], [place]   44/11/270   Nurse Notes: None.

## 2020-10-26 ENCOUNTER — Ambulatory Visit (INDEPENDENT_AMBULATORY_CARE_PROVIDER_SITE_OTHER): Payer: Medicare Other

## 2020-10-26 ENCOUNTER — Other Ambulatory Visit: Payer: Self-pay

## 2020-10-26 DIAGNOSIS — Z0000 Encounter for general adult medical examination without abnormal findings: Secondary | ICD-10-CM | POA: Diagnosis not present

## 2020-10-26 NOTE — Patient Instructions (Signed)
Gabrielle Salinas , Thank you for taking time to come for your Medicare Wellness Visit. I appreciate your ongoing commitment to your health goals. Please review the following plan we discussed and let me know if I can assist you in the future.   Screening recommendations/referrals: Colonoscopy: Cologuard up to date, due 04/2023 Mammogram: Up to date, due 12/2020 Bone Density: Up to date, due 10/2021 Recommended yearly ophthalmology/optometry visit for glaucoma screening and checkup Recommended yearly dental visit for hygiene and checkup  Vaccinations: Influenza vaccine: Done 08/03/20 Pneumococcal vaccine: Completed series Tdap vaccine: Up to date, due 09/2021 Shingles vaccine: Completed series    Advanced directives: Please bring a copy of your POA (Power of [place]) and/or Living Will to your next appointment.   Conditions/risks identified: Recommend to increase water intake to 6-8 8 oz glasses a day.  Next appointment: 11/05/20 @ 11:00 AM with Dr Sherrie Mustache  Preventive Care 28 Years and Older, Female Preventive care refers to lifestyle choices and visits with your health care provider that can promote health and wellness. What does preventive care include?  A yearly physical exam. This is also called an annual well check.  Dental exams once or twice a year.  Routine eye exams. Ask your health care provider how often you should have your eyes checked.  Personal lifestyle choices, including:  Daily care of your teeth and gums.  Regular physical activity.  Eating a healthy diet.  Avoiding tobacco and drug use.  Limiting alcohol use.  Practicing safe sex.  Taking low-dose aspirin every day.  Taking vitamin and mineral supplements as recommended by your health care provider. What happens during an annual well check? The services and screenings done by your health care provider during your annual well check will depend on your age, overall health, lifestyle risk factors, and family history of disease. Counseling  Your health care provider may ask you questions about your:  Alcohol use.  Tobacco use.  Drug use.  Emotional well-being.  Home and relationship well-being.  Sexual activity.  Eating habits.  History of falls.  Memory and ability to understand (cognition).  Work and work Astronomer.  Reproductive health. Screening  You may have the following tests or measurements:  Height, weight, and BMI.  Blood pressure.  Lipid and cholesterol levels. These may be checked every 5 years, or more frequently if you are over 76 years old.  Skin check.  Lung cancer screening. You may have this screening every year starting at age 21 if you have a 30-pack-year history of smoking and currently smoke or have quit within the past 15 years.  Fecal occult blood test (FOBT) of the stool. You may have this test every year starting at age 77.  Flexible sigmoidoscopy or colonoscopy. You may  have a sigmoidoscopy every 5 years or a colonoscopy every 10 years starting at age 59.  Hepatitis C blood test.  Hepatitis B blood test.  Sexually transmitted disease (STD) testing.  Diabetes screening. This is done by checking your blood sugar (glucose) after you have not eaten for a while (fasting). You may have this done every 1-3 years.  Bone density scan. This is done to screen for osteoporosis. You may have this done starting at age 69.  Mammogram. This may be done every 1-2 years. Talk to your health care provider about how often you should have regular mammograms. Talk with your health care provider about your test results, treatment options, and if necessary, the need for more tests. Vaccines  Your health care provider may recommend certain vaccines, such as:  Influenza vaccine. This is recommended every year.  Tetanus, diphtheria, and acellular pertussis (Tdap, Td) vaccine. You may need a Td booster every 10 years.  Zoster vaccine. You may need this after age 80.  Pneumococcal 13-valent conjugate (PCV13) vaccine. One dose is recommended after age 55.  Pneumococcal polysaccharide (PPSV23) vaccine. One dose is recommended after age 66. Talk to your health care provider about which screenings and vaccines you need and how often you need them. This information is not intended to replace advice given to you by your health care provider. Make sure you discuss any questions you have with your health care provider. Document Released: 12/10/2015 Document Revised: 08/02/2016 Document Reviewed: 09/14/2015 Elsevier Interactive Patient Education  2017 [employer].  Fall Prevention in the Home Falls can cause injuries. They can happen to people of all ages.  There are many things you can do to make your home safe and to help prevent falls. What can I do on the outside of my home?  Regularly fix the edges of walkways and driveways and fix any cracks.  Remove anything that might make  you trip as you walk through a door, such as a raised step or threshold.  Trim any bushes or trees on the path to your home.  Use bright outdoor lighting.  Clear any walking paths of anything that might make someone trip, such as rocks or tools.  Regularly check to see if handrails are loose or broken. Make sure that both sides of any steps have handrails.  Any raised decks and porches should have guardrails on the edges.  Have any leaves, snow, or ice cleared regularly.  Use sand or salt on walking paths during winter.  Clean up any spills in your garage right away. This includes oil or grease spills. What can I do in the bathroom?  Use night lights.  Install grab bars by the toilet and in the tub and shower. Do not use towel bars as grab bars.  Use non-skid mats or decals in the tub or shower.  If you need to sit down in the shower, use a plastic, non-slip stool.  Keep the floor dry. Clean up any water that spills on the floor as soon as it happens.  Remove soap buildup in the tub or shower regularly.  Attach bath mats securely with double-sided non-slip rug tape.  Do not have throw rugs and other things on the floor that can make you trip. What can I do in the bedroom?  Use night lights.  Make sure that you have a light by your bed that is easy to reach.  Do not use any sheets or blankets that are too big for your bed. They should not hang down onto the floor.  Have a firm chair that has side arms. You can use this for support while you get dressed.  Do not have throw rugs and other things on the floor that can make you trip. What can I do in the kitchen?  Clean up any spills right away.  Avoid walking on wet floors.  Keep items that you use a lot in easy-to-reach places.  If you need to reach something above you, use a strong step stool that has a grab bar.  Keep electrical cords out of the way.  Do not use floor polish or wax that makes floors slippery.  If you must use wax, use non-skid floor wax.  Do not have throw rugs and other things on the floor that can make you trip. What can I do with my stairs?  Do not leave any items on the stairs.  Make sure that there are handrails on both sides of the stairs and use them. Fix handrails that are broken or loose. Make sure that handrails are as long as the stairways.  Check any carpeting to make sure that it is firmly attached to the stairs. Fix any carpet that is loose or worn.  Avoid having throw rugs at the top or bottom of the stairs. If you do have throw rugs, attach them to the floor with carpet tape.  Make sure that you have a light switch at the top of the stairs and the bottom of the stairs. If you do not have them, ask someone to add them for you. What else can I do to  help prevent falls?  Wear shoes that:  Do not have high heels.  Have rubber bottoms.  Are comfortable and fit you well.  Are closed at the toe. Do not wear sandals.  If you use a stepladder:  Make sure that it is fully opened. Do not climb a closed stepladder.  Make sure that both sides of the stepladder are locked into place.  Ask someone to hold it for you, if possible.  Clearly mark and make sure that you can see:  Any grab bars or handrails.  First and last steps.  Where the edge of each step is.  Use tools that help you move around (mobility aids) if they are needed. These include:  Canes.  Walkers.  Scooters.  Crutches.  Turn on the lights when you go into a dark area. Replace any light bulbs as soon as they burn out.  Set up your furniture so you have a clear path. Avoid moving your furniture around.  If any of your floors are uneven, fix them.  If there are any pets around you, be aware of where they are.  Review your medicines with your doctor. Some medicines can make you feel dizzy. This can increase your chance of falling. Ask your doctor what other things that you can do to  help prevent falls. This information is not intended to replace advice given to you by your health care provider. Make sure you discuss any questions you have with your health care provider. Document Released: 09/09/2009 Document Revised: 04/20/2016 Document Reviewed: 12/18/2014 Elsevier Interactive Patient Education  2017 [employer]

## 2020-10-27 ENCOUNTER — Other Ambulatory Visit: Payer: Self-pay | Admitting: Family Medicine

## 2020-10-27 DIAGNOSIS — F411 Generalized anxiety disorder: Secondary | ICD-10-CM

## 2020-11-02 ENCOUNTER — Other Ambulatory Visit: Payer: Self-pay | Admitting: Family Medicine

## 2020-11-02 NOTE — Telephone Encounter (Signed)
Requested medication (s) are due for refill today:  yes  Requested medication (s) are on the active medication list: yes   Last refill:  10/16/2020  Future visit scheduled: yes   Notes to clinic:  this refill cannot be delegated   Requested Prescriptions  Pending Prescriptions Disp Refills   ALPRAZolam (XANAX) 0.25 MG tablet [Pharmacy Med Name: ALPRAZOLAM 0.25MG  TABLETS] 30 tablet     Sig: TAKE 1 TO 2 TABLETS BY MOUTH EVERY 4 HOURS AS NEEDED FOR ANXIETY. DO NOT EXCEED 4 TABLETS DAILY      Not Delegated - Psychiatry:  Anxiolytics/Hypnotics Failed - 11/02/2020  9:41 AM      Failed - This refill cannot be delegated      Failed - Urine Drug Screen completed in last 360 days      Passed - Valid encounter within last 6 months    Recent Outpatient Visits           3 months ago Prediabetes   [facility] Malva Limes, MD   8 months ago Annual physical exam   [facility] Malva Limes, MD   1 year ago Prediabetes   [facility] Malva Limes, MD   1 year ago Annual physical exam   [facility] Malva Limes, MD   2 years ago Back strain, initial encounter   [facility] Fisher, Demetrios Isaacs, MD       Future Appointments             In 3 days Fisher, Demetrios Isaacs, MD [facility]

## 2020-11-05 ENCOUNTER — Ambulatory Visit: Payer: Self-pay | Admitting: Family Medicine

## 2020-11-05 NOTE — Progress Notes (Deleted)
Established patient visit   Patient: Gabrielle Salinas   DOB: [birth year]   69 y.o. Female  MRN: [id number] Visit Date: 11/05/2020  Today's healthcare provider: [name], MD   No chief complaint on file.  Subjective    HPI  Follow up for obesity  The patient was last seen for this 3 months ago. Changes made at last visit include no changes. She is only taking topiramate and phentermine intermittently which she feels is maintaining her weight..  She reports {excellent/good/fair/poor:19665} compliance with treatment. She feels that condition is {improved/worse/unchanged:3041574}. She {is/is not:21021397} having side effects. ***  Wt Readings from Last 3 Encounters:  08/03/20 168 lb (76.2 kg)  02/23/20 180 lb (81.6 kg)  05/19/19 184 lb (83.5 kg)   Prediabetes, Follow-up  Lab Results  Component Value Date   HGBA1C 6.1 (A) 08/03/2020   HGBA1C 6.0 (H) 02/26/2020   HGBA1C 6.1 (A) 05/19/2019   GLUCOSE 129 (H) 02/26/2020   GLUCOSE 104 (H) 11/08/2018   GLUCOSE 113 (H) 07/03/2018    Last seen for for this3 months ago.  Management since that visit includes no changes. Current symptoms include {Symptoms; diabetes:14075} and have been {Desc; course:15616}.  Prior visit with dietician: {yes/no:17258} Current diet: {diet habits:16563} Current exercise: {exercise types:16438}  Pertinent Labs:    Component Value Date/Time   CHOL 231 (H) 02/26/2020 1106   TRIG 226 (H) 02/26/2020 1106   CHOLHDL 3.9 02/26/2020 1106   CREATININE 0.74 02/26/2020 1106    {Show patient history (optional):23778::" "}   Medications: Outpatient Medications Prior to Visit  Medication Sig  . acetaminophen (TYLENOL) 325 MG tablet Take 650 mg by mouth every 4 (four) hours as needed.  Marland Kitchen alendronate (FOSAMAX) 70 MG tablet TAKE 1 TABLET BY MOUTH EVERY WEEK WITH A FULL GLASS OF WATER AND ON AN EMPTY STOMACH  . ALPRAZolam (XANAX) 0.25 MG tablet TAKE 1 TO 2 TABLETS BY MOUTH EVERY 4 HOURS AS NEEDED  FOR ANXIETY. DO NOT EXCEED 4 TABLETS DAILY  . Ascorbic Acid (VITAMIN C) 100 MG tablet Take 100 mg by mouth daily.  . Calcium Carb-Cholecalciferol (CALCIUM PLUS VITAMIN D3) 600-500 MG-UNIT CAPS Take 600 Units by mouth daily. (Patient not taking: Reported on 10/26/2020)  . cholecalciferol (VITAMIN D) 1000 units tablet Take 1 tablet by mouth daily.  . clorazepate (TRANXENE) 15 MG tablet TAKE 1/2 TABLET BY MOUTH THREE TIMES DAILY AS NEEDED  . esomeprazole (NEXIUM) 40 MG capsule TAKE 1 CAPSULE BY MOUTH EVERY DAY AT NOON  . ezetimibe (ZETIA) 10 MG tablet TAKE 1 TABLET(10 MG) BY MOUTH DAILY  . FLUoxetine (PROZAC) 20 MG capsule TAKE 1 CAPSULE BY MOUTH EVERY DAY  . hydrochlorothiazide (HYDRODIURIL) 12.5 MG tablet TAKE 1 TABLET(12.5 MG) BY MOUTH DAILY  . lisinopril (ZESTRIL) 10 MG tablet TAKE 1 TABLET BY MOUTH  DAILY  . Misc Natural Products (JOINT SUPPORT PO) Take by mouth daily.   . montelukast (SINGULAIR) 10 MG tablet TAKE 1 TABLET BY MOUTH EVERY DAY FOR ALLERGIES  . Multiple Vitamins-Minerals (MULTIVITAMIN ADULT PO) Take 1 tablet by mouth daily.  . Multiple Vitamins-Minerals (PRESERVISION AREDS 2 PO) Take 1 tablet by mouth 2 (two) times daily.  . Omega-3 Fatty Acids (FISH OIL) 1000 MG CAPS Take 1 capsule by mouth daily.   . phentermine 15 MG capsule TAKE 1 CAPSULE(15 MG) BY MOUTH EVERY MORNING  . topiramate (TOPAMAX) 50 MG tablet Take 2 tablets (100 mg total) by mouth daily.  Marland Kitchen triamcinolone cream (KENALOG) 0.5 %  Apply 1 application topically daily as needed.  . TURMERIC PO Take by mouth daily.   . vitamin E 1000 UNIT capsule Take 1,000 Units by mouth daily.    No facility-administered medications prior to visit.    Review of Systems  Constitutional: Negative.   Respiratory: Negative.   Cardiovascular: Negative.   Gastrointestinal: Negative.   Neurological: Negative.   Psychiatric/Behavioral: Negative.     {Heme  Chem  Endocrine  Serology  Results Review (optional):23779::" "}   Objective    LMP 11/27/1980  {Show previous vital signs (optional):23777::" "}  Physical Exam  ***  No results found for any visits on 11/05/20.  Assessment & Plan     ***  No follow-ups on file.      {provider attestation***:1}   [name], MD  [facility] [phone] (phone) [phone] (fax)  [facility]

## 2020-11-10 ENCOUNTER — Ambulatory Visit (INDEPENDENT_AMBULATORY_CARE_PROVIDER_SITE_OTHER): Payer: Medicare Other | Admitting: Family Medicine

## 2020-11-10 ENCOUNTER — Encounter: Payer: Self-pay | Admitting: Family Medicine

## 2020-11-10 ENCOUNTER — Other Ambulatory Visit: Payer: Self-pay

## 2020-11-10 VITALS — BP 129/76 | HR 85 | Temp 97.5°F | Resp 16 | Wt 166.4 lb

## 2020-11-10 DIAGNOSIS — E663 Overweight: Secondary | ICD-10-CM

## 2020-11-10 DIAGNOSIS — F411 Generalized anxiety disorder: Secondary | ICD-10-CM

## 2020-11-10 MED ORDER — AZELASTINE HCL 0.1 % NA SOLN: 2.0000 | Freq: Two times a day (BID) | NASAL | 3 refills | Status: DC

## 2020-11-10 NOTE — Progress Notes (Signed)
Established patient visit   Patient: Gabrielle Salinas   DOB: 04-07-1951   69 y.o. Female  MRN: [id number] Visit Date: 11/10/2020  Today's healthcare provider: [name], MD   Chief Complaint  Patient presents with  . Obesity   Subjective    HPI  Follow up for obesity:  The patient was last seen for this 3 months ago. Changes made at last visit include continuing same medications. She was only taking topiramate and phentermine intermittently which she feels was helping to maintain her weight.  Wt Readings from Last 3 Encounters:  11/10/20 166 lb 6.4 oz (75.5 kg)  08/03/20 168 lb (76.2 kg)  02/23/20 180 lb (81.6 kg)    She reports good compliance with treatment. She feels that condition is Unchanged. She is not having side effects.   ----------------------------------------------------------------------------------------- She also reports she has been under lot of stress related to family issues, but has started seeing a counselor which has been very helpful.   She also states that montelukast is no longer helping with allergies.      Medications: Outpatient Medications Prior to Visit  Medication Sig  . acetaminophen (TYLENOL) 325 MG tablet Take 650 mg by mouth every 4 (four) hours as needed.  Marland Kitchen alendronate (FOSAMAX) 70 MG tablet TAKE 1 TABLET BY MOUTH EVERY WEEK WITH A FULL GLASS OF WATER AND ON AN EMPTY STOMACH  . ALPRAZolam (XANAX) 0.25 MG tablet TAKE 1 TO 2 TABLETS BY MOUTH EVERY 4 HOURS AS NEEDED FOR ANXIETY. DO NOT EXCEED 4 TABLETS DAILY  . Ascorbic Acid (VITAMIN C) 100 MG tablet Take 100 mg by mouth daily.  . Calcium Carb-Cholecalciferol 600-500 MG-UNIT CAPS Take 600 Units by mouth daily.  . cholecalciferol (VITAMIN D) 1000 units tablet Take 1 tablet by mouth daily.  . clorazepate (TRANXENE) 15 MG tablet TAKE 1/2 TABLET BY MOUTH THREE TIMES DAILY AS NEEDED  . esomeprazole (NEXIUM) 40 MG capsule TAKE 1 CAPSULE BY MOUTH EVERY DAY AT NOON  . ezetimibe  (ZETIA) 10 MG tablet TAKE 1 TABLET(10 MG) BY MOUTH DAILY  . FLUoxetine (PROZAC) 20 MG capsule TAKE 1 CAPSULE BY MOUTH EVERY DAY  . hydrochlorothiazide (HYDRODIURIL) 12.5 MG tablet TAKE 1 TABLET(12.5 MG) BY MOUTH DAILY  . lisinopril (ZESTRIL) 10 MG tablet TAKE 1 TABLET BY MOUTH  DAILY  . Misc Natural Products (JOINT SUPPORT PO) Take by mouth daily.   . montelukast (SINGULAIR) 10 MG tablet TAKE 1 TABLET BY MOUTH EVERY DAY FOR ALLERGIES  . Multiple Vitamins-Minerals (MULTIVITAMIN ADULT PO) Take 1 tablet by mouth daily.  . Multiple Vitamins-Minerals (PRESERVISION AREDS 2 PO) Take 1 tablet by mouth 2 (two) times daily.  . Omega-3 Fatty Acids (FISH OIL) 1000 MG CAPS Take 1 capsule by mouth daily.   . phentermine 15 MG capsule TAKE 1 CAPSULE(15 MG) BY MOUTH EVERY MORNING  . topiramate (TOPAMAX) 50 MG tablet Take 2 tablets (100 mg total) by mouth daily.  Marland Kitchen triamcinolone cream (KENALOG) 0.5 % Apply 1 application topically daily as needed.  . TURMERIC PO Take by mouth daily.   . vitamin E 1000 UNIT capsule Take 1,000 Units by mouth daily.    No facility-administered medications prior to visit.    Review of Systems  Constitutional: Negative for appetite change, chills, fatigue and fever.  Respiratory: Negative for chest tightness and shortness of breath.   Cardiovascular: Negative for chest pain and palpitations.  Gastrointestinal: Negative for abdominal pain, nausea and vomiting.  Neurological: Negative for dizziness  and weakness.      Objective    BP 129/76 (BP Location: Right Arm, Patient Position: Sitting, Cuff Size: Large)   Pulse 85   Temp (!) 97.5 F (36.4 C) (Oral)   Resp 16   Wt 166 lb 6.4 oz (75.5 kg)   LMP 11/27/1980   BMI 29.48 kg/m    Physical Exam   General appearance:  Overweight female, cooperative and in no acute distress Head: Normocephalic, without obvious abnormality, atraumatic Respiratory: Respirations even and unlabored, normal respiratory rate Extremities:  All extremities are intact.  Skin: Skin color, texture, turgor normal. No rashes seen  Psych: Appropriate mood and affect. Neurologic: Mental status: Alert, oriented to person, place, and time, thought content appropriate.   No results found for any visits on 11/10/20.  Assessment & Plan     1. Overweight Maintaining weight loss on topiramate and intermittent phentermine. Continue current medications.    2. Anxiety state Better since she started visiting with counselor. Doing well with current dose of fluoxetine. .   3. Allergic rhinitis Singulair and loratadine no longer controlling symptoms. Has tried OTC Flonase nasal spray which did not help  Will try.   azelastine (ASTELIN) 0.1 % nasal spray; Place 2 sprays into both nostrils 2 (two) times daily. Use in each nostril as directed  Dispense: 30 mL; Refill: 3            [name], MD  [facility] [phone] (phone) [phone] (fax)  [facility]

## 2020-11-27 ENCOUNTER — Other Ambulatory Visit: Payer: Self-pay | Admitting: Family Medicine

## 2020-12-10 ENCOUNTER — Other Ambulatory Visit: Payer: Self-pay | Admitting: Family Medicine

## 2020-12-10 MED ORDER — HYDROCHLOROTHIAZIDE 12.5 MG PO TABS: ORAL_TABLET | ORAL | 1 refills | Status: DC

## 2020-12-10 MED ORDER — ALPRAZOLAM 0.25 MG PO TABS: ORAL_TABLET | ORAL | 5 refills | Status: DC

## 2020-12-10 NOTE — Telephone Encounter (Signed)
Medication Refill - Medication: hydrochlorothiazide (HYDRODIURIL) 12.5 MG tablet ALPRAZolam (XANAX) 0.25 MG tablet   Pt has two pills left   Has the patient contacted their pharmacy? Yes.   (Agent: If no, request that the patient contact the pharmacy for the refill.) (Agent: If yes, when and what did the pharmacy advise?)  Preferred Pharmacy (with phone number or street name):  [facility] #[id number] Nicholes Rough, [place] - [address] AT [facility] OF [address]  [address] [place] [address]  Phone: [phone] Fax: [phone]    Agent: Please be advised that RX refills may take up to 3 business days. We ask that you follow-up with your pharmacy.

## 2020-12-10 NOTE — Telephone Encounter (Signed)
Requested medication (s) are due for refill today: no  Requested medication (s) are on the active medication list: yes  Last refill:  11/02/2020  Future visit scheduled: yes  Notes to clinic:  this refill cannot be delegated   Requested Prescriptions  Pending Prescriptions Disp Refills   ALPRAZolam (XANAX) 0.25 MG tablet 30 tablet 5    Sig: TAKE 1 TO 2 TABLETS BY MOUTH EVERY 4 HOURS AS NEEDED FOR ANXIETY. DO NOT EXCEED 4 TABLETS DAILY      Not Delegated - Psychiatry:  Anxiolytics/Hypnotics Failed - 12/10/2020  1:26 PM      Failed - This refill cannot be delegated      Failed - Urine Drug Screen completed in last 360 days      Passed - Valid encounter within last 6 months    Recent Outpatient Visits           1 month ago Overweight   [facility] Malva Limes, MD   4 months ago Prediabetes   [facility] Malva Limes, MD   9 months ago Annual physical exam   [facility] Malva Limes, MD   1 year ago Prediabetes   [facility] Malva Limes, MD   2 years ago Annual physical exam   [facility] Malva Limes, MD       Future Appointments             In 2 months Fisher, Demetrios Isaacs, MD [facility]              Signed Prescriptions Disp Refills   hydrochlorothiazide (HYDRODIURIL) 12.5 MG tablet 90 tablet 1    Sig: TAKE 1 TABLET(12.5 MG) BY MOUTH DAILY      Cardiovascular: Diuretics - Thiazide Passed - 12/10/2020  1:26 PM      Passed - Ca in normal range and within 360 days    Calcium  Date Value Ref Range Status  02/26/2020 9.3 8.7 - 10.3 mg/dL Final          Passed - Cr in normal range and within 360 days    Creatinine, Ser  Date Value Ref Range Status  02/26/2020 0.74 0.57 - 1.00 mg/dL Final          Passed - K in normal range and within 360 days    Potassium  Date Value Ref Range Status  02/26/2020 3.6 3.5 - 5.2 mmol/L Final          Passed -  Na in normal range and within 360 days    Sodium  Date Value Ref Range Status  02/26/2020 139 134 - 144 mmol/L Final          Passed - Last BP in normal range    BP Readings from Last 1 Encounters:  11/10/20 129/76          Passed - Valid encounter within last 6 months    Recent Outpatient Visits           1 month ago Overweight   [facility] Malva Limes, MD   4 months ago Prediabetes   [facility] Malva Limes, MD   9 months ago Annual physical exam   [facility] Malva Limes, MD   1 year ago Prediabetes   [facility] Malva Limes, MD   2 years ago Annual physical exam   [facility] Malva Limes,  MD       Future Appointments             In 2 months Fisher, Demetrios Isaacs, MD [facility]

## 2020-12-28 ENCOUNTER — Other Ambulatory Visit: Payer: Self-pay | Admitting: Family Medicine

## 2021-01-01 ENCOUNTER — Other Ambulatory Visit: Payer: Self-pay | Admitting: Family Medicine

## 2021-01-01 DIAGNOSIS — F411 Generalized anxiety disorder: Secondary | ICD-10-CM

## 2021-01-10 ENCOUNTER — Other Ambulatory Visit: Payer: Self-pay | Admitting: Family Medicine

## 2021-01-28 ENCOUNTER — Other Ambulatory Visit: Payer: Self-pay | Admitting: Family Medicine

## 2021-01-28 DIAGNOSIS — Z6833 Body mass index (BMI) 33.0-33.9, adult: Secondary | ICD-10-CM

## 2021-01-28 DIAGNOSIS — E669 Obesity, unspecified: Secondary | ICD-10-CM

## 2021-02-28 ENCOUNTER — Encounter: Payer: Self-pay | Admitting: Family Medicine

## 2021-02-28 ENCOUNTER — Telehealth: Payer: Self-pay

## 2021-02-28 NOTE — Telephone Encounter (Signed)
Copied from CRM [id number]. Topic: Appointment Scheduling - Scheduling Inquiry for Clinic >> Feb 28, 2021  4:17 PM Aretta Nip wrote: Reason for CRM: Pt states called in this am to confirm appt, states just told employee that she had been to a funeral this weekend and she did not think she was exposed to covid in last 14 days, but she of course did not know for sure, she said she was told would need to resch appt and get a test. Pt went to the drugstore and  got tested and is negative, called back to resch and only has results on her phone and was worried she did not have a paper copy and wanted to go ahead to resch. Called office, Michelle Nasuti, was told to send CRM. Pt is hoping to get rescheduled asap.   [phone]

## 2021-02-28 NOTE — Telephone Encounter (Signed)
Please call patient to reschedule CPE appointment.

## 2021-03-02 ENCOUNTER — Other Ambulatory Visit: Payer: Self-pay | Admitting: Family Medicine

## 2021-03-02 DIAGNOSIS — F411 Generalized anxiety disorder: Secondary | ICD-10-CM

## 2021-03-08 ENCOUNTER — Other Ambulatory Visit: Payer: Self-pay | Admitting: Family Medicine

## 2021-03-08 NOTE — Telephone Encounter (Signed)
Requested medication (s) are due for refill today: no  Requested medication (s) are on the active medication list: yes  Last refill: 02/13/2021  Future visit scheduled: yes  Notes to clinic:  this refill cannot be delegated    Requested Prescriptions  Pending Prescriptions Disp Refills   clorazepate (TRANXENE) 15 MG tablet [Pharmacy Med Name: CLORAZEPATE 15MG  TABLETS] 30 tablet     Sig: TAKE 1/2 TABLET BY MOUTH THREE TIMES DAILY AS NEEDED      Not Delegated - Psychiatry:  Anxiolytics/Hypnotics Failed - 03/08/2021  9:19 AM      Failed - This refill cannot be delegated      Failed - Urine Drug Screen completed in last 360 days      Passed - Valid encounter within last 6 months    Recent Outpatient Visits           3 months ago Overweight   [facility], [facility]   7 months ago Prediabetes   [facility], MD   1 year ago Annual physical exam   [facility], MD   1 year ago Prediabetes   [facility], MD   2 years ago Annual physical exam   [facility], MD       Future Appointments             In 1 month Fisher, Malva Limes, MD [facility]

## 2021-03-12 ENCOUNTER — Other Ambulatory Visit: Payer: Self-pay | Admitting: Family Medicine

## 2021-03-12 DIAGNOSIS — F411 Generalized anxiety disorder: Secondary | ICD-10-CM

## 2021-03-16 ENCOUNTER — Other Ambulatory Visit: Payer: Self-pay | Admitting: Family Medicine

## 2021-03-16 DIAGNOSIS — F411 Generalized anxiety disorder: Secondary | ICD-10-CM

## 2021-03-16 MED ORDER — FLUOXETINE HCL 20 MG PO CAPS: 20.0000 mg | ORAL_CAPSULE | Freq: Every day | ORAL | 0 refills | Status: DC

## 2021-03-16 NOTE — Telephone Encounter (Signed)
Requested Prescriptions  Pending Prescriptions Disp Refills  . FLUoxetine (PROZAC) 20 MG capsule 90 capsule 0    Sig: Take 1 capsule (20 mg total) by mouth daily.     Psychiatry:  Antidepressants - SSRI Passed - 03/16/2021  5:50 PM      Passed - Completed PHQ-2 or PHQ-9 in the last 360 days      Passed - Valid encounter within last 6 months    Recent Outpatient Visits          4 months ago Overweight   [facility] Malva Limes, MD   7 months ago Prediabetes   [facility] Malva Limes, MD   1 year ago Annual physical exam   [facility] Malva Limes, MD   1 year ago Prediabetes   [facility] Malva Limes, MD   2 years ago Annual physical exam   [facility] Malva Limes, MD      Future Appointments            In 1 month Fisher, Demetrios Isaacs, MD [facility]

## 2021-03-16 NOTE — Telephone Encounter (Signed)
Medication Refill - Medication:   FLUoxetine (PROZAC) 20 MG capsule   Has the patient contacted their pharmacy? Yes.  no refills left  Preferred Pharmacy (with phone number or street name):   [facility] #[id number] Nicholes Rough, [place] - [address] AT [facility] [address]  [address] [place] [address]  Phone: [phone] Fax: [phone]    Agent: Please be advised that RX refills may take up to 3 business days. We ask that you follow-up with your pharmacy.

## 2021-03-28 DIAGNOSIS — Z961 Presence of intraocular lens: Secondary | ICD-10-CM | POA: Diagnosis not present

## 2021-03-29 ENCOUNTER — Other Ambulatory Visit: Payer: Self-pay | Admitting: Family Medicine

## 2021-03-29 NOTE — Telephone Encounter (Signed)
Copied from CRM [id number]. Topic: Quick Communication - Rx Refill/Question >> Mar 29, 2021 12:42 PM Marylen Ponto wrote: Medication: ALPRAZolam Prudy Feeler) 0.25 MG tablet  Has the patient contacted their pharmacy? yes - pt stated she has gotten no response from pharmacy  Preferred Pharmacy (with phone number or street name): [facility] #[id number] Nicholes Rough, [place] - [address] AT [facility] [address] Phone: [phone]   Fax: [phone]  Agent: Please be advised that RX refills may take up to 3 business days. We ask that you follow-up with your pharmacy.

## 2021-03-29 NOTE — Telephone Encounter (Signed)
Requested medication (s) are due for refill today:  no  Requested medication (s) are on the active medication list: yes  Last refill: 01/28/2021  Future visit scheduled: yes  Notes to clinic:  this refill cannot be delegated Patient states that she has not gotten any response from pharmacy    Requested Prescriptions  Pending Prescriptions Disp Refills   ALPRAZolam (XANAX) 0.25 MG tablet 30 tablet 3    Sig: TAKE 1 TO 2 TABLETS BY MOUTH EVERY 4 HOURS AS NEEDED FOR ANXIETY. DO NOT EXCEED 4 TABLETS DAILY      There is no refill protocol information for this order

## 2021-03-30 MED ORDER — ALPRAZOLAM 0.25 MG PO TABS: ORAL_TABLET | ORAL | 5 refills | Status: DC

## 2021-04-11 ENCOUNTER — Other Ambulatory Visit: Payer: Self-pay | Admitting: Family Medicine

## 2021-04-11 NOTE — Telephone Encounter (Signed)
Requested Prescriptions  Pending Prescriptions Disp Refills  . esomeprazole (NEXIUM) 40 MG capsule [Pharmacy Med Name: ESOMEPRAZOLE MAGNESIUM 40MG  DR CAPS] 90 capsule 0    Sig: TAKE 1 CAPSULE BY MOUTH EVERY DAY AT NOON     Gastroenterology: Proton Pump Inhibitors Passed - 04/11/2021  3:42 AM      Passed - Valid encounter within last 12 months    Recent Outpatient Visits          5 months ago Overweight   [facility], MD   8 months ago Prediabetes   [facility], MD   1 year ago Annual physical exam   [facility], MD   1 year ago Prediabetes   [facility], MD   2 years ago Annual physical exam   [facility], MD      Future Appointments            In 2 weeks Fisher, Malva Limes, MD [facility]

## 2021-04-12 NOTE — Telephone Encounter (Signed)
Error

## 2021-04-22 ENCOUNTER — Other Ambulatory Visit: Payer: Self-pay | Admitting: Family Medicine

## 2021-04-22 DIAGNOSIS — Z6833 Body mass index (BMI) 33.0-33.9, adult: Secondary | ICD-10-CM

## 2021-04-22 DIAGNOSIS — E669 Obesity, unspecified: Secondary | ICD-10-CM

## 2021-04-22 NOTE — Telephone Encounter (Signed)
Requested medications are due for refill today yes  Requested medications are on the active medication list yes  Last refill 2/28  Last visit 10/2020  Future visit scheduled 10/2021  Notes to clinic Not Delegated.

## 2021-04-26 ENCOUNTER — Ambulatory Visit (INDEPENDENT_AMBULATORY_CARE_PROVIDER_SITE_OTHER): Payer: Medicare Other | Admitting: Family Medicine

## 2021-04-26 ENCOUNTER — Other Ambulatory Visit: Payer: Self-pay

## 2021-04-26 ENCOUNTER — Encounter: Payer: Self-pay | Admitting: Family Medicine

## 2021-04-26 VITALS — BP 119/66 | HR 85 | Ht 63.0 in | Wt 171.0 lb

## 2021-04-26 DIAGNOSIS — Z23 Encounter for immunization: Secondary | ICD-10-CM

## 2021-04-26 DIAGNOSIS — Z1231 Encounter for screening mammogram for malignant neoplasm of breast: Secondary | ICD-10-CM

## 2021-04-26 DIAGNOSIS — E785 Hyperlipidemia, unspecified: Secondary | ICD-10-CM | POA: Diagnosis not present

## 2021-04-26 DIAGNOSIS — R7303 Prediabetes: Secondary | ICD-10-CM

## 2021-04-26 DIAGNOSIS — Z0000 Encounter for general adult medical examination without abnormal findings: Secondary | ICD-10-CM | POA: Diagnosis not present

## 2021-04-26 MED ORDER — TETANUS-DIPHTH-ACELL PERTUSSIS 5-2.5-18.5 LF-MCG/0.5 IM SUSP: 0.5000 mL | Freq: Once | INTRAMUSCULAR | 0 refills | Status: AC

## 2021-04-26 NOTE — Progress Notes (Signed)
Complete Physical     Patient: Gabrielle Salinas, Female    DOB: 05-22-1951, 70 y.o.   MRN: [id number] Visit Date: 04/26/2021  Today's Provider: [name], MD   Chief Complaint  Patient presents with  . Annual Exam   Subjective    Gabrielle Salinas is a 69 y.o. female who presents today for her Complete Physical She reports consuming a general diet. The patient does not participate in regular exercise at present. She generally feels well. She reports sleeping well. She does have additional problems to discuss today.   HPI   Allergies:  Pt states Singulair is not working as well.   Phentermine and Topamax : are not working as well as it did.   Patient Active Problem List   Diagnosis Date Noted  . Macular degeneration, age related 08/03/2020  . Insomnia 11/08/2018  . Osteoporosis 04/20/2016  . GERD (gastroesophageal reflux disease) 03/07/2016  . Anxiety state 03/07/2016  . Arthralgia 03/07/2016  . Right knee pain 03/07/2016  . Menopausal symptom 03/07/2016  . Contact dermatitis 03/07/2016  . Hemorrhage of gastrointestinal tract 10/17/2007  . Cervicalgia 10/17/2007  . HLD (hyperlipidemia) 03/21/2007  . Prediabetes 03/21/2007  . Allergic rhinitis due to pollen 11/27/2001  . Depressive disorder 11/27/2001  . Hypertension 11/27/1997   Past Medical History:  Diagnosis Date  . Allergy   . Anxiety   . Cataract   . Depression   . GERD (gastroesophageal reflux disease)   . Hyperlipidemia   . Hypertension   . Osteoporosis    Social History   Tobacco Use  . Smoking status: Never Smoker  . Smokeless tobacco: Never Used  Vaping Use  . Vaping Use: Never used  Substance Use Topics  . Alcohol use: Yes    Alcohol/week: 0.0 standard drinks    Comment: 1glass of wine on special occasions  . Drug use: Never   Allergies  Allergen Reactions  . Bactrim [Sulfamethoxazole-Trimethoprim] Itching  . Bee Venom Swelling  . Daypro  [Oxaprozin]   . Sulfa Antibiotics  Diarrhea  . Sulfasalazine      Medications: Outpatient Medications Prior to Visit  Medication Sig  . acetaminophen (TYLENOL) 325 MG tablet Take 650 mg by mouth every 4 (four) hours as needed.  Marland Kitchen alendronate (FOSAMAX) 70 MG tablet TAKE 1 TABLET BY MOUTH EVERY WEEK WITH A FULL GLASS OF WATER AND ON AN EMPTY STOMACH  . ALPRAZolam (XANAX) 0.25 MG tablet TAKE 1 TO 2 TABLETS BY MOUTH EVERY 4 HOURS AS NEEDED FOR ANXIETY. DO NOT EXCEED 4 TABLETS DAILY  . Ascorbic Acid (VITAMIN C) 100 MG tablet Take 100 mg by mouth daily.  Marland Kitchen azelastine (ASTELIN) 0.1 % nasal spray Place 2 sprays into both nostrils 2 (two) times daily. Use in each nostril as directed  . Calcium Carb-Cholecalciferol 600-500 MG-UNIT CAPS Take 600 Units by mouth daily.  . cholecalciferol (VITAMIN D) 1000 units tablet Take 1 tablet by mouth daily.  . clorazepate (TRANXENE) 15 MG tablet TAKE 1/2 TABLET BY MOUTH THREE TIMES DAILY AS NEEDED  . esomeprazole (NEXIUM) 40 MG capsule TAKE 1 CAPSULE BY MOUTH EVERY DAY AT NOON  . ezetimibe (ZETIA) 10 MG tablet TAKE 1 TABLET(10 MG) BY MOUTH DAILY  . FLUoxetine (PROZAC) 20 MG capsule Take 1 capsule (20 mg total) by mouth daily.  . hydrochlorothiazide (HYDRODIURIL) 12.5 MG tablet TAKE 1 TABLET(12.5 MG) BY MOUTH DAILY  . lisinopril (ZESTRIL) 10 MG tablet TAKE 1 TABLET(10 MG) BY MOUTH DAILY  .  Misc Natural Products (JOINT SUPPORT PO) Take by mouth daily.   . montelukast (SINGULAIR) 10 MG tablet TAKE 1 TABLET BY MOUTH EVERY DAY FOR ALLERGIES  . Multiple Vitamins-Minerals (MULTIVITAMIN ADULT PO) Take 1 tablet by mouth daily.  . Multiple Vitamins-Minerals (PRESERVISION AREDS 2 PO) Take 1 tablet by mouth 2 (two) times daily.  . Omega-3 Fatty Acids (FISH OIL) 1000 MG CAPS Take 1 capsule by mouth daily.   . phentermine 15 MG capsule Take 1 capsule (15 mg total) by mouth every morning.  . topiramate (TOPAMAX) 50 MG tablet Take 2 tablets (100 mg total) by mouth daily.  Marland Kitchen triamcinolone cream (KENALOG) 0.5 %  Apply 1 application topically daily as needed.  . TURMERIC PO Take by mouth daily.   . vitamin E 1000 UNIT capsule Take 1,000 Units by mouth daily.    No facility-administered medications prior to visit.    Allergies  Allergen Reactions  . Bactrim [Sulfamethoxazole-Trimethoprim] Itching  . Bee Venom Swelling  . Daypro  [Oxaprozin]   . Sulfa Antibiotics Diarrhea  . Sulfasalazine     Patient Care Team: Malva Limes, MD as PCP - General (Family Medicine) Patty, A. Azucena Kuba, MD as Consulting Physician (Ophthalmology) Juanell Fairly, MD as Referring Physician (Orthopedic Surgery) Elwin Mocha, MD as Consulting Physician (Ophthalmology)  Review of Systems  Constitutional: Negative.   HENT: Negative.   Eyes: Negative.   Respiratory: Negative.   Cardiovascular: Negative.   Gastrointestinal: Negative.   Endocrine: Negative.   Genitourinary: Negative.   Musculoskeletal: Negative.   Skin: Negative.   Allergic/Immunologic: Negative.   Neurological: Negative.   Hematological: Negative.   Psychiatric/Behavioral: Negative.         Objective    Vitals: BP 119/66 (BP Location: Right Arm, Patient Position: Sitting, Cuff Size: Large)   Pulse 85   Ht 5\' 3"  (1.6 m)   Wt 171 lb (77.6 kg)   LMP 11/27/1980   SpO2 100%   BMI 30.29 kg/m    Physical Exam   General Appearance:    Obese female. Alert, cooperative, in no acute distress, appears stated age   Head:    Normocephalic, without obvious abnormality, atraumatic  Eyes:    PERRL, conjunctiva/corneas clear, EOM's intact, fundi    benign, both eyes  Ears:    Normal TM's and external ear canals, both ears  Neck:   Supple, symmetrical, trachea midline, no adenopathy;    thyroid:  no enlargement/tenderness/nodules; no carotid   bruit or JVD  Back:     Symmetric, no curvature, ROM normal, no CVA tenderness  Lungs:     Clear to auscultation bilaterally, respirations unlabored  Chest Wall:    No tenderness or deformity    Heart:    Normal heart rate. Normal rhythm. No murmurs, rubs, or gallops.   Breast Exam:    normal appearance, no masses or tenderness  Abdomen:     Soft, non-tender, bowel sounds active all four quadrants,    no masses, no organomegaly  Pelvic:    deferred and not indicated; status post hysterectomy, negative ROS  Extremities:   All extremities are intact. No cyanosis or edema  Pulses:   2+ and symmetric all extremities  Skin:   Skin color, texture, turgor normal, no rashes or lesions  Lymph nodes:   Cervical, supraclavicular, and axillary nodes normal  Neurologic:   CNII-XII intact, normal strength, sensation and reflexes    throughout     Most recent functional status assessment: In your present  state of health, do you have any difficulty performing the following activities: 10/26/2020  Hearing? N  Vision? N  Difficulty concentrating or making decisions? N  Walking or climbing stairs? N  Dressing or bathing? N  Doing errands, shopping? N  Preparing Food and eating ? N  Using the Toilet? N  In the past six months, have you accidently leaked urine? N  Do you have problems with loss of bowel control? N  Managing your Medications? N  Managing your Finances? N  Housekeeping or managing your Housekeeping? N  Some recent data might be hidden   Most recent fall risk assessment: Fall Risk  10/26/2020  Falls in the past year? 0  Number falls in past yr: 0  Comment -  Injury with Fall? 0  Follow up -    Most recent depression screenings: PHQ 2/9 Scores 11/10/2020 10/26/2020  PHQ - 2 Score 1 0  PHQ- 9 Score 7 -   Most recent cognitive screening: 6CIT Screen 09/28/2017  What Year? 0 points  What month? 0 points  What time? 0 points  Count back from 20 0 points  Months in reverse 0 points  Repeat phrase 2 points  Total Score 2   Most recent Audit-C alcohol use screening Alcohol Use Disorder Test (AUDIT) 10/26/2020  1. How often do you have a drink containing alcohol? 1   2. How many drinks containing alcohol do you have on a typical day when you are drinking? 0  3. How often do you have six or more drinks on one occasion? 0  AUDIT-C Score 1  Alcohol Brief Interventions/Follow-up AUDIT Score <7 follow-up not indicated   A score of 3 or more in women, and 4 or more in men indicates increased risk for alcohol abuse, EXCEPT if all of the points are from question 1   No results found for any visits on 04/26/21.  Assessment & Plan       1. Annual physical exam   2. Encounter for screening mammogram for malignant neoplasm of breast Mammogram ordered  3. Tdap prescription, not administered in office.   - Tdap (BOOSTRIX) 5-2.5-18.5 LF-MCG/0.5 injection; Inject 0.5 mLs into the muscle once for 1 dose.  Dispense: 0.5 mL; Refill: 0  4. Hyperlipidemia, unspecified hyperlipidemia type Doing well with ezetimibe.   - Comprehensive metabolic panel - Lipid panel  5. Prediabetes  - Hemoglobin A1c        The entirety of the information documented in the History of Present Illness, Review of Systems and Physical Exam were personally obtained by me. Portions of this information were initially documented by the CMA and reviewed by me for thoroughness and accuracy.      [name], MD  [facility] [phone] (phone) [phone] (fax)  [facility]

## 2021-04-26 NOTE — Patient Instructions (Signed)
.   Please review the attached list of medications and notify my office if there are any errors.   . Please bring all of your medications to every appointment so we can make sure that our medication list is the same as yours.   . Please call the [facility] ([phone]) to schedule a routine screening mammogram.  

## 2021-04-27 LAB — LIPID PANEL
Chol/HDL Ratio: 3.3 ratio (ref 0.0–4.4)
Cholesterol, Total: 218 mg/dL — ABNORMAL HIGH (ref 100–199)
HDL: 67 mg/dL (ref >39)
LDL Chol Calc (NIH): 124 mg/dL — ABNORMAL HIGH (ref 0–99)
Triglycerides: 156 mg/dL — ABNORMAL HIGH (ref 0–149)
VLDL Cholesterol Cal: 27 mg/dL (ref 5–40)

## 2021-04-27 LAB — COMPREHENSIVE METABOLIC PANEL
ALT: 28 IU/L (ref 0–32)
AST: 29 IU/L (ref 0–40)
Albumin/Globulin Ratio: 2 (ref 1.2–2.2)
Albumin: 4.5 g/dL (ref 3.7–4.7)
Alkaline Phosphatase: 71 IU/L (ref 44–121)
BUN/Creatinine Ratio: 20 (ref 12–28)
BUN: 15 mg/dL (ref 8–27)
Bilirubin Total: 0.3 mg/dL (ref 0.0–1.2)
CO2: 25 mmol/L (ref 20–29)
Calcium: 9.6 mg/dL (ref 8.7–10.3)
Chloride: 94 mmol/L — ABNORMAL LOW (ref 96–106)
Creatinine, Ser: 0.74 mg/dL (ref 0.57–1.00)
Globulin, Total: 2.3 g/dL (ref 1.5–4.5)
Glucose: 136 mg/dL — ABNORMAL HIGH (ref 65–99)
Potassium: 3.5 mmol/L (ref 3.5–5.2)
Sodium: 133 mmol/L — ABNORMAL LOW (ref 134–144)
Total Protein: 6.8 g/dL (ref 6.0–8.5)
eGFR: 86 mL/min/{1.73_m2} (ref >59)

## 2021-04-27 LAB — HEMOGLOBIN A1C
Est. average glucose Bld gHb Est-mCnc: 131 mg/dL
Hgb A1c MFr Bld: 6.2 % — ABNORMAL HIGH (ref 4.8–5.6)

## 2021-05-11 ENCOUNTER — Other Ambulatory Visit: Payer: Self-pay | Admitting: Family Medicine

## 2021-05-11 DIAGNOSIS — E785 Hyperlipidemia, unspecified: Secondary | ICD-10-CM

## 2021-05-11 NOTE — Telephone Encounter (Signed)
Requested medications are due for refill today.  yes  Requested medications are on the active medications list.  yes  Last refill. 05/10/2021  Future visit scheduled.   yes  Notes to clinic.  Prescription is expired.

## 2021-05-13 DIAGNOSIS — S8011XA Contusion of right lower leg, initial encounter: Secondary | ICD-10-CM | POA: Diagnosis not present

## 2021-05-13 DIAGNOSIS — S8012XA Contusion of left lower leg, initial encounter: Secondary | ICD-10-CM | POA: Diagnosis not present

## 2021-05-23 DIAGNOSIS — S8011XD Contusion of right lower leg, subsequent encounter: Secondary | ICD-10-CM | POA: Diagnosis not present

## 2021-05-23 DIAGNOSIS — S8012XD Contusion of left lower leg, subsequent encounter: Secondary | ICD-10-CM | POA: Diagnosis not present

## 2021-06-03 ENCOUNTER — Other Ambulatory Visit: Payer: Self-pay | Admitting: Family Medicine

## 2021-06-03 DIAGNOSIS — F411 Generalized anxiety disorder: Secondary | ICD-10-CM

## 2021-07-14 ENCOUNTER — Other Ambulatory Visit: Payer: Self-pay | Admitting: Family Medicine

## 2021-07-14 NOTE — Telephone Encounter (Signed)
Requested Prescriptions  Pending Prescriptions Disp Refills  . esomeprazole (NEXIUM) 40 MG capsule [Pharmacy Med Name: ESOMEPRAZOLE MAGNESIUM 40MG  DR CAPS] 90 capsule 0    Sig: TAKE 1 CAPSULE BY MOUTH EVERY DAY AT NOON     Gastroenterology: Proton Pump Inhibitors Passed - 07/14/2021  3:42 AM      Passed - Valid encounter within last 12 months    Recent Outpatient Visits          2 months ago Annual physical exam   [facility], MD   8 months ago Overweight   [facility], MD   11 months ago Prediabetes   [facility], MD   1 year ago Annual physical exam   [facility], MD   2 years ago Prediabetes   [facility], MD      Future Appointments            In 3 months Fisher, Malva Limes, MD [facility]

## 2021-07-20 ENCOUNTER — Other Ambulatory Visit: Payer: Self-pay | Admitting: Family Medicine

## 2021-07-20 NOTE — Telephone Encounter (Signed)
Requested medication (s) are due for refill today - unsure  Requested medication (s) are on the active medication list -yes  Future visit scheduled -yes  Last refill: 03/30/21 #30 5RF  Notes to clinic: Request RF: non delegated Rx  Requested Prescriptions  Pending Prescriptions Disp Refills   ALPRAZolam (XANAX) 0.25 MG tablet [Pharmacy Med Name: ALPRAZOLAM 0.25MG  TABLETS] 30 tablet     Sig: TAKE 1 TO 2 TABLETS BY MOUTH EVERY 4 HOURS AS NEEDED FOR ANXIETY. DO NOT EXCEED 4 TABLETS DAILY     Not Delegated - Psychiatry:  Anxiolytics/Hypnotics Failed - 07/20/2021  3:29 PM      Failed - This refill cannot be delegated      Failed - Urine Drug Screen completed in last 360 days      Passed - Valid encounter within last 6 months    Recent Outpatient Visits           2 months ago Annual physical exam   [facility] Malva Limes, MD   8 months ago Overweight   [facility] Malva Limes, MD   11 months ago Prediabetes   [facility] Malva Limes, MD   1 year ago Annual physical exam   [facility] Malva Limes, MD   2 years ago Prediabetes   [facility] Malva Limes, MD       Future Appointments             In 3 months Fisher, Demetrios Isaacs, MD [facility]               Requested Prescriptions  Pending Prescriptions Disp Refills   ALPRAZolam (XANAX) 0.25 MG tablet [Pharmacy Med Name: ALPRAZOLAM 0.25MG  TABLETS] 30 tablet     Sig: TAKE 1 TO 2 TABLETS BY MOUTH EVERY 4 HOURS AS NEEDED FOR ANXIETY. DO NOT EXCEED 4 TABLETS DAILY     Not Delegated - Psychiatry:  Anxiolytics/Hypnotics Failed - 07/20/2021  3:29 PM      Failed - This refill cannot be delegated      Failed - Urine Drug Screen completed in last 360 days      Passed - Valid encounter within last 6 months    Recent Outpatient Visits           2 months ago Annual physical exam   [facility]  Malva Limes, MD   8 months ago Overweight   [facility] Malva Limes, MD   11 months ago Prediabetes   [facility] Malva Limes, MD   1 year ago Annual physical exam   [facility] Malva Limes, MD   2 years ago Prediabetes   [facility] Malva Limes, MD       Future Appointments             In 3 months Fisher, Demetrios Isaacs, MD [facility], [facility]

## 2021-07-25 ENCOUNTER — Other Ambulatory Visit: Payer: Self-pay | Admitting: Family Medicine

## 2021-07-25 DIAGNOSIS — E669 Obesity, unspecified: Secondary | ICD-10-CM

## 2021-07-25 NOTE — Telephone Encounter (Signed)
Requested medication (s) are due for refill today: yes  Requested medication (s) are on the active medication list: yes  Last refill:  01/28/21 #30 with 3 refills  Future visit scheduled: yes  Notes to clinic:  Please review for refill. Refill not delegated per protocol    Requested Prescriptions  Pending Prescriptions Disp Refills   phentermine 15 MG capsule [Pharmacy Med Name: PHENTERMINE HCL 15MG  CAPSULES] 30 capsule     Sig: TAKE 1 CAPSULE(15 MG) BY MOUTH EVERY MORNING     Not Delegated - Gastroenterology:  Antiobesity Agents Failed - 07/25/2021 12:46 PM      Failed - This refill cannot be delegated      Passed - Last BP in normal range    BP Readings from Last 1 Encounters:  04/26/21 119/66          Passed - Last Heart Rate in normal range    Pulse Readings from Last 1 Encounters:  04/26/21 85          Passed - Valid encounter within last 12 months    Recent Outpatient Visits           3 months ago Annual physical exam   [facility], MD   8 months ago Overweight   [facility], MD   11 months ago Prediabetes   [facility], Sherrie Mustache, MD   1 year ago Annual physical exam   [facility], MD   2 years ago Prediabetes   [facility], MD       Future Appointments             In 3 months Fisher, Malva Limes, MD [facility], [facility]

## 2021-07-25 NOTE — Telephone Encounter (Signed)
Medication Refill - Medication: Phentermine   Has the patient contacted their pharmacy? Yes.   Pt states that she would like to have this increased due to this medication not helping as much as it used to. Please advise.  (Agent: If no, request that the patient contact the pharmacy for the refill.) (Agent: If yes, when and what did the pharmacy advise?)  Preferred Pharmacy (with phone number or street name):  [facility][id number] Nicholes Rough, [place] - [address] AT [facility] OF [address]  [address] [place] [address]  Phone: [phone] Fax: [phone]  Hours: Not open 24 hours    Agent: Please be advised that RX refills may take up to 3 business days. We ask that you follow-up with your pharmacy.

## 2021-07-25 NOTE — Telephone Encounter (Signed)
Please see other refill encounter for 07/25/21. Patient also requesting an increase in dosage due to the medication not helping as much as it used to help.

## 2021-07-27 MED ORDER — PHENTERMINE HCL 30 MG PO CAPS: 30.0000 mg | ORAL_CAPSULE | ORAL | 0 refills | Status: DC

## 2021-07-27 NOTE — Telephone Encounter (Signed)
Please call pt to let her know when the new dose is sent to pharmacy / please advise

## 2021-07-27 NOTE — Telephone Encounter (Signed)
Dr. Sherrie Mustache, I have the 30mg  dose of phentermine pending in this encounter. I cant send this mediation in. Will you review the qty and refills, then sign off on this prescription?

## 2021-07-27 NOTE — Telephone Encounter (Signed)
Refill was already sent before I got this message. Has she picked up the 15mg  tablets yet? If not I can send in new prescription for 30mg , otherwise she should take the 15mg  twice a day.

## 2021-07-27 NOTE — Telephone Encounter (Signed)
Have sent prescription for 30mg  capsules. Please schedule follow up visit in 3-4 weeks.

## 2021-07-27 NOTE — Telephone Encounter (Addendum)
I called and spoke with pharmacy. I was advised that patient has not picked up the prescription yet. I advised pharmacy to cancel  the prescription for the 15mg  dose of phentermine.

## 2021-08-14 ENCOUNTER — Other Ambulatory Visit: Payer: Self-pay | Admitting: Family Medicine

## 2021-08-14 DIAGNOSIS — F411 Generalized anxiety disorder: Secondary | ICD-10-CM

## 2021-09-09 ENCOUNTER — Other Ambulatory Visit: Payer: Self-pay

## 2021-09-09 ENCOUNTER — Encounter: Payer: Self-pay | Admitting: Family Medicine

## 2021-09-09 ENCOUNTER — Ambulatory Visit (INDEPENDENT_AMBULATORY_CARE_PROVIDER_SITE_OTHER): Payer: Medicare Other | Admitting: Family Medicine

## 2021-09-09 VITALS — BP 148/76 | HR 82 | Temp 98.3°F | Resp 16 | Ht 63.0 in | Wt 170.5 lb

## 2021-09-09 DIAGNOSIS — Z23 Encounter for immunization: Secondary | ICD-10-CM

## 2021-09-09 DIAGNOSIS — E669 Obesity, unspecified: Secondary | ICD-10-CM

## 2021-09-09 DIAGNOSIS — M810 Age-related osteoporosis without current pathological fracture: Secondary | ICD-10-CM | POA: Diagnosis not present

## 2021-09-09 MED ORDER — PHENTERMINE HCL 30 MG PO CAPS: 30.0000 mg | ORAL_CAPSULE | ORAL | 5 refills | Status: DC

## 2021-09-09 NOTE — Progress Notes (Signed)
Established patient visit   Patient: Gabrielle Salinas   DOB: 03-15-1951   70 y.o. Female  MRN: [id number] Visit Date: 09/09/2021  Today's healthcare provider: [name], MD   Chief Complaint  Patient presents with   Obesity   Subjective    HPI  Follow up for obesity:  Current treatment includes Phentermine. Recent changes made on 07/27/2021 include increasing the dose of phentermine from 15mg  daily to 30mg  daily.  She reports fair compliance with treatment. Has not been taking medication regularly as prescribed. She feels that condition is Unchanged. She is not having side effects.   -----------------------------------------------------------------------------------------     Medications: Outpatient Medications Prior to Visit  Medication Sig   acetaminophen (TYLENOL) 325 MG tablet Take 650 mg by mouth every 4 (four) hours as needed.   alendronate (FOSAMAX) 70 MG tablet TAKE 1 TABLET BY MOUTH EVERY WEEK WITH A FULL GLASS OF WATER AND ON AN EMPTY STOMACH   ALPRAZolam (XANAX) 0.25 MG tablet TAKE 1 TO 2 TABLETS BY MOUTH EVERY 4 HOURS AS NEEDED FOR ANXIETY. DO NOT EXCEED 4 TABLETS DAILY   Ascorbic Acid (VITAMIN C) 100 MG tablet Take 100 mg by mouth daily.   azelastine (ASTELIN) 0.1 % nasal spray Place 2 sprays into both nostrils 2 (two) times daily. Use in each nostril as directed   Calcium Carb-Cholecalciferol 600-500 MG-UNIT CAPS Take 600 Units by mouth daily.   cholecalciferol (VITAMIN D) 1000 units tablet Take 1 tablet by mouth daily.   clorazepate (TRANXENE) 15 MG tablet TAKE 1/2 TABLET BY MOUTH THREE TIMES DAILY AS NEEDED   esomeprazole (NEXIUM) 40 MG capsule TAKE 1 CAPSULE BY MOUTH EVERY DAY AT NOON   ezetimibe (ZETIA) 10 MG tablet TAKE 1 TABLET(10 MG) BY MOUTH DAILY   FLUoxetine (PROZAC) 20 MG capsule TAKE 1 CAPSULE(20 MG) BY MOUTH DAILY   hydrochlorothiazide (HYDRODIURIL) 12.5 MG tablet TAKE 1 TABLET(12.5 MG) BY MOUTH DAILY   lisinopril (ZESTRIL) 10 MG  tablet TAKE 1 TABLET(10 MG) BY MOUTH DAILY   Misc Natural Products (JOINT SUPPORT PO) Take by mouth daily.    montelukast (SINGULAIR) 10 MG tablet TAKE 1 TABLET BY MOUTH EVERY DAY FOR ALLERGIES   Multiple Vitamins-Minerals (MULTIVITAMIN ADULT PO) Take 1 tablet by mouth daily.   Multiple Vitamins-Minerals (PRESERVISION AREDS 2 PO) Take 1 tablet by mouth 2 (two) times daily.   Omega-3 Fatty Acids (FISH OIL) 1000 MG CAPS Take 1 capsule by mouth daily.    phentermine 30 MG capsule Take 1 capsule (30 mg total) by mouth every morning.   triamcinolone cream (KENALOG) 0.5 % Apply 1 application topically daily as needed.   TURMERIC PO Take by mouth daily.    vitamin E 1000 UNIT capsule Take 1,000 Units by mouth daily.    topiramate (TOPAMAX) 50 MG tablet Take 2 tablets (100 mg total) by mouth daily. (Patient not taking: Reported on 09/09/2021)   No facility-administered medications prior to visit.    Review of Systems  Constitutional:  Negative for appetite change, chills, fatigue and fever.  Respiratory:  Negative for chest tightness and shortness of breath.   Cardiovascular:  Negative for chest pain and palpitations.  Gastrointestinal:  Negative for abdominal pain, nausea and vomiting.  Neurological:  Negative for dizziness and weakness.      Objective    BP (!) 148/76 (BP Location: Left Arm, Patient Position: Sitting, Cuff Size: Large)   Pulse 82   Temp 98.3 F (36.8 C) (Oral)  Resp 16   Ht 5\' 3"  (1.6 m)   Wt 170 lb 8 oz (77.3 kg)   LMP 11/27/1980   SpO2 99% Comment: room air  BMI 30.20 kg/m  {Show previous vital signs (optional):23777}  Physical Exam   General appearance: Mildly obese female, cooperative and in no acute distress Head: Normocephalic, without obvious abnormality, atraumatic Respiratory: Respirations even and unlabored, normal respiratory rate Extremities: All extremities are intact.  Skin: Skin color, texture, turgor normal. No rashes seen  Psych: Appropriate  mood and affect. Neurologic: Mental status: Alert, oriented to person, place, and time, thought content appropriate.    Assessment & Plan     1. Class 1 obesity with body mass index (BMI)   She continue taking phentermine intermittenly which she feels continue to be effective.  - phentermine 30 MG capsule; Take 1 capsule (30 mg total) by mouth every morning.  Dispense: 30 capsule; Refill: 5  2. Osteoporosis, unspecified osteoporosis type, unspecified pathological fracture presence Doing well and compliant with alendronate once a week. Will set her up for BMD in December.   3. Need for influenza vaccination  - Flu Vaccine QUAD High Dose IM January)   Future Appointments  Date Time Provider Department Center  10/31/2021  3:00 PM [facility] HEALTH ADVISOR [facility]  04/28/2022  9:00 AM 06/28/2022 Sherrie Mustache, MD [facility]         The entirety of the information documented in the History of Present Illness, Review of Systems and Physical Exam were personally obtained by me. Portions of this information were initially documented by the CMA and reviewed by me for thoroughness and accuracy.     Demetrios Isaacs, MD  [facility] [phone] (phone) [phone] (fax)  [facility]

## 2021-09-09 NOTE — Patient Instructions (Signed)
Please review the attached list of medications and notify my office if there are any errors.  ? ?Please call the [facility] ([phone]) to schedule a routine screening mammogram. ? ?

## 2021-09-12 ENCOUNTER — Telehealth: Payer: Self-pay | Admitting: Family Medicine

## 2021-09-12 DIAGNOSIS — Z6833 Body mass index (BMI) 33.0-33.9, adult: Secondary | ICD-10-CM

## 2021-09-12 DIAGNOSIS — E669 Obesity, unspecified: Secondary | ICD-10-CM

## 2021-09-12 NOTE — Telephone Encounter (Signed)
Copied from CRM [id number]. Topic: Quick Communication - Rx Refill/Question >> Sep 12, 2021  2:18 PM Izora Ribas, [place] A wrote: Medication: topiramate (TOPAMAX) 50 MG tablet [[id number]]  DISCONTINUED  Has the patient contacted their pharmacy? No. - The patient has had a miscommunication with their doctor and would like to continue taking the medication  (Agent: If no, request that the patient contact the pharmacy for the refill.) (Agent: If yes, when and what did the pharmacy advise?)  Preferred Pharmacy (with phone number or street name): [facility] #[id number] Nicholes Rough, [place] - [address] AT [facility] [address]  Phone:  [phone] Fax:  [phone]   Has the patient been seen for an appointment in the last year OR does the patient have an upcoming appointment? Yes.    Agent: Please be advised that RX refills may take up to 3 business days. We ask that you follow-up with your pharmacy.

## 2021-09-13 MED ORDER — TOPIRAMATE 50 MG PO TABS: 50.0000 mg | ORAL_TABLET | Freq: Two times a day (BID) | ORAL | 1 refills | Status: DC

## 2021-09-13 NOTE — Telephone Encounter (Signed)
LOV: 09/09/2021 NOV: 04/28/2022    Last Refill: 04/22/2021 #180 1 Refill   Thanks,   Vernona Rieger

## 2021-09-28 ENCOUNTER — Other Ambulatory Visit: Payer: Self-pay | Admitting: Family Medicine

## 2021-09-28 DIAGNOSIS — E669 Obesity, unspecified: Secondary | ICD-10-CM

## 2021-09-28 NOTE — Telephone Encounter (Signed)
LOV: 09/09/2021  NOV: 04/28/2022

## 2021-09-28 NOTE — Telephone Encounter (Signed)
Requested medication (s) are due for refill today: No  Requested medication (s) are on the active medication list: Yes  Last refill:  09/09/21  Future visit scheduled: Yes  Notes to clinic:  Filled 09/09/21 # 30 with 5 refills.    Requested Prescriptions  Pending Prescriptions Disp Refills   phentermine 30 MG capsule [Pharmacy Med Name: PHENTERMINE HCL 30MG  CAPSULES] 30 capsule     Sig: TAKE 1 CAPSULE(30 MG) BY MOUTH EVERY MORNING     Not Delegated - Gastroenterology:  Antiobesity Agents Failed - 09/28/2021  8:30 AM      Failed - This refill cannot be delegated      Failed - Last BP in normal range    BP Readings from Last 1 Encounters:  09/09/21 (!) 148/76          Passed - Last Heart Rate in normal range    Pulse Readings from Last 1 Encounters:  09/09/21 82          Passed - Valid encounter within last 12 months    Recent Outpatient Visits           2 weeks ago Obesity (BMI 30-39.9)   [facility], Sherrie Mustache, MD   5 months ago Annual physical exam   [facility], MD   10 months ago Overweight   [facility], MD   1 year ago Prediabetes   [facility], MD   1 year ago Annual physical exam   [facility], MD

## 2021-09-28 NOTE — Telephone Encounter (Signed)
Requested Prescriptions  Pending Prescriptions Disp Refills  . montelukast (SINGULAIR) 10 MG tablet [Pharmacy Med Name: MONTELUKAST 10MG  TABLETS] 90 tablet 2    Sig: TAKE 1 TABLET BY MOUTH EVERY DAY FOR ALLERGIES     Pulmonology:  Leukotriene Inhibitors Passed - 09/28/2021  3:43 AM      Passed - Valid encounter within last 12 months    Recent Outpatient Visits          2 weeks ago Obesity (BMI 30-39.9)   [facility], Sherrie Mustache, MD   5 months ago Annual physical exam   [facility], MD   10 months ago Overweight   [facility], MD   1 year ago Prediabetes   [facility], MD   1 year ago Annual physical exam   [facility], MD

## 2021-10-11 ENCOUNTER — Other Ambulatory Visit: Payer: Self-pay | Admitting: Family Medicine

## 2021-10-11 ENCOUNTER — Ambulatory Visit: Payer: Self-pay

## 2021-10-11 DIAGNOSIS — U071 COVID-19: Secondary | ICD-10-CM

## 2021-10-11 MED ORDER — MOLNUPIRAVIR EUA 200MG CAPSULE: 4.0000 | ORAL_CAPSULE | Freq: Two times a day (BID) | ORAL | 0 refills | Status: AC

## 2021-10-11 NOTE — Telephone Encounter (Signed)
Pt called stating that she had her booster shot on 10/08/21 and that she tested positive for covid today 10/11/21. She states that she is having body aches. She states that she is looking for care advice. Please advise.  Pt. Tested positive for COVID 19 last night - home test. Has body aches, runny nose, headache, mild cough. Not sure if she should take an anti-viral medication, "but I don't want to end up in the hospital." Please advise pt. Practice currently closed for lunch.   Answer Assessment - Initial Assessment Questions 1. COVID-19 DIAGNOSIS: "Who made your COVID-19 diagnosis?" "Was it confirmed by a positive lab test or self-test?" If not diagnosed by a doctor (or NP/PA), ask "Are there lots of cases (community spread) where you live?" Note: See public health department website, if unsure.     Home 2. COVID-19 EXPOSURE: "Was there any known exposure to COVID before the symptoms began?" CDC Definition of close contact: within 6 feet (2 meters) for a total of 15 minutes or more over a 24-hour period.      No 3. ONSET: "When did the COVID-19 symptoms start?"      Last night 4. WORST SYMPTOM: "What is your worst symptom?" (e.g., cough, fever, shortness of breath, muscle aches)     Body aches 5. COUGH: "Do you have a cough?" If Yes, ask: "How bad is the cough?"       Mild 6. FEVER: "Do you have a fever?" If Yes, ask: "What is your temperature, how was it measured, and when did it start?"     No 7. RESPIRATORY STATUS: "Describe your breathing?" (e.g., shortness of breath, wheezing, unable to speak)      No 8. BETTER-SAME-WORSE: "Are you getting better, staying the same or getting worse compared to yesterday?"  If getting worse, ask, "In what way?"     Worse 9. HIGH RISK DISEASE: "Do you have any chronic medical problems?" (e.g., asthma, heart or lung disease, weak immune system, obesity, etc.)     No 10. VACCINE: "Have you had the COVID-19 vaccine?" If Yes, ask: "Which one, how many shots,  when did you get it?"       Yes 11. BOOSTER: "Have you received your COVID-19 booster?" If Yes, ask: "Which one and when did you get it?"       Yes 12. PREGNANCY: "Is there any chance you are pregnant?" "When was your last menstrual period?"       No 13. OTHER SYMPTOMS: "Do you have any other symptoms?"  (e.g., chills, fatigue, headache, loss of smell or taste, muscle pain, sore throat)       Fatigue, headache, runny nose 14. O2 SATURATION MONITOR:  "Do you use an oxygen saturation monitor (pulse oximeter) at home?" If Yes, ask "What is your reading (oxygen level) today?" "What is your usual oxygen saturation reading?" (e.g., 95%)       No  Protocols used: Coronavirus (COVID-19) Diagnosed or Suspected-A-AH

## 2021-10-11 NOTE — Telephone Encounter (Signed)
Rx was sent to pharmacy. Patient was advised.  

## 2021-10-11 NOTE — Telephone Encounter (Signed)
Recommend molnupiravir. Can send prescription to pharmacy of her choice if she likes.

## 2021-10-11 NOTE — Telephone Encounter (Signed)
Requested medications are due for refill today.  yes  Requested medications are on the active medications list.  yes  Last refill. 07/21/2021  Future visit scheduled.   Yes 04/2022  Notes to clinic.  Medication not delegated.

## 2021-10-19 ENCOUNTER — Other Ambulatory Visit: Payer: Self-pay | Admitting: Family Medicine

## 2021-10-19 NOTE — Telephone Encounter (Signed)
Requested Prescriptions  Pending Prescriptions Disp Refills  . esomeprazole (NEXIUM) 40 MG capsule [Pharmacy Med Name: ESOMEPRAZOLE MAG 40MG  DR  CAPS] 90 capsule 0    Sig: TAKE 1 CAPSULE BY MOUTH EVERY DAY AT NOON     Gastroenterology: Proton Pump Inhibitors Passed - 10/19/2021  3:40 AM      Passed - Valid encounter within last 12 months    Recent Outpatient Visits          1 month ago Obesity (BMI 30-39.9)   [facility], Sherrie Mustache, MD   5 months ago Annual physical exam   [facility], MD   11 months ago Overweight   [facility], MD   1 year ago Prediabetes   [facility], MD   1 year ago Annual physical exam   [facility], MD

## 2021-10-25 ENCOUNTER — Other Ambulatory Visit: Payer: Self-pay | Admitting: Family Medicine

## 2021-10-25 DIAGNOSIS — E2839 Other primary ovarian failure: Secondary | ICD-10-CM

## 2021-10-28 ENCOUNTER — Ambulatory Visit: Payer: Medicare Other | Admitting: Family Medicine

## 2021-10-31 ENCOUNTER — Ambulatory Visit (INDEPENDENT_AMBULATORY_CARE_PROVIDER_SITE_OTHER): Payer: Medicare Other

## 2021-10-31 ENCOUNTER — Other Ambulatory Visit: Payer: Self-pay

## 2021-10-31 VITALS — BP 132/82 | HR 86 | Temp 97.5°F | Ht 63.0 in | Wt 171.6 lb

## 2021-10-31 DIAGNOSIS — Z1231 Encounter for screening mammogram for malignant neoplasm of breast: Secondary | ICD-10-CM | POA: Diagnosis not present

## 2021-10-31 NOTE — Patient Instructions (Addendum)
Gabrielle Salinas , Thank you for taking time to come for your Medicare Wellness Visit. I appreciate your ongoing commitment to your health goals. Please review the following plan we discussed and let me know if I can assist you in the future.   Screening recommendations/referrals: Colonoscopy: 11/19/17 Mammogram: 01/05/20, referral made Bone Density: 10/29/18 Recommended yearly ophthalmology/optometry visit for glaucoma screening and checkup Recommended yearly dental visit for hygiene and checkup  Vaccinations: Influenza vaccine: 09/09/21 Pneumococcal vaccine: 03/14/16 Tdap vaccine: 04/28/21 Shingles vaccine: 08/22/19, 10/30/19   Covid-19:01/09/20, 02/02/20, 08/28/20  Advanced directives: declined  Conditions/risks identified:   Next appointment: Follow up in one year for your annual wellness visit    Preventive Care 65 Years and Older, Female Preventive care refers to lifestyle choices and visits with your health care provider that can promote health and wellness. What does preventive care include? A yearly physical exam. This is also called an annual well check. Dental exams once or twice a year. Routine eye exams. Ask your health care provider how often you should have your eyes checked. Personal lifestyle choices, including: Daily care of your teeth and gums. Regular physical activity. Eating a healthy diet. Avoiding tobacco and drug use. Limiting alcohol use. Practicing safe sex. Taking low-dose aspirin every day. Taking vitamin and mineral supplements as recommended by your health care provider. What happens during an annual well check? The services and screenings done by your health care provider during your annual well check will depend on your age, overall health, lifestyle risk factors, and family history of disease. Counseling  Your health care provider may ask you questions about your: Alcohol use. Tobacco use. Drug use. Emotional well-being. Home and relationship  well-being. Sexual activity. Eating habits. History of falls. Memory and ability to understand (cognition). Work and work Astronomer. Reproductive health. Screening  You may have the following tests or measurements: Height, weight, and BMI. Blood pressure. Lipid and cholesterol levels. These may be checked every 5 years, or more frequently if you are over 8 years old. Skin check. Lung cancer screening. You may have this screening every year starting at age 33 if you have a 30-pack-year history of smoking and currently smoke or have quit within the past 15 years. Fecal occult blood test (FOBT) of the stool. You may have this test every year starting at age 65. Flexible sigmoidoscopy or colonoscopy. You may have a sigmoidoscopy every 5 years or a colonoscopy every 10 years starting at age 52. Hepatitis C blood test. Hepatitis B blood test. Sexually transmitted disease (STD) testing. Diabetes screening. This is done by checking your blood sugar (glucose) after you have not eaten for a while (fasting). You may have this done every 1-3 years. Bone density scan. This is done to screen for osteoporosis. You may have this done starting at age 17. Mammogram. This may be done every 1-2 years. Talk to your health care provider about how often you should have regular mammograms. Talk with your health care provider about your test results, treatment options, and if necessary, the need for more tests. Vaccines  Your health care provider may recommend certain vaccines, such as: Influenza vaccine. This is recommended every year. Tetanus, diphtheria, and acellular pertussis (Tdap, Td) vaccine. You may need a Td booster every 10 years. Zoster vaccine. You may need this after age 47. Pneumococcal 13-valent conjugate (PCV13) vaccine. One dose is recommended after age 34. Pneumococcal polysaccharide (PPSV23) vaccine. One dose is recommended after age 80. Talk to your health care provider about  which  screenings and vaccines you need and how often you need them. This information is not intended to replace advice given to you by your health care provider. Make sure you discuss any questions you have with your health care provider. Document Released: 12/10/2015 Document Revised: 08/02/2016 Document Reviewed: 09/14/2015 Elsevier Interactive Patient Education  2017 [place] Prevention in the Home Falls can cause injuries. They can happen to people of all ages. There are many things you can do to make your home safe and to help prevent falls. What can I do on the outside of my home? Regularly fix the edges of walkways and driveways and fix any cracks. Remove anything that might make you trip as you walk through a door, such as a raised step or threshold. Trim any bushes or trees on the path to your home. Use bright outdoor lighting. Clear any walking paths of anything that might make someone trip, such as rocks or tools. Regularly check to see if handrails are loose or broken. Make sure that both sides of any steps have handrails. Any raised decks and porches should have guardrails on the edges. Have any leaves, snow, or ice cleared regularly. Use sand or salt on walking paths during winter. Clean up any spills in your garage right away. This includes oil or grease spills. What can I do in the bathroom? Use night lights. Install grab bars by the toilet and in the tub and shower. Do not use towel bars as grab bars. Use non-skid mats or decals in the tub or shower. If you need to sit down in the shower, use a plastic, non-slip stool. Keep the floor dry. Clean up any water that spills on the floor as soon as it happens. Remove soap buildup in the tub or shower regularly. Attach bath mats securely with double-sided non-slip rug tape. Do not have throw rugs and other things on the floor that can make you trip. What can I do in the bedroom? Use night lights. Make sure that you have a  light by your bed that is easy to reach. Do not use any sheets or blankets that are too big for your bed. They should not hang down onto the floor. Have a firm chair that has side arms. You can use this for support while you get dressed. Do not have throw rugs and other things on the floor that can make you trip. What can I do in the kitchen? Clean up any spills right away. Avoid walking on wet floors. Keep items that you use a lot in easy-to-reach places. If you need to reach something above you, use a strong step stool that has a grab bar. Keep electrical cords out of the way. Do not use floor polish or wax that makes floors slippery. If you must use wax, use non-skid floor wax. Do not have throw rugs and other things on the floor that can make you trip. What can I do with my stairs? Do not leave any items on the stairs. Make sure that there are handrails on both sides of the stairs and use them. Fix handrails that are broken or loose. Make sure that handrails are as long as the stairways. Check any carpeting to make sure that it is firmly attached to the stairs. Fix any carpet that is loose or worn. Avoid having throw rugs at the top or bottom of the stairs. If you do have throw rugs, attach them to the floor with  carpet tape. Make sure that you have a light switch at the top of the stairs and the bottom of the stairs. If you do not have them, ask someone to add them for you. What else can I do to help prevent falls? Wear shoes that: Do not have high heels. Have rubber bottoms. Are comfortable and fit you well. Are closed at the toe. Do not wear sandals. If you use a stepladder: Make sure that it is fully opened. Do not climb a closed stepladder. Make sure that both sides of the stepladder are locked into place. Ask someone to hold it for you, if possible. Clearly mark and make sure that you can see: Any grab bars or handrails. First and last steps. Where the edge of each step  is. Use tools that help you move around (mobility aids) if they are needed. These include: Canes. Walkers. Scooters. Crutches. Turn on the lights when you go into a dark area. Replace any light bulbs as soon as they burn out. Set up your furniture so you have a clear path. Avoid moving your furniture around. If any of your floors are uneven, fix them. If there are any pets around you, be aware of where they are. Review your medicines with your doctor. Some medicines can make you feel dizzy. This can increase your chance of falling. Ask your doctor what other things that you can do to help prevent falls. This information is not intended to replace advice given to you by your health care provider. Make sure you discuss any questions you have with your health care provider. Document Released: 09/09/2009 Document Revised: 04/20/2016 Document Reviewed: 12/18/2014 Elsevier Interactive Patient Education  2017 [employer].

## 2021-10-31 NOTE — Progress Notes (Signed)
Subjective:   Gabrielle Salinas is a 70 y.o. female who presents for Medicare Annual (Subsequent) preventive examination.  Review of Systems         Objective:    Today's Vitals   10/31/21 1458  BP: 132/82  Pulse: 86  Temp: (!) 97.5 F (36.4 C)  TempSrc: Oral  SpO2: 98%  Weight: 171 lb 9.6 oz (77.8 kg)  Height:  (1.6 m)   Body mass index is 30.4 kg/m.  Advanced Directives 10/31/2021 10/26/2020 10/02/2019 09/30/2018 09/28/2017  Does Patient Have a Medical Advance Directive? No Yes Yes No No  Type of Advance Directive - Healthcare Power of [place];Living will Healthcare Power of [place];Living will - -  Copy of Healthcare Power of Attorney in Chart? - No - copy requested No - copy requested - -  Would patient like information on creating a medical advance directive? No - Patient declined - - Yes (MAU/Ambulatory/Procedural Areas - Information given) (No Data)    Current Medications (verified) Outpatient Encounter Medications as of 10/31/2021  Medication Sig   acetaminophen (TYLENOL) 325 MG tablet Take 650 mg by mouth every 4 (four) hours as needed.   alendronate (FOSAMAX) 70 MG tablet TAKE 1 TABLET BY MOUTH EVERY WEEK WITH A FULL GLASS OF WATER AND ON AN EMPTY STOMACH   ALPRAZolam (XANAX) 0.25 MG tablet TAKE 1 TO 2 TABLETS BY MOUTH EVERY 4 HOURS AS NEEDED FOR ANXIETY. DO NOT EXCEED 4 TABLETS DAILY   Ascorbic Acid (VITAMIN C) 100 MG tablet Take 100 mg by mouth daily.   azelastine (ASTELIN) 0.1 % nasal spray Place 2 sprays into both nostrils 2 (two) times daily. Use in each nostril as directed   Calcium Carb-Cholecalciferol 600-500 MG-UNIT CAPS Take 600 Units by mouth daily.   clorazepate (TRANXENE) 15 MG tablet TAKE 1/2 TABLET BY MOUTH THREE TIMES DAILY AS NEEDED   diazepam (VALIUM) 10 MG tablet Take 10 mg by mouth daily as needed.   esomeprazole (NEXIUM) 40 MG capsule TAKE 1 CAPSULE BY MOUTH EVERY DAY AT NOON   ezetimibe (ZETIA) 10 MG tablet TAKE 1 TABLET(10 MG) BY MOUTH  DAILY   FLUoxetine (PROZAC) 20 MG capsule TAKE 1 CAPSULE(20 MG) BY MOUTH DAILY   hydrochlorothiazide (HYDRODIURIL) 12.5 MG tablet TAKE 1 TABLET(12.5 MG) BY MOUTH DAILY   lisinopril (ZESTRIL) 10 MG tablet TAKE 1 TABLET(10 MG) BY MOUTH DAILY   Misc Natural Products (JOINT SUPPORT PO) Take by mouth daily.    montelukast (SINGULAIR) 10 MG tablet TAKE 1 TABLET BY MOUTH EVERY DAY FOR ALLERGIES   Multiple Vitamins-Minerals (MULTIVITAMIN ADULT PO) Take 1 tablet by mouth daily.   Multiple Vitamins-Minerals (PRESERVISION AREDS 2 PO) Take 1 tablet by mouth 2 (two) times daily.   Omega-3 Fatty Acids (FISH OIL) 1000 MG CAPS Take 1 capsule by mouth daily.    phentermine 30 MG capsule TAKE 1 CAPSULE(30 MG) BY MOUTH EVERY MORNING   topiramate (TOPAMAX) 50 MG tablet Take 1 tablet (50 mg total) by mouth 2 (two) times daily.   triamcinolone cream (KENALOG) 0.5 % Apply 1 application topically daily as needed.   TURMERIC PO Take by mouth daily.    vitamin E 1000 UNIT capsule Take 1,000 Units by mouth daily.    cholecalciferol (VITAMIN D) 1000 units tablet Take 1 tablet by mouth daily. (Patient not taking: Reported on 10/31/2021)   ibuprofen (ADVIL) 600 MG tablet Take 600 mg by mouth every 6 (six) hours as needed. (Patient not taking: Reported on 10/31/2021)  No facility-administered encounter medications on file as of 10/31/2021.    Allergies (verified) Bactrim [sulfamethoxazole-trimethoprim], Bee venom, Daypro  [oxaprozin], Sulfa antibiotics, and Sulfasalazine   History: Past Medical History:  Diagnosis Date   Allergy    Anxiety    Cataract    Depression    GERD (gastroesophageal reflux disease)    Hyperlipidemia    Hypertension    Osteoporosis    Past Surgical History:  Procedure Laterality Date   BREAST BIOPSY Right <10 yrs ago   x2 benign results   CATARACT EXTRACTION, BILATERAL  09/2019   COLONOSCOPY  11/19/2017   GI Bleed  2008   RHINOPLASTY     UPPER GASTROINTESTINAL ENDOSCOPY  10/2017    [facility]   VAGINAL HYSTERECTOMY  1982   due to abnormal pap smears and menorrhagia   Family History  Problem Relation Age of Onset   Lupus Mother    Heart attack Father    Diabetes Father    Diabetes Sister    Diabetes Sister    Diabetes Sister    Breast cancer Neg Hx    Social History   Socioeconomic History   Marital status: Widowed    Spouse name: Not on file   Number of children: 3   Years of education: Not on file   Highest education level: Associate degree: academic program  Occupational History   Occupation: retired    Comment: keeps granddaughter  Tobacco Use   Smoking status: Never   Smokeless tobacco: Never  Vaping Use   Vaping Use: Never used  Substance and Sexual Activity   Alcohol use: Yes    Alcohol/week: 0.0 standard drinks    Comment: 1glass of wine on special occasions   Drug use: Never   Sexual activity: Not on file  Other Topics Concern   Not on file  Social History Narrative   Not on file   Social Determinants of Health   Financial Resource Strain: Low Risk    Difficulty of Paying Living Expenses: Not hard at all  Food Insecurity: No Food Insecurity   Worried About Programme researcher, broadcasting/film/video in the Last Year: Never true   Ran Out of Food in the Last Year: Never true  Transportation Needs: No Transportation Needs   Lack of Transportation (Medical): No   Lack of Transportation (Non-Medical): No  Physical Activity: Inactive   Days of Exercise per Week: 0 days   Minutes of Exercise per Session: 0 min  Stress: No Stress Concern Present   Feeling of Stress : Not at all  Social Connections: Moderately Isolated   Frequency of Communication with Friends and Family: More than three times a week   Frequency of Social Gatherings with Friends and Family: Once a week   Attends Religious Services: More than 4 times per year   Active Member of [employer] or Organizations: No   Attends Banker Meetings: Never   Marital Status: Widowed    Tobacco  Counseling Counseling given: Not Answered   Clinical Intake:  Pre-visit preparation completed: Yes  Pain : No/denies pain     Nutritional Risks: None Diabetes: No  How often do you need to have someone help you when you read instructions, pamphlets, or other written materials from your doctor or pharmacy?: 1 - Never  Diabetic?no  Interpreter Needed?: No  Information entered by :: Kennedy Bucker, LPN   Activities of Daily Living In your present state of health, do you have any difficulty performing the following activities:  10/31/2021 09/09/2021  Hearing? N N  Vision? N N  Difficulty concentrating or making decisions? N N  Walking or climbing stairs? N N  Dressing or bathing? N N  Doing errands, shopping? N N  Preparing Food and eating ? N -  Using the Toilet? N -  In the past six months, have you accidently leaked urine? N -  Do you have problems with loss of bowel control? N -  Managing your Medications? N -  Managing your Finances? N -  Housekeeping or managing your Housekeeping? N -  Some recent data might be hidden    Patient Care Team: Malva Limes, MD as PCP - General (Family Medicine) Patty, A. Azucena Kuba, MD as Consulting Physician (Ophthalmology) Juanell Fairly, MD as Referring Physician (Orthopedic Surgery) Elwin Mocha, MD as Consulting Physician (Ophthalmology)  Indicate any recent Medical Services you may have received from other than [facility] providers in the past year (date may be approximate).     Assessment:   This is a routine wellness examination for An.  Hearing/Vision screen Hearing Screening - Comments:: Hearing good Vision Screening - Comments:: Wears glasses- sees [facility] in [place]  Dietary issues and exercise activities discussed: Current Exercise Habits: The patient does not participate in regular exercise at present   Goals Addressed   None    Depression Screen PHQ 2/9 Scores 10/31/2021 09/09/2021 04/26/2021  11/10/2020 10/26/2020 08/03/2020 10/02/2019  PHQ - 2 Score 1 2 0 1 0 2 0  PHQ- 9 Score 1 6 0 7 - 6 -    Fall Risk Fall Risk  10/31/2021 09/09/2021 04/26/2021 10/26/2020 10/02/2019  Falls in the past year? 1 1 0 0 0  Number falls in past yr: 0 0 0 0 0  Comment - - - - -  Injury with Fall? 1 1 0 0 0  Risk for fall due to : History of fall(s) History of fall(s) No Fall Risks - -  Follow up Falls prevention discussed Falls prevention discussed Falls evaluation completed - -    FALL RISK PREVENTION PERTAINING TO THE HOME:  Any stairs in or around the home? Yes  If so, are there any without handrails? No  Home free of loose throw rugs in walkways, pet beds, electrical cords, etc? Yes  Adequate lighting in your home to reduce risk of falls? Yes   ASSISTIVE DEVICES UTILIZED TO PREVENT FALLS:  Life alert? No  Use of a cane, walker or w/c? No  Grab bars in the bathroom? Yes  Shower chair or bench in shower? No  Elevated toilet seat or a handicapped toilet? No   TIMED UP AND GO:  Was the test performed? Yes .  Length of time to ambulate 10 feet: 5 sec.   Gait slow and steady without use of assistive device  Cognitive Function:     6CIT Screen 09/28/2017  What Year? 0 points  What month? 0 points  What time? 0 points  Count back from 20 0 points  Months in reverse 0 points  Repeat phrase 2 points  Total Score 2    Immunizations Immunization History  Administered Date(s) Administered   Fluad Quad(high Dose 65+) 08/03/2020, 09/09/2021   Influenza, High Dose Seasonal PF 09/02/2016, 09/28/2017, 09/30/2018, 08/22/2019   PFIZER(Purple Top)SARS-COV-2 Vaccination 01/09/2020, 02/02/2020, 09/15/2020   Pneumococcal Conjugate-13 07/30/2014   Pneumococcal Polysaccharide-23 10/22/1998, 03/14/2016   Td 07/23/1995   Tdap 09/29/2011, 04/28/2021   Zoster Recombinat (Shingrix) 08/22/2019, 10/30/2019   Zoster, Live 07/30/2014  TDAP status: Up to date  Flu Vaccine status: Up to  date  Pneumococcal vaccine status: Up to date  Covid-19 vaccine status: Completed vaccines  Qualifies for Shingles Vaccine? Yes   Zostavax completed Yes   Shingrix Completed?: Yes  Screening Tests Health Maintenance  Topic Date Due   COVID-19 Vaccine (4 - Booster for Pfizer series) 11/10/2020   DEXA SCAN  10/29/2021   MAMMOGRAM  01/04/2022   Fecal DNA (Cologuard)  05/12/2023   TETANUS/TDAP  04/29/2031   Pneumonia Vaccine 10+ Years old  Completed   INFLUENZA VACCINE  Completed   Hepatitis C Screening  Completed   Zoster Vaccines- Shingrix  Completed   HPV VACCINES  Aged Out   COLONOSCOPY (Pts 45-41yrs Insurance coverage will need to be confirmed)  Discontinued    Health Maintenance  Health Maintenance Due  Topic Date Due   COVID-19 Vaccine (4 - Booster for Pfizer series) 11/10/2020   DEXA SCAN  10/29/2021    Colorectal cancer screening: Type of screening: Colonoscopy. Completed 11/19/17. Repeat every 10 years  Mammogram status: Completed 01/05/20. Repeat every year  Bone Density status: Completed 10/29/18. Results reflect: Bone density results: OSTEOPENIA. Repeat every 5 years.  Lung Cancer Screening: (Low Dose CT Chest recommended if Age 24-80 years, 30 pack-year currently smoking OR have quit w/in 15years.) does not qualify.    Additional Screening:  Hepatitis C Screening: does qualify; Completed 07/30/14  Vision Screening: Recommended annual ophthalmology exams for early detection of glaucoma and other disorders of the eye. Is the patient up to date with their annual eye exam?  Yes  Who is the provider or what is the name of the office in which the patient attends annual eye exams? [facility]  Dental Screening: Recommended annual dental exams for proper oral hygiene  Community Resource Referral / Chronic Care Management: CRR required this visit?  No   CCM required this visit?  No      Plan:     I have personally reviewed and noted the  following in the patient's chart:   Medical and social history Use of alcohol, tobacco or illicit drugs  Current medications and supplements including opioid prescriptions.  Functional ability and status Nutritional status Physical activity Advanced directives List of other physicians Hospitalizations, surgeries, and ER visits in previous 12 months Vitals Screenings to include cognitive, depression, and falls Referrals and appointments  In addition, I have reviewed and discussed with patient certain preventive protocols, quality metrics, and best practice recommendations. A written personalized care plan for preventive services as well as general preventive health recommendations were provided to patient.     Hal Hope, LPN   01/03/3784   Nurse Notes: none

## 2021-11-01 ENCOUNTER — Other Ambulatory Visit: Payer: Self-pay | Admitting: Family Medicine

## 2021-11-01 DIAGNOSIS — F411 Generalized anxiety disorder: Secondary | ICD-10-CM

## 2021-11-01 NOTE — Telephone Encounter (Signed)
Spoke with pharmacist. Did not have any refills. Med refilled #[phone]

## 2021-11-04 ENCOUNTER — Telehealth (INDEPENDENT_AMBULATORY_CARE_PROVIDER_SITE_OTHER): Payer: Medicare Other | Admitting: Physician Assistant

## 2021-11-04 ENCOUNTER — Ambulatory Visit: Payer: Self-pay | Admitting: *Deleted

## 2021-11-04 ENCOUNTER — Encounter: Payer: Self-pay | Admitting: Physician Assistant

## 2021-11-04 DIAGNOSIS — J069 Acute upper respiratory infection, unspecified: Secondary | ICD-10-CM | POA: Diagnosis not present

## 2021-11-04 NOTE — Progress Notes (Deleted)
MyChart Video Visit    Virtual Visit via Video Note   This visit type was conducted due to national recommendations for restrictions regarding the COVID-19 Pandemic (e.g. social distancing) in an effort to limit this patient's exposure and mitigate transmission in our community. This patient is at least at moderate risk for complications without adequate follow up. This format is felt to be most appropriate for this patient at this time. Physical exam was limited by quality of the video and audio technology used for the visit.   Patient location: *** Provider location: ***  I discussed the limitations of evaluation and management by telemedicine and the availability of in person appointments. The patient expressed understanding and agreed to proceed.  Patient: Gabrielle Salinas   DOB: [birth year]   70 y.o. Female  MRN: [id number] Visit Date: 11/04/2021  Today's healthcare provider: Oswaldo Conroy Gabrielle Teschner, PA-C   No chief complaint on file.  Subjective    HPI  ***   Medications: Outpatient Medications Prior to Visit  Medication Sig   acetaminophen (TYLENOL) 325 MG tablet Take 650 mg by mouth every 4 (four) hours as needed.   alendronate (FOSAMAX) 70 MG tablet TAKE 1 TABLET BY MOUTH EVERY WEEK WITH A FULL GLASS OF WATER AND ON AN EMPTY STOMACH   ALPRAZolam (XANAX) 0.25 MG tablet TAKE 1 TO 2 TABLETS BY MOUTH EVERY 4 HOURS AS NEEDED FOR ANXIETY. DO NOT EXCEED 4 TABLETS DAILY   Ascorbic Acid (VITAMIN C) 100 MG tablet Take 100 mg by mouth daily.   azelastine (ASTELIN) 0.1 % nasal spray Place 2 sprays into both nostrils 2 (two) times daily. Use in each nostril as directed   Calcium Carb-Cholecalciferol 600-500 MG-UNIT CAPS Take 600 Units by mouth daily.   cholecalciferol (VITAMIN D) 1000 units tablet Take 1 tablet by mouth daily. (Patient not taking: Reported on 10/31/2021)   clorazepate (TRANXENE) 15 MG tablet TAKE 1/2 TABLET BY MOUTH THREE TIMES DAILY AS NEEDED   diazepam (VALIUM) 10 MG  tablet Take 10 mg by mouth daily as needed.   esomeprazole (NEXIUM) 40 MG capsule TAKE 1 CAPSULE BY MOUTH EVERY DAY AT NOON   ezetimibe (ZETIA) 10 MG tablet TAKE 1 TABLET(10 MG) BY MOUTH DAILY   FLUoxetine (PROZAC) 20 MG capsule TAKE 1 CAPSULE(20 MG) BY MOUTH DAILY   hydrochlorothiazide (HYDRODIURIL) 12.5 MG tablet TAKE 1 TABLET(12.5 MG) BY MOUTH DAILY   ibuprofen (ADVIL) 600 MG tablet Take 600 mg by mouth every 6 (six) hours as needed. (Patient not taking: Reported on 10/31/2021)   lisinopril (ZESTRIL) 10 MG tablet TAKE 1 TABLET(10 MG) BY MOUTH DAILY   Misc Natural Products (JOINT SUPPORT PO) Take by mouth daily.    montelukast (SINGULAIR) 10 MG tablet TAKE 1 TABLET BY MOUTH EVERY DAY FOR ALLERGIES   Multiple Vitamins-Minerals (MULTIVITAMIN ADULT PO) Take 1 tablet by mouth daily.   Multiple Vitamins-Minerals (PRESERVISION AREDS 2 PO) Take 1 tablet by mouth 2 (two) times daily.   Omega-3 Fatty Acids (FISH OIL) 1000 MG CAPS Take 1 capsule by mouth daily.    phentermine 30 MG capsule TAKE 1 CAPSULE(30 MG) BY MOUTH EVERY MORNING   topiramate (TOPAMAX) 50 MG tablet Take 1 tablet (50 mg total) by mouth 2 (two) times daily.   triamcinolone cream (KENALOG) 0.5 % Apply 1 application topically daily as needed.   TURMERIC PO Take by mouth daily.    vitamin E 1000 UNIT capsule Take 1,000 Units by mouth daily.    No facility-administered  medications prior to visit.    Review of Systems  {Labs  Heme  Chem  Endocrine  Serology  Results Review (optional):23779}  Objective    LMP 11/27/1980  {Show previous vital signs (optional):23777}  Physical Exam     Assessment & Plan     ***  No follow-ups on file.     I discussed the assessment and treatment plan with the patient. The patient was provided an opportunity to ask questions and all were answered. The patient agreed with the plan and demonstrated an understanding of the instructions.   The patient was advised to call back or seek an  in-person evaluation if the symptoms worsen or if the condition fails to improve as anticipated.  I provided *** minutes of non-face-to-face time during this encounter.  {provider attestation***:1}  [facility] [phone] (phone) [phone] (fax)  [facility]

## 2021-11-04 NOTE — Progress Notes (Signed)
Virtual Visit via Telephone Note  I connected with Gabrielle Salinas on 11/04/21 at  2:40 PM EST by telephone and verified that I am speaking with the correct person using two identifiers.  Location: Patient: Home in [place], [place]  Provider: [facility] in [place], [place]   I discussed the limitations, risks, security and privacy concerns of performing an evaluation and management service by telephone and the availability of in person appointments. I also discussed with the patient that there may be a patient responsible charge related to this service. The patient expressed understanding and agreed to proceed.   History of Present Illness:  States she had COVID a few weeks ago and has been around granddaughter who has had a cold recently  States symptoms started Monday  Comprised of coughing, fatigue,   She is taking OTC cough and cold medication formulated for patient with high blood pressure but she ran out and did not have anything yesterday.  States this morning symptoms have returned again with anorexia, cough, mild body aches, fatigue but cough is improving today, and sinus congestion.  States cough has improved but is worse at night at this point.   She is using Netipot to help with congestion.   Denies sore throat, fever, SOB, nausea, vomiting, diarrhea.  States she has been trying to drink plenty of fluids but reports she does not have much of an appetite.    Observations/Objective:  Unable to visualize patient due to nature of telephone visit.  Limited to what is heard over phone Mild hoarseness and cough appreciated.   Assessment and Plan:  Problem List Items Addressed This Visit   None Visit Diagnoses     Viral URI with cough    -  Primary       Patient reports symptoms consistent with viral URI for 5 days that include cough, fatigue and reduced appetite.  She reports she will restart OTC regimen of Coricidin or similar multisymptom medication  to assist with symptom reduction Provided education on nighttime OTC medications she could use to help with evening symptom management Provided education on Netipot use and encouraged humidifier.  Patient given follow up precautions to schedule apt if symptoms are worsening after 3-4 days or not improving in 7 days.  She was also given instructions on when to go to ED/UC if dictated by symptoms.   Follow Up Instructions:    I discussed the assessment and treatment plan with the patient. The patient was provided an opportunity to ask questions and all were answered. The patient agreed with the plan and demonstrated an understanding of the instructions.   The patient was advised to call back or seek an in-person evaluation if the symptoms worsen or if the condition fails to improve as anticipated.  I provided 23 minutes of non-face-to-face time during this encounter.   Jalynne Persico E Journe Hallmark, PA-C

## 2021-11-04 NOTE — Telephone Encounter (Signed)
Noted  

## 2021-11-04 NOTE — Telephone Encounter (Signed)
Virtual visit scheduled today at 2:40pm with Erin Mecum, PA-C.

## 2021-11-04 NOTE — Patient Instructions (Addendum)
Use boiled and cooled water in your Netipot to help with sinus congestion A humidifier can be beneficial for nighttime to help with nasal dryness and congestion Continue using OTC multisymptom relief medications. (Coricidin and generic is okay)  If symptoms do not improve or worsen in the next 7 days please make a follow up visit for the office

## 2021-11-04 NOTE — Telephone Encounter (Signed)
  Chief Complaint: Dry cough that will not go away.  Also got sick from her granddaughter staying with her.  Had Covid not long ago too. Symptoms: Dry nagging cough, tired and "just don't feel good".   "I'm sick of this cough". Frequency: Worse at night but bothersome during the day too Pertinent Negatives: Patient denies Shortness of breath or chest tightness, no diarrhea or vomiting, no runny nose or sore throat though she sounded congested when I was triaging her.  No fever.  (She told me at the beginning of the call she had a lot of nasal congestion but then later said she did not when I asked her what other symptoms she was having).   (She sounded congested and she was sniffling while I was talking with her).   Disposition: [] ED /[] Urgent Care (no appt availability in office) / [] Appointment(In office/virtual)/ []  [facility]/ [facility]/ [] Refused Recommended Disposition  Additional Notes: Pt is requesting Dr. send in some cough medication for her.  She is taking an OTC medication for congestion and coughing for people with high BP.   She has run out of it and getting her son to get her some more of it because it was helping her.   Sounds like Coridedin HBP she mentioned the store brand is what she is using.  I let her know someone from Dr. office would be calling her back.   She was agreeable to this plan.

## 2021-11-04 NOTE — Telephone Encounter (Signed)
Reason for Disposition  Patient is HIGH RISK (e.g., age > 64 years, pregnant, HIV+, or chronic medical condition)  Answer Assessment - Initial Assessment Questions 1. WORST SYMPTOM: "What is your worst symptom?" (e.g., cough, runny nose, muscle aches, headache, sore throat, fever)      I have a headache, coughing, nasal congestion a lot, (I'm washing my sinuses with my Netti Pot), I don't have an appetite.   I kept my granddaughter the other day and she was sick.   Denies diarrhea or vomiting.   No fever or sore throat.   I was in the office Monday and saw Reis the nurse.   I had Covid id 2-3 wks ago.  I can't get rid of this cough.   She told me the cough usually hangs on.  I'm so sick of this cough. 2. ONSET: "When did your flu symptoms start?"      I started feeling bad last night.   I rinsed my nose with my Netti Pot.   I'm using a cold medicine for people with high BP.   I've run out of that.  It did help.  I'm going to call my son and see if he will go get me some more. 3. COUGH: "How bad is the cough?"       I'm having a dry cough.  It's just a nagging cough.  I got the cough after I had Covid.    4. RESPIRATORY DISTRESS: "Describe your breathing."      No shortness of breath or chest tightness.    5. FEVER: "Do you have a fever?" If Yes, ask: "What is your temperature, how was it measured, and when did it start?"     No fever.   6. EXPOSURE: "Were you exposed to someone with influenza?"       Yes my granddaughter was sick with something and she was with me, not sure what she had.   She goes to daycare and got sick from there.   7. FLU VACCINE: "Did you get a flu shot this year?"     Yes  8. HIGH RISK DISEASE: "Do you have any chronic medical problems?" (e.g., heart or lung disease, asthma, weak immune system, or other HIGH RISK conditions)     No cardiac or lung problems 9. PREGNANCY: "Is there any chance you are pregnant?" "When was your last menstrual period?"     N/A 10. OTHER  SYMPTOMS: "Do you have any other symptoms?"  (e.g., runny nose, muscle aches, headache, sore throat)       See above for symptoms. I have allergies and when the weather changes it affects me this way.  Protocols used: Influenza - [facility]

## 2021-11-10 ENCOUNTER — Other Ambulatory Visit: Payer: Self-pay | Admitting: Family Medicine

## 2021-11-15 DIAGNOSIS — J0100 Acute maxillary sinusitis, unspecified: Secondary | ICD-10-CM | POA: Diagnosis not present

## 2021-11-24 ENCOUNTER — Other Ambulatory Visit: Payer: Self-pay | Admitting: Family Medicine

## 2021-11-25 NOTE — Telephone Encounter (Signed)
Requested medication (s) are due for refill today:   Provider to review  Requested medication (s) are on the active medication list:   Yes  Future visit scheduled:   No   Last ordered: 08/15/2021 #30, 5 refills  Non delegated refill   Requested Prescriptions  Pending Prescriptions Disp Refills   clorazepate (TRANXENE) 15 MG tablet [Pharmacy Med Name: CLORAZEPATE 15MG  TABLETS] 30 tablet     Sig: TAKE 1/2 TABLET BY MOUTH THREE TIMES DAILY AS NEEDED     Not Delegated - Psychiatry:  Anxiolytics/Hypnotics Failed - 11/24/2021  3:26 PM      Failed - This refill cannot be delegated      Failed - Urine Drug Screen completed in last 360 days      Passed - Valid encounter within last 6 months    Recent Outpatient Visits           3 weeks ago Viral URI with cough   11/26/2021, Erin E, PA-C   2 months ago Obesity (BMI 30-39.9)   [facility], MD   7 months ago Annual physical exam   [facility], MD   1 year ago Overweight   [facility], MD   1 year ago Prediabetes   [facility], Sherrie Mustache, MD

## 2021-11-28 ENCOUNTER — Emergency Department: Payer: Medicare Other

## 2021-11-28 ENCOUNTER — Inpatient Hospital Stay
Admission: EM | Admit: 2021-11-28 | Discharge: 2021-12-04 | DRG: 521 | Disposition: A | Discharge status: Home Health | Payer: Medicare Other | Attending: Internal Medicine | Admitting: Internal Medicine

## 2021-11-28 ENCOUNTER — Other Ambulatory Visit: Payer: Self-pay

## 2021-11-28 DIAGNOSIS — Z471 Aftercare following joint replacement surgery: Secondary | ICD-10-CM | POA: Diagnosis not present

## 2021-11-28 DIAGNOSIS — Z419 Encounter for procedure for purposes other than remedying health state, unspecified: Secondary | ICD-10-CM

## 2021-11-28 DIAGNOSIS — R531 Weakness: Secondary | ICD-10-CM | POA: Diagnosis not present

## 2021-11-28 DIAGNOSIS — Z833 Family history of diabetes mellitus: Secondary | ICD-10-CM

## 2021-11-28 DIAGNOSIS — Z043 Encounter for examination and observation following other accident: Secondary | ICD-10-CM | POA: Diagnosis not present

## 2021-11-28 DIAGNOSIS — H3530 Unspecified macular degeneration: Secondary | ICD-10-CM | POA: Diagnosis present

## 2021-11-28 DIAGNOSIS — M419 Scoliosis, unspecified: Secondary | ICD-10-CM | POA: Diagnosis not present

## 2021-11-28 DIAGNOSIS — E878 Other disorders of electrolyte and fluid balance, not elsewhere classified: Secondary | ICD-10-CM | POA: Diagnosis present

## 2021-11-28 DIAGNOSIS — W010XXA Fall on same level from slipping, tripping and stumbling without subsequent striking against object, initial encounter: Secondary | ICD-10-CM | POA: Diagnosis present

## 2021-11-28 DIAGNOSIS — S0990XA Unspecified injury of head, initial encounter: Secondary | ICD-10-CM | POA: Diagnosis not present

## 2021-11-28 DIAGNOSIS — F419 Anxiety disorder, unspecified: Secondary | ICD-10-CM | POA: Diagnosis present

## 2021-11-28 DIAGNOSIS — S7291XA Unspecified fracture of right femur, initial encounter for closed fracture: Secondary | ICD-10-CM | POA: Diagnosis not present

## 2021-11-28 DIAGNOSIS — M810 Age-related osteoporosis without current pathological fracture: Secondary | ICD-10-CM | POA: Diagnosis present

## 2021-11-28 DIAGNOSIS — R52 Pain, unspecified: Secondary | ICD-10-CM | POA: Diagnosis not present

## 2021-11-28 DIAGNOSIS — Z832 Family history of diseases of the blood and blood-forming organs and certain disorders involving the immune mechanism: Secondary | ICD-10-CM

## 2021-11-28 DIAGNOSIS — I509 Heart failure, unspecified: Secondary | ICD-10-CM | POA: Diagnosis not present

## 2021-11-28 DIAGNOSIS — F32A Depression, unspecified: Secondary | ICD-10-CM | POA: Diagnosis present

## 2021-11-28 DIAGNOSIS — W19XXXA Unspecified fall, initial encounter: Secondary | ICD-10-CM

## 2021-11-28 DIAGNOSIS — I2510 Atherosclerotic heart disease of native coronary artery without angina pectoris: Secondary | ICD-10-CM | POA: Diagnosis present

## 2021-11-28 DIAGNOSIS — I951 Orthostatic hypotension: Secondary | ICD-10-CM | POA: Diagnosis present

## 2021-11-28 DIAGNOSIS — S72001A Fracture of unspecified part of neck of right femur, initial encounter for closed fracture: Principal | ICD-10-CM | POA: Diagnosis present

## 2021-11-28 DIAGNOSIS — G8918 Other acute postprocedural pain: Secondary | ICD-10-CM

## 2021-11-28 DIAGNOSIS — Z6829 Body mass index (BMI) 29.0-29.9, adult: Secondary | ICD-10-CM

## 2021-11-28 DIAGNOSIS — K219 Gastro-esophageal reflux disease without esophagitis: Secondary | ICD-10-CM | POA: Diagnosis present

## 2021-11-28 DIAGNOSIS — R519 Headache, unspecified: Secondary | ICD-10-CM | POA: Diagnosis not present

## 2021-11-28 DIAGNOSIS — Z743 Need for continuous supervision: Secondary | ICD-10-CM | POA: Diagnosis not present

## 2021-11-28 DIAGNOSIS — Z794 Long term (current) use of insulin: Secondary | ICD-10-CM

## 2021-11-28 DIAGNOSIS — Z8249 Family history of ischemic heart disease and other diseases of the circulatory system: Secondary | ICD-10-CM

## 2021-11-28 DIAGNOSIS — E1165 Type 2 diabetes mellitus with hyperglycemia: Secondary | ICD-10-CM | POA: Diagnosis present

## 2021-11-28 DIAGNOSIS — J309 Allergic rhinitis, unspecified: Secondary | ICD-10-CM | POA: Diagnosis present

## 2021-11-28 DIAGNOSIS — U071 COVID-19: Secondary | ICD-10-CM | POA: Diagnosis present

## 2021-11-28 DIAGNOSIS — E860 Dehydration: Secondary | ICD-10-CM | POA: Diagnosis not present

## 2021-11-28 DIAGNOSIS — I110 Hypertensive heart disease with heart failure: Secondary | ICD-10-CM | POA: Diagnosis not present

## 2021-11-28 DIAGNOSIS — E871 Hypo-osmolality and hyponatremia: Secondary | ICD-10-CM | POA: Diagnosis not present

## 2021-11-28 DIAGNOSIS — M25551 Pain in right hip: Secondary | ICD-10-CM | POA: Diagnosis not present

## 2021-11-28 DIAGNOSIS — Y92000 Kitchen of unspecified non-institutional (private) residence as  the place of occurrence of the external cause: Secondary | ICD-10-CM

## 2021-11-28 DIAGNOSIS — S72041A Displaced fracture of base of neck of right femur, initial encounter for closed fracture: Secondary | ICD-10-CM | POA: Diagnosis not present

## 2021-11-28 DIAGNOSIS — N920 Excessive and frequent menstruation with regular cycle: Secondary | ICD-10-CM | POA: Diagnosis present

## 2021-11-28 DIAGNOSIS — R296 Repeated falls: Secondary | ICD-10-CM | POA: Diagnosis present

## 2021-11-28 DIAGNOSIS — R6889 Other general symptoms and signs: Secondary | ICD-10-CM | POA: Diagnosis not present

## 2021-11-28 DIAGNOSIS — Z90710 Acquired absence of both cervix and uterus: Secondary | ICD-10-CM

## 2021-11-28 DIAGNOSIS — E876 Hypokalemia: Secondary | ICD-10-CM | POA: Diagnosis not present

## 2021-11-28 DIAGNOSIS — Z96641 Presence of right artificial hip joint: Secondary | ICD-10-CM | POA: Diagnosis not present

## 2021-11-28 DIAGNOSIS — E785 Hyperlipidemia, unspecified: Secondary | ICD-10-CM | POA: Diagnosis present

## 2021-11-28 DIAGNOSIS — I10 Essential (primary) hypertension: Secondary | ICD-10-CM | POA: Diagnosis not present

## 2021-11-28 MED ORDER — LACTATED RINGERS IV SOLN: INTRAVENOUS | Status: DC

## 2021-11-28 MED ORDER — FENTANYL CITRATE PF 50 MCG/ML IJ SOSY
50.0000 ug | PREFILLED_SYRINGE | INTRAMUSCULAR | Status: DC | PRN
  Administered 2021-11-29 (×2): 50 ug via INTRAVENOUS
  Filled 2021-11-28 (×2): qty 1

## 2021-11-28 NOTE — ED Provider Notes (Signed)
[facility] Provider Note    Event Date/Time   First MD Initiated Contact with Patient 11/28/21 2309     (approximate)   History   Fall and Hip Pain   HPI  Gabrielle Salinas is a 71 y.o. female  With a history of osteoporosis, hypertension, hyperlipidemia who presents for evaluation of right hip pain.  Patient reports that she tripped on a pair of slippers earlier today and fell onto her right hip.  She was unable to stand up and ambulate.  She is complaining of right hip pain that has been constant since the fall, worse with any type of movement of that leg.  She denies headache, neck pain, back pain, chest pain, abdominal pain.  She does not take any blood thinners.     Physical Exam   Triage Vital Signs: ED Triage Vitals  Enc Vitals Group     BP 11/28/21 2220 (!) 153/73     Pulse Rate 11/28/21 2220 84     Resp 11/28/21 2220 16     Temp 11/28/21 2220 97.7 F (36.5 C)     Temp Source 11/28/21 2220 Oral     SpO2 11/28/21 2220 99 %     Weight 11/28/21 2223 164 lb (74.4 kg)     Height 11/28/21 2223 5\' 3"  (1.6 m)     Head Circumference --      Peak Flow --      Pain Score 11/28/21 2223 10     Pain Loc --      Pain Edu? --      Excl. in [place]? --     Most recent vital signs: Vitals:   11/29/21 0130 11/29/21 0204  BP: (!) 157/74 (!) 171/74  Pulse: 81 87  Resp: 16 18  Temp:    SpO2: 100% 98%     Full spinal precautions maintained throughout the trauma exam. Constitutional: Alert and oriented. No acute distress. Does not appear intoxicated. HEENT Head: Normocephalic and atraumatic. Face: No facial bony tenderness. Stable midface Ears: No hemotympanum bilaterally. No Battle sign Eyes: No eye injury. PERRL. No raccoon eyes Nose: Nontender. No epistaxis. No rhinorrhea Mouth/Throat: Mucous membranes are moist. No oropharyngeal blood. No dental injury. Airway patent without stridor. Normal voice. Neck: no C-collar. No midline c-spine tenderness.   Cardiovascular: Normal rate, regular rhythm. Normal and symmetric distal pulses are present in all extremities. Pulmonary/Chest: Chest wall is stable and nontender to palpation/compression. Normal respiratory effort. Breath sounds are normal. No crepitus.  Abdominal: Soft, nontender, non distended. Musculoskeletal: Tender to palpation of the proximal right femur, right lower extremity is shortened and internally rotated . nontender with normal full range of motion in all other extremities. No deformities. No thoracic or lumbar midline spinal tenderness. Pelvis is stable. Skin: Skin is warm, dry and intact. No abrasions or contutions. Psychiatric: Speech and behavior are appropriate. Neurological: Normal speech and language. Moves all extremities to command. No gross focal neurologic deficits are appreciated.  Glascow Coma Score: 4 - Opens eyes on own 6 - Follows simple motor commands 5 - Alert and oriented GCS: 15    ED Results / Procedures / Treatments   Labs (all labs ordered are listed, but only abnormal results are displayed) Labs Reviewed  RESP PANEL BY RT-PCR (FLU A&B, COVID) ARPGX2 - Abnormal; Notable for the following components:      Result Value   SARS Coronavirus 2 by RT PCR POSITIVE (*)    All other components  within normal limits  BASIC METABOLIC PANEL - Abnormal; Notable for the following components:   Sodium 129 (*)    Chloride 96 (*)    Glucose, Bld 139 (*)    Calcium 8.3 (*)    All other components within normal limits  CBC  PROTIME-INR  C-REACTIVE PROTEIN  D-DIMER, QUANTITATIVE  FIBRINOGEN  FERRITIN  LACTATE DEHYDROGENASE  PROCALCITONIN  TYPE AND SCREEN  TYPE AND SCREEN  TROPONIN I (HIGH SENSITIVITY)     EKG  ED ECG REPORT I, Nita Sickle, the attending physician, personally viewed and interpreted this ECG.  [place]nis rhythm with a rate of 87, normal intervals, normal axis, no ST elevations or depressions.   RADIOLOGY I have personally  reviewed the images performed during this visit and I agree with the Radiologist's read.   Interpretation by Radiologist:  DG Chest 1 View  Result Date: 11/28/2021 CLINICAL DATA:  Status post fall EXAM: CHEST  1 VIEW COMPARISON:  None. FINDINGS: The heart and mediastinal contours are unchanged. No focal consolidation. No pulmonary edema. No pleural effusion. No pneumothorax. No acute osseous abnormality. Dextroscoliosis of the mid to lower thoracic spine. IMPRESSION: No active disease. Electronically Signed   By: Tish Frederickson M.D.   On: 11/28/2021 23:30   DG Hip Unilat  With Pelvis 2-3 Views Right  Result Date: 11/28/2021 CLINICAL DATA:  Right hip pain. EXAM: DG HIP (WITH OR WITHOUT PELVIS) 2-3V RIGHT COMPARISON:  None. FINDINGS: Marked severity foreshortening of the right femoral neck is seen. There is no evidence of dislocation. Sclerotic changes are seen involving the symphysis pubis. IMPRESSION: Acute fracture of the right femoral neck. Electronically Signed   By: Aram Candela M.D.   On: 11/28/2021 23:32      PROCEDURES:  Critical Care performed: No  Procedures   MEDICATIONS ORDERED IN ED: Medications  lactated ringers infusion ( Intravenous New Bag/Given 11/29/21 0050)  fentaNYL (SUBLIMAZE) injection 50 mcg (50 mcg Intravenous Given 11/29/21 0413)  HYDROcodone-acetaminophen (NORCO/VICODIN) 5-325 MG per tablet 1-2 tablet (has no administration in time range)  morphine 2 MG/ML injection 0.5 mg (0.5 mg Intravenous Given 11/29/21 0202)  magnesium hydroxide (MILK OF MAGNESIA) suspension 30 mL (has no administration in time range)  sodium phosphate (FLEET) 7-19 GM/118ML enema 1 enema (has no administration in time range)  ondansetron (ZOFRAN) injection 4 mg (4 mg Intravenous Given 11/29/21 0202)  traZODone (DESYREL) tablet 25 mg (has no administration in time range)  acetaminophen (TYLENOL) tablet 650 mg (has no administration in time range)  remdesivir 200 mg in sodium chloride 0.9% 250  mL IVPB (has no administration in time range)    Followed by  remdesivir 100 mg in sodium chloride 0.9 % 100 mL IVPB (has no administration in time range)  guaiFENesin-dextromethorphan (ROBITUSSIN DM) 100-10 MG/5ML syrup 10 mL (has no administration in time range)  chlorpheniramine-HYDROcodone (TUSSIONEX) 10-8 MG/5ML suspension 5 mL (has no administration in time range)  ascorbic acid (VITAMIN C) tablet 500 mg (has no administration in time range)  zinc sulfate capsule 220 mg (has no administration in time range)  cholecalciferol (VITAMIN D3) tablet 1,000 Units (has no administration in time range)     IMPRESSION / MDM / ASSESSMENT AND PLAN / ED COURSE  I reviewed the triage vital signs and the nursing notes.  71 y.o. female  With a history of osteoporosis, hypertension, hyperlipidemia who presents for evaluation of right hip pain.  Patient presented after mechanical fall from home with right hip pain.  On exam  patient is tender to palpation in the proximal femoral area in her extremities shortened and internally rotated.  Differential diagnoses including hip fracture or dislocation.  Plan: X-ray of the pelvis/hip.  ED COURSE: X-ray visualized by me concerning for an acute hip fracture, read confirmed by radiology.  Patient then underwent a portable chest for preop which was also visualized by me with no signs of an acute disease.  We will order basic preop labs including CBC, BMP, type and screen and INR.  We will get a COVID swab.  We will initiate IV hydration, IV fentanyl every hour as needed for pain.  Patient placed NPO.  Consult was placed with orthopedics.  Discussed the case with Dr. Rudene Christians who will plan for surgical repair in the morning.  Hospitalist consulted and agrees to admit patient to their service.  Patient is medical records review including her last visit with her PCP from October 2022 where she was seen for management of her osteoporosis.  Patient placed on telemetry for close  monitoring of cardiorespiratory status.  Patient has 2 sons at bedside who were updated on her condition and plans.  Labs for medical clearance showing hyponatremia with a sodium of 129, mild hyperglycemia with no evidence of DKA.  COVID positive with no oxygen requirement.  Normal CBC otherwise.  Patient cleared for admission to the hospitalist service.          FINAL CLINICAL IMPRESSION(S) / ED DIAGNOSES   Final diagnoses:  Fall, initial encounter  Closed fracture of neck of right femur, initial encounter ([place])  Hyponatremia  COVID-19     Rx / DC Orders   ED Discharge Orders     None        Note:  This document was prepared using Dragon voice recognition software and may include unintentional dictation errors.    Alfred Levins, [place], MD 11/29/21 [phone]

## 2021-11-28 NOTE — ED Triage Notes (Signed)
Pt presents to ER via ems from home with c/o left hip pain after tripping on rug and falling.  Pt states she landed on right side and is c/o upper right hip pain.  Pt A&O x4 at this time in NAD.  Some shortening and outward rotation noted to right leg.

## 2021-11-29 ENCOUNTER — Inpatient Hospital Stay: Payer: Medicare Other

## 2021-11-29 ENCOUNTER — Inpatient Hospital Stay: Payer: Medicare Other | Admitting: Registered Nurse

## 2021-11-29 ENCOUNTER — Encounter
Admission: EM | Disposition: A | Discharge status: Home Health | Payer: Self-pay | Source: Home / Self Care | Attending: Internal Medicine

## 2021-11-29 ENCOUNTER — Other Ambulatory Visit: Payer: Self-pay

## 2021-11-29 DIAGNOSIS — S72001A Fracture of unspecified part of neck of right femur, initial encounter for closed fracture: Secondary | ICD-10-CM | POA: Diagnosis present

## 2021-11-29 DIAGNOSIS — I2510 Atherosclerotic heart disease of native coronary artery without angina pectoris: Secondary | ICD-10-CM | POA: Diagnosis present

## 2021-11-29 DIAGNOSIS — Z832 Family history of diseases of the blood and blood-forming organs and certain disorders involving the immune mechanism: Secondary | ICD-10-CM | POA: Diagnosis not present

## 2021-11-29 DIAGNOSIS — E1165 Type 2 diabetes mellitus with hyperglycemia: Secondary | ICD-10-CM | POA: Diagnosis present

## 2021-11-29 DIAGNOSIS — E878 Other disorders of electrolyte and fluid balance, not elsewhere classified: Secondary | ICD-10-CM | POA: Diagnosis present

## 2021-11-29 DIAGNOSIS — R296 Repeated falls: Secondary | ICD-10-CM | POA: Diagnosis present

## 2021-11-29 DIAGNOSIS — W19XXXA Unspecified fall, initial encounter: Secondary | ICD-10-CM

## 2021-11-29 DIAGNOSIS — M25551 Pain in right hip: Secondary | ICD-10-CM | POA: Diagnosis present

## 2021-11-29 DIAGNOSIS — H3530 Unspecified macular degeneration: Secondary | ICD-10-CM | POA: Diagnosis present

## 2021-11-29 DIAGNOSIS — S0990XA Unspecified injury of head, initial encounter: Secondary | ICD-10-CM | POA: Diagnosis not present

## 2021-11-29 DIAGNOSIS — U071 COVID-19: Secondary | ICD-10-CM

## 2021-11-29 DIAGNOSIS — Z96641 Presence of right artificial hip joint: Secondary | ICD-10-CM | POA: Diagnosis not present

## 2021-11-29 DIAGNOSIS — M810 Age-related osteoporosis without current pathological fracture: Secondary | ICD-10-CM | POA: Diagnosis present

## 2021-11-29 DIAGNOSIS — R519 Headache, unspecified: Secondary | ICD-10-CM | POA: Diagnosis not present

## 2021-11-29 DIAGNOSIS — I951 Orthostatic hypotension: Secondary | ICD-10-CM | POA: Diagnosis present

## 2021-11-29 DIAGNOSIS — E871 Hypo-osmolality and hyponatremia: Secondary | ICD-10-CM

## 2021-11-29 DIAGNOSIS — E860 Dehydration: Secondary | ICD-10-CM | POA: Diagnosis present

## 2021-11-29 DIAGNOSIS — Z471 Aftercare following joint replacement surgery: Secondary | ICD-10-CM | POA: Diagnosis not present

## 2021-11-29 DIAGNOSIS — F32A Depression, unspecified: Secondary | ICD-10-CM | POA: Diagnosis present

## 2021-11-29 DIAGNOSIS — W010XXA Fall on same level from slipping, tripping and stumbling without subsequent striking against object, initial encounter: Secondary | ICD-10-CM | POA: Diagnosis present

## 2021-11-29 DIAGNOSIS — R531 Weakness: Secondary | ICD-10-CM | POA: Diagnosis not present

## 2021-11-29 DIAGNOSIS — E785 Hyperlipidemia, unspecified: Secondary | ICD-10-CM | POA: Diagnosis present

## 2021-11-29 DIAGNOSIS — Z8249 Family history of ischemic heart disease and other diseases of the circulatory system: Secondary | ICD-10-CM | POA: Diagnosis not present

## 2021-11-29 DIAGNOSIS — Y92000 Kitchen of unspecified non-institutional (private) residence as  the place of occurrence of the external cause: Secondary | ICD-10-CM | POA: Diagnosis not present

## 2021-11-29 DIAGNOSIS — Z794 Long term (current) use of insulin: Secondary | ICD-10-CM | POA: Diagnosis not present

## 2021-11-29 DIAGNOSIS — E876 Hypokalemia: Secondary | ICD-10-CM | POA: Diagnosis present

## 2021-11-29 DIAGNOSIS — I509 Heart failure, unspecified: Secondary | ICD-10-CM | POA: Diagnosis present

## 2021-11-29 DIAGNOSIS — F419 Anxiety disorder, unspecified: Secondary | ICD-10-CM | POA: Diagnosis present

## 2021-11-29 DIAGNOSIS — I110 Hypertensive heart disease with heart failure: Secondary | ICD-10-CM | POA: Diagnosis present

## 2021-11-29 DIAGNOSIS — Z833 Family history of diabetes mellitus: Secondary | ICD-10-CM | POA: Diagnosis not present

## 2021-11-29 DIAGNOSIS — Z90710 Acquired absence of both cervix and uterus: Secondary | ICD-10-CM | POA: Diagnosis not present

## 2021-11-29 DIAGNOSIS — Z6829 Body mass index (BMI) 29.0-29.9, adult: Secondary | ICD-10-CM | POA: Diagnosis not present

## 2021-11-29 DIAGNOSIS — J309 Allergic rhinitis, unspecified: Secondary | ICD-10-CM | POA: Diagnosis present

## 2021-11-29 HISTORY — PX: TOTAL HIP ARTHROPLASTY: SHX124

## 2021-11-29 HISTORY — DX: COVID-19: U07.1

## 2021-11-29 LAB — CBC
HCT: 41.2 % (ref 36.0–46.0)
Hemoglobin: 14.1 g/dL (ref 12.0–15.0)
MCH: 32.6 pg (ref 26.0–34.0)
MCHC: 34.2 g/dL (ref 30.0–36.0)
MCV: 95.2 fL (ref 80.0–100.0)
Platelets: 204 10*3/uL (ref 150–400)
RBC: 4.33 MIL/uL (ref 3.87–5.11)
RDW: 13 % (ref 11.5–15.5)
WBC: 9.6 10*3/uL (ref 4.0–10.5)
nRBC: 0 % (ref 0.0–0.2)

## 2021-11-29 LAB — SURGICAL PCR SCREEN
MRSA, PCR: NEGATIVE
Staphylococcus aureus: NEGATIVE

## 2021-11-29 LAB — BASIC METABOLIC PANEL
Anion gap: 9 (ref 5–15)
BUN: 13 mg/dL (ref 8–23)
CO2: 24 mmol/L (ref 22–32)
Calcium: 8.3 mg/dL — ABNORMAL LOW (ref 8.9–10.3)
Chloride: 96 mmol/L — ABNORMAL LOW (ref 98–111)
Creatinine, Ser: 0.72 mg/dL (ref 0.44–1.00)
GFR, Estimated: >60 mL/min (ref >60)
Glucose, Bld: 139 mg/dL — ABNORMAL HIGH (ref 70–99)
Potassium: 3.5 mmol/L (ref 3.5–5.1)
Sodium: 129 mmol/L — ABNORMAL LOW (ref 135–145)

## 2021-11-29 LAB — TYPE AND SCREEN
ABO/RH(D): O POS
Antibody Screen: NEGATIVE

## 2021-11-29 LAB — PROCALCITONIN: Procalcitonin: <0.1 ng/mL

## 2021-11-29 LAB — C-REACTIVE PROTEIN: CRP: 9.7 mg/dL — ABNORMAL HIGH (ref <1.0)

## 2021-11-29 LAB — TROPONIN I (HIGH SENSITIVITY)
Troponin I (High Sensitivity): 9 ng/L (ref <18)
Troponin I (High Sensitivity): 9 ng/L (ref <18)

## 2021-11-29 LAB — D-DIMER, QUANTITATIVE: D-Dimer, Quant: 6.99 ug/mL-FEU — ABNORMAL HIGH (ref 0.00–0.50)

## 2021-11-29 LAB — FIBRINOGEN: Fibrinogen: 449 mg/dL (ref 210–475)

## 2021-11-29 LAB — FERRITIN: Ferritin: 150 ng/mL (ref 11–307)

## 2021-11-29 LAB — RESP PANEL BY RT-PCR (FLU A&B, COVID) ARPGX2
Influenza A by PCR: NEGATIVE
Influenza B by PCR: NEGATIVE
SARS Coronavirus 2 by RT PCR: POSITIVE — AB

## 2021-11-29 LAB — LACTATE DEHYDROGENASE: LDH: 189 U/L (ref 98–192)

## 2021-11-29 LAB — PROTIME-INR
INR: 1.1 (ref 0.8–1.2)
Prothrombin Time: 14.3 seconds (ref 11.4–15.2)

## 2021-11-29 SURGERY — ARTHROPLASTY, HIP, TOTAL, ANTERIOR APPROACH
Anesthesia: General | Site: Hip | Laterality: Right

## 2021-11-29 MED ORDER — BISACODYL 10 MG RE SUPP: 10.0000 mg | Freq: Every day | RECTAL | Status: DC | PRN

## 2021-11-29 MED ORDER — ALUM & MAG HYDROXIDE-SIMETH 200-200-20 MG/5ML PO SUSP: 30.0000 mL | ORAL | Status: DC | PRN

## 2021-11-29 MED ORDER — PRESERVISION AREDS 2 PO CAPS: 1.0000 | ORAL_CAPSULE | Freq: Two times a day (BID) | ORAL | Status: DC

## 2021-11-29 MED ORDER — ADULT MULTIVITAMIN W/MINERALS CH
1.0000 | ORAL_TABLET | Freq: Every day | ORAL | Status: DC
  Administered 2021-11-30 – 2021-12-04 (×5): 1 via ORAL
  Filled 2021-11-29 (×5): qty 1

## 2021-11-29 MED ORDER — NEOMYCIN-POLYMYXIN B GU 40-200000 IR SOLN
Status: AC
  Filled 2021-11-29: qty 4

## 2021-11-29 MED ORDER — OCUVITE-LUTEIN PO CAPS
1.0000 | ORAL_CAPSULE | Freq: Every day | ORAL | Status: DC
  Administered 2021-11-30 – 2021-12-04 (×5): 1 via ORAL
  Filled 2021-11-29 (×5): qty 1

## 2021-11-29 MED ORDER — ONDANSETRON HCL 4 MG/2ML IJ SOLN
4.0000 mg | INTRAMUSCULAR | Status: DC | PRN
  Administered 2021-11-29: 4 mg via INTRAVENOUS
  Filled 2021-11-29: qty 2

## 2021-11-29 MED ORDER — CHLORHEXIDINE GLUCONATE CLOTH 2 % EX PADS
6.0000 | MEDICATED_PAD | Freq: Every day | CUTANEOUS | Status: DC
  Administered 2021-11-30 – 2021-12-04 (×5): 6 via TOPICAL

## 2021-11-29 MED ORDER — PHENYLEPHRINE HCL (PRESSORS) 10 MG/ML IV SOLN
INTRAVENOUS | Status: AC
  Filled 2021-11-29: qty 1

## 2021-11-29 MED ORDER — PHENYLEPHRINE HCL (PRESSORS) 10 MG/ML IV SOLN
INTRAVENOUS | Status: DC | PRN
  Administered 2021-11-29: 160 ug via INTRAVENOUS
  Administered 2021-11-29 (×2): 80 ug via INTRAVENOUS
  Administered 2021-11-29: 160 ug via INTRAVENOUS
  Administered 2021-11-29: 80 ug via INTRAVENOUS
  Administered 2021-11-29: 160 ug via INTRAVENOUS

## 2021-11-29 MED ORDER — METHOCARBAMOL 1000 MG/10ML IJ SOLN
500.0000 mg | Freq: Four times a day (QID) | INTRAVENOUS | Status: DC | PRN
  Filled 2021-11-29: qty 5

## 2021-11-29 MED ORDER — EPHEDRINE 5 MG/ML INJ
INTRAVENOUS | Status: AC
  Filled 2021-11-29: qty 5

## 2021-11-29 MED ORDER — BUPIVACAINE-EPINEPHRINE (PF) 0.25% -1:200000 IJ SOLN
INTRAMUSCULAR | Status: AC
  Filled 2021-11-29: qty 30

## 2021-11-29 MED ORDER — ASCORBIC ACID 500 MG PO TABS
500.0000 mg | ORAL_TABLET | Freq: Every day | ORAL | Status: DC
  Administered 2021-11-30 – 2021-12-04 (×5): 500 mg via ORAL
  Filled 2021-11-29 (×6): qty 1

## 2021-11-29 MED ORDER — ZOLPIDEM TARTRATE 5 MG PO TABS: 5.0000 mg | ORAL_TABLET | Freq: Every evening | ORAL | Status: DC | PRN

## 2021-11-29 MED ORDER — LISINOPRIL 10 MG PO TABS
10.0000 mg | ORAL_TABLET | Freq: Every day | ORAL | Status: DC
  Administered 2021-11-30: 10 mg via ORAL
  Filled 2021-11-29: qty 1

## 2021-11-29 MED ORDER — SODIUM CHLORIDE 0.9 % IV SOLN
100.0000 mg | Freq: Every day | INTRAVENOUS | Status: DC
  Filled 2021-11-29: qty 20

## 2021-11-29 MED ORDER — BUPIVACAINE HCL (PF) 0.5 % IJ SOLN
INTRAMUSCULAR | Status: DC | PRN
  Administered 2021-11-29: 2.5 mL via INTRATHECAL

## 2021-11-29 MED ORDER — ENOXAPARIN SODIUM 40 MG/0.4ML IJ SOSY
40.0000 mg | PREFILLED_SYRINGE | INTRAMUSCULAR | Status: DC
  Administered 2021-11-30 – 2021-12-04 (×5): 40 mg via SUBCUTANEOUS
  Filled 2021-11-29 (×5): qty 0.4

## 2021-11-29 MED ORDER — DOCUSATE SODIUM 100 MG PO CAPS
100.0000 mg | ORAL_CAPSULE | Freq: Two times a day (BID) | ORAL | Status: DC
  Administered 2021-11-29 – 2021-12-04 (×9): 100 mg via ORAL
  Filled 2021-11-29 (×10): qty 1

## 2021-11-29 MED ORDER — SODIUM CHLORIDE 0.9 % IV SOLN: INTRAVENOUS | Status: DC

## 2021-11-29 MED ORDER — PHENYLEPHRINE HCL-NACL 20-0.9 MG/250ML-% IV SOLN
INTRAVENOUS | Status: DC | PRN
  Administered 2021-11-29: 25 ug/min via INTRAVENOUS

## 2021-11-29 MED ORDER — MENTHOL 3 MG MT LOZG
1.0000 | LOZENGE | OROMUCOSAL | Status: DC | PRN
  Filled 2021-11-29: qty 9

## 2021-11-29 MED ORDER — MAGNESIUM HYDROXIDE 400 MG/5ML PO SUSP: 30.0000 mL | Freq: Every day | ORAL | Status: DC | PRN

## 2021-11-29 MED ORDER — OYSTER SHELL CALCIUM/D3 500-5 MG-MCG PO TABS
1.0000 | ORAL_TABLET | Freq: Every day | ORAL | Status: DC
  Administered 2021-11-30 – 2021-12-04 (×5): 1 via ORAL
  Filled 2021-11-29 (×5): qty 1

## 2021-11-29 MED ORDER — PROPOFOL 500 MG/50ML IV EMUL
INTRAVENOUS | Status: DC | PRN
  Administered 2021-11-29: 50 ug/kg/min via INTRAVENOUS

## 2021-11-29 MED ORDER — DIPHENHYDRAMINE HCL 12.5 MG/5ML PO ELIX
12.5000 mg | ORAL_SOLUTION | ORAL | Status: DC | PRN
  Administered 2021-12-02: 12.5 mg via ORAL
  Filled 2021-11-29: qty 5

## 2021-11-29 MED ORDER — PROPOFOL 10 MG/ML IV BOLUS
INTRAVENOUS | Status: DC | PRN
Start: 2021-11-29 — End: 2021-11-29
  Administered 2021-11-29: 20 mg via INTRAVENOUS

## 2021-11-29 MED ORDER — ENSURE ENLIVE PO LIQD
237.0000 mL | Freq: Two times a day (BID) | ORAL | Status: DC
  Administered 2021-11-30 – 2021-12-04 (×7): 237 mL via ORAL
  Filled 2021-11-29 (×5): qty 237

## 2021-11-29 MED ORDER — PHENOL 1.4 % MT LIQD
1.0000 | OROMUCOSAL | Status: DC | PRN
  Filled 2021-11-29: qty 177

## 2021-11-29 MED ORDER — SODIUM CHLORIDE 0.9 % IV SOLN
200.0000 mg | Freq: Once | INTRAVENOUS | Status: AC
  Administered 2021-11-29: 200 mg via INTRAVENOUS
  Filled 2021-11-29: qty 40

## 2021-11-29 MED ORDER — ZINC SULFATE 220 (50 ZN) MG PO CAPS
220.0000 mg | ORAL_CAPSULE | Freq: Every day | ORAL | Status: DC
  Administered 2021-11-30 – 2021-12-04 (×5): 220 mg via ORAL
  Filled 2021-11-29 (×6): qty 1

## 2021-11-29 MED ORDER — SODIUM CHLORIDE (PF) 0.9 % IJ SOLN
INTRAMUSCULAR | Status: AC
  Filled 2021-11-29: qty 10

## 2021-11-29 MED ORDER — FENTANYL CITRATE (PF) 100 MCG/2ML IJ SOLN
INTRAMUSCULAR | Status: DC | PRN
  Administered 2021-11-29: 50 ug via INTRAVENOUS

## 2021-11-29 MED ORDER — METOCLOPRAMIDE HCL 10 MG PO TABS: 5.0000 mg | ORAL_TABLET | Freq: Three times a day (TID) | ORAL | Status: DC | PRN

## 2021-11-29 MED ORDER — HEMOSTATIC AGENTS (NO CHARGE) OPTIME
TOPICAL | Status: DC | PRN
  Administered 2021-11-29: 2 via TOPICAL

## 2021-11-29 MED ORDER — METHOCARBAMOL 500 MG PO TABS
500.0000 mg | ORAL_TABLET | Freq: Four times a day (QID) | ORAL | Status: DC | PRN
  Administered 2021-11-29 – 2021-12-04 (×4): 500 mg via ORAL
  Filled 2021-11-29 (×4): qty 1

## 2021-11-29 MED ORDER — CEFAZOLIN SODIUM-DEXTROSE 1-4 GM/50ML-% IV SOLN
1.0000 g | Freq: Once | INTRAVENOUS | Status: AC
  Administered 2021-11-29: 1 g via INTRAVENOUS
  Filled 2021-11-29: qty 50

## 2021-11-29 MED ORDER — EPHEDRINE SULFATE 50 MG/ML IJ SOLN
INTRAMUSCULAR | Status: DC | PRN
Start: 2021-11-29 — End: 2021-11-29
  Administered 2021-11-29 (×5): 5 mg via INTRAVENOUS

## 2021-11-29 MED ORDER — CEFAZOLIN SODIUM-DEXTROSE 1-4 GM/50ML-% IV SOLN
1.0000 g | Freq: Four times a day (QID) | INTRAVENOUS | Status: AC
  Administered 2021-11-29 – 2021-11-30 (×2): 1 g via INTRAVENOUS
  Filled 2021-11-29 (×2): qty 50

## 2021-11-29 MED ORDER — VITAMIN D 25 MCG (1000 UNIT) PO TABS
1000.0000 [IU] | ORAL_TABLET | Freq: Every day | ORAL | Status: DC
  Administered 2021-11-30 – 2021-12-04 (×5): 1000 [IU] via ORAL
  Filled 2021-11-29 (×6): qty 1

## 2021-11-29 MED ORDER — CEFAZOLIN SODIUM-DEXTROSE 2-3 GM-%(50ML) IV SOLR
INTRAVENOUS | Status: DC | PRN
  Administered 2021-11-29: 1 g via INTRAVENOUS

## 2021-11-29 MED ORDER — PROPOFOL 1000 MG/100ML IV EMUL
INTRAVENOUS | Status: AC
  Filled 2021-11-29: qty 100

## 2021-11-29 MED ORDER — FENTANYL CITRATE (PF) 100 MCG/2ML IJ SOLN
INTRAMUSCULAR | Status: AC
  Filled 2021-11-29: qty 2

## 2021-11-29 MED ORDER — ONDANSETRON HCL 4 MG PO TABS: 4.0000 mg | ORAL_TABLET | Freq: Four times a day (QID) | ORAL | Status: DC | PRN

## 2021-11-29 MED ORDER — MORPHINE SULFATE (PF) 2 MG/ML IV SOLN
0.5000 mg | INTRAVENOUS | Status: DC | PRN
  Administered 2021-11-29 – 2021-12-02 (×6): 0.5 mg via INTRAVENOUS
  Filled 2021-11-29 (×6): qty 1

## 2021-11-29 MED ORDER — GUAIFENESIN-DM 100-10 MG/5ML PO SYRP
10.0000 mL | ORAL_SOLUTION | ORAL | Status: DC | PRN
  Administered 2021-12-02 – 2021-12-03 (×2): 10 mL via ORAL
  Filled 2021-11-29 (×3): qty 10

## 2021-11-29 MED ORDER — SODIUM CHLORIDE (PF) 0.9 % IJ SOLN
INTRAMUSCULAR | Status: DC | PRN
  Administered 2021-11-29: 90 mL via INTRAMUSCULAR

## 2021-11-29 MED ORDER — TRAZODONE HCL 50 MG PO TABS
25.0000 mg | ORAL_TABLET | Freq: Every evening | ORAL | Status: DC | PRN
  Administered 2021-11-29 – 2021-12-03 (×3): 25 mg via ORAL
  Filled 2021-11-29 (×3): qty 1

## 2021-11-29 MED ORDER — FLEET ENEMA 7-19 GM/118ML RE ENEM: 1.0000 | ENEMA | Freq: Once | RECTAL | Status: DC | PRN

## 2021-11-29 MED ORDER — METOCLOPRAMIDE HCL 5 MG/ML IJ SOLN: 5.0000 mg | Freq: Three times a day (TID) | INTRAMUSCULAR | Status: DC | PRN

## 2021-11-29 MED ORDER — METOPROLOL TARTRATE 5 MG/5ML IV SOLN
INTRAVENOUS | Status: AC
  Filled 2021-11-29: qty 5

## 2021-11-29 MED ORDER — MIDAZOLAM HCL 2 MG/2ML IJ SOLN
INTRAMUSCULAR | Status: AC
  Filled 2021-11-29: qty 2

## 2021-11-29 MED ORDER — HYDROCOD POLST-CPM POLST ER 10-8 MG/5ML PO SUER: 5.0000 mL | Freq: Two times a day (BID) | ORAL | Status: DC | PRN

## 2021-11-29 MED ORDER — EZETIMIBE 10 MG PO TABS
10.0000 mg | ORAL_TABLET | Freq: Every day | ORAL | Status: DC
  Administered 2021-11-30 – 2021-12-04 (×5): 10 mg via ORAL
  Filled 2021-11-29 (×5): qty 1

## 2021-11-29 MED ORDER — BUPIVACAINE LIPOSOME 1.3 % IJ SUSP
INTRAMUSCULAR | Status: AC
  Filled 2021-11-29: qty 20

## 2021-11-29 MED ORDER — MIDAZOLAM HCL 5 MG/5ML IJ SOLN
INTRAMUSCULAR | Status: DC | PRN
  Administered 2021-11-29: 2 mg via INTRAVENOUS

## 2021-11-29 MED ORDER — HYDROCODONE-ACETAMINOPHEN 5-325 MG PO TABS
1.0000 | ORAL_TABLET | Freq: Four times a day (QID) | ORAL | Status: DC | PRN
  Administered 2021-11-29 (×2): 1 via ORAL
  Administered 2021-11-30 – 2021-12-01 (×4): 2 via ORAL
  Administered 2021-12-02: 1 via ORAL
  Administered 2021-12-02 – 2021-12-03 (×2): 2 via ORAL
  Administered 2021-12-04: 1 via ORAL
  Filled 2021-11-29: qty 2
  Filled 2021-11-29: qty 1
  Filled 2021-11-29 (×3): qty 2
  Filled 2021-11-29: qty 1
  Filled 2021-11-29 (×2): qty 2
  Filled 2021-11-29: qty 1
  Filled 2021-11-29: qty 2

## 2021-11-29 MED ORDER — ACETAMINOPHEN 325 MG PO TABS
650.0000 mg | ORAL_TABLET | ORAL | Status: DC | PRN
  Administered 2021-12-04: 650 mg via ORAL
  Filled 2021-11-29: qty 2

## 2021-11-29 MED ORDER — SODIUM CHLORIDE 0.9 % IV SOLN
Status: DC | PRN
  Administered 2021-11-29: 1004 mL

## 2021-11-29 MED ORDER — PANTOPRAZOLE SODIUM 40 MG PO TBEC
40.0000 mg | DELAYED_RELEASE_TABLET | Freq: Every day | ORAL | Status: DC
  Administered 2021-11-29 – 2021-12-04 (×6): 40 mg via ORAL
  Filled 2021-11-29 (×6): qty 1

## 2021-11-29 MED ORDER — FLUOXETINE HCL 20 MG PO CAPS
20.0000 mg | ORAL_CAPSULE | Freq: Every day | ORAL | Status: DC
  Administered 2021-11-29 – 2021-12-03 (×5): 20 mg via ORAL
  Filled 2021-11-29 (×7): qty 1

## 2021-11-29 MED ORDER — SODIUM CHLORIDE FLUSH 0.9 % IV SOLN
INTRAVENOUS | Status: AC
  Filled 2021-11-29: qty 40

## 2021-11-29 MED ORDER — ALPRAZOLAM 0.25 MG PO TABS
0.2500 mg | ORAL_TABLET | Freq: Four times a day (QID) | ORAL | Status: DC | PRN
  Filled 2021-11-29: qty 1

## 2021-11-29 MED ORDER — ONDANSETRON HCL 4 MG/2ML IJ SOLN: 4.0000 mg | Freq: Four times a day (QID) | INTRAMUSCULAR | Status: DC | PRN

## 2021-11-29 MED ORDER — MAGNESIUM HYDROXIDE 400 MG/5ML PO SUSP
30.0000 mL | Freq: Every day | ORAL | Status: AC
  Administered 2021-11-29 – 2021-12-01 (×3): 30 mL via ORAL
  Filled 2021-11-29 (×4): qty 30

## 2021-11-29 MED ORDER — CEFAZOLIN SODIUM 1 G IJ SOLR
INTRAMUSCULAR | Status: AC
  Filled 2021-11-29: qty 10

## 2021-11-29 MED ORDER — HYDROCHLOROTHIAZIDE 12.5 MG PO TABS
12.5000 mg | ORAL_TABLET | Freq: Every day | ORAL | Status: DC
  Administered 2021-11-30: 12.5 mg via ORAL
  Filled 2021-11-29: qty 1

## 2021-11-29 MED ORDER — VASOPRESSIN 20 UNIT/ML IV SOLN
INTRAVENOUS | Status: AC
  Filled 2021-11-29: qty 1

## 2021-11-29 MED ORDER — CALCIUM CARB-CHOLECALCIFEROL 600-500 MG-UNIT PO CAPS: 600.0000 [IU] | ORAL_CAPSULE | Freq: Every day | ORAL | Status: DC

## 2021-11-29 SURGICAL SUPPLY — 63 items
BLADE SAGITTAL AGGR TOOTH XLG (BLADE) ×2 IMPLANT
BNDG COHESIVE 6X5 TAN ST LF (GAUZE/BANDAGES/DRESSINGS) ×6 IMPLANT
CANISTER WOUND CARE 500ML ATS (WOUND CARE) ×2 IMPLANT
CHLORAPREP W/TINT 26 (MISCELLANEOUS) ×2 IMPLANT
COVER BACK TABLE REUSABLE LG (DRAPES) ×2 IMPLANT
DRAPE 3/4 80X56 (DRAPES) ×6 IMPLANT
DRAPE C-ARM XRAY 36X54 (DRAPES) ×2 IMPLANT
DRAPE INCISE IOBAN 66X60 STRL (DRAPES) IMPLANT
DRAPE POUCH INSTRU U-SHP 10X18 (DRAPES) ×2 IMPLANT
DRESSING SURGICEL FIBRLLR 1X2 (HEMOSTASIS) ×2 IMPLANT
DRSG MEPILEX SACRM 8.7X9.8 (GAUZE/BANDAGES/DRESSINGS) ×2 IMPLANT
DRSG OPSITE POSTOP 4X8 (GAUZE/BANDAGES/DRESSINGS) ×4 IMPLANT
DRSG SURGICEL FIBRILLAR 1X2 (HEMOSTASIS) ×4
ELECT BLADE 6.5 EXT (BLADE) ×2 IMPLANT
ELECT REM PT RETURN 9FT ADLT (ELECTROSURGICAL) ×2
ELECTRODE REM PT RTRN 9FT ADLT (ELECTROSURGICAL) ×1 IMPLANT
GAUZE 4X4 16PLY ~~LOC~~+RFID DBL (SPONGE) ×2 IMPLANT
GLOVE SURG SYN 9.0  PF PI (GLOVE) ×2
GLOVE SURG SYN 9.0 PF PI (GLOVE) ×2 IMPLANT
GLOVE SURG UNDER POLY LF SZ9 (GLOVE) ×2 IMPLANT
GOWN SRG 2XL LVL 4 RGLN SLV (GOWNS) ×1 IMPLANT
GOWN STRL NON-REIN 2XL LVL4 (GOWNS) ×1
GOWN STRL REUS W/ TWL LRG LVL3 (GOWN DISPOSABLE) ×1 IMPLANT
GOWN STRL REUS W/TWL LRG LVL3 (GOWN DISPOSABLE) ×1
HEMOVAC 400CC 10FR (MISCELLANEOUS) IMPLANT
HIP FEM HD S 28 (Head) ×1 IMPLANT
HOLDER FOLEY CATH W/STRAP (MISCELLANEOUS) ×2 IMPLANT
KIT PREVENA INCISION MGT 13 (CANNISTER) ×2 IMPLANT
LINER DUAL MOB 50MM (Liner) ×1 IMPLANT
MANIFOLD NEPTUNE II (INSTRUMENTS) ×2 IMPLANT
MAT ABSORB  FLUID 56X50 GRAY (MISCELLANEOUS) ×1
MAT ABSORB FLUID 56X50 GRAY (MISCELLANEOUS) ×1 IMPLANT
NDL SAFETY ECLIPSE 18X1.5 (NEEDLE) ×1 IMPLANT
NDL SPNL 20GX3.5 QUINCKE YW (NEEDLE) ×2 IMPLANT
NEEDLE HYPO 18GX1.5 SHARP (NEEDLE) ×1
NEEDLE SPNL 20GX3.5 QUINCKE YW (NEEDLE) ×4 IMPLANT
NS IRRIG 1000ML POUR BTL (IV SOLUTION) ×2 IMPLANT
PACK HIP COMPR (MISCELLANEOUS) ×2 IMPLANT
SCALPEL PROTECTED #10 DISP (BLADE) ×4 IMPLANT
SHELL ACETABULAR SZ0 50 DME (Shell) ×1 IMPLANT
SOL PREP PVP 2OZ (MISCELLANEOUS)
SOLUTION PREP PVP 2OZ (MISCELLANEOUS) ×1 IMPLANT
SOLUTION PRONTOSAN WOUND 350ML (IRRIGATION / IRRIGATOR) IMPLANT
SPONGE DRAIN TRACH 4X4 STRL 2S (GAUZE/BANDAGES/DRESSINGS) ×1 IMPLANT
SPONGE T-LAP 18X18 ~~LOC~~+RFID (SPONGE) ×4 IMPLANT
STAPLER SKIN PROX 35W (STAPLE) ×2 IMPLANT
STEM FEMORAL 4 STD COLLARED (Stem) ×1 IMPLANT
STRAP SAFETY 5IN WIDE (MISCELLANEOUS) ×2 IMPLANT
SUT DVC 2 QUILL PDO  T11 36X36 (SUTURE) ×1
SUT DVC 2 QUILL PDO T11 36X36 (SUTURE) ×1 IMPLANT
SUT SILK 0 (SUTURE) ×1
SUT SILK 0 30XBRD TIE 6 (SUTURE) ×1 IMPLANT
SUT V-LOC 90 ABS DVC 3-0 CL (SUTURE) ×2 IMPLANT
SUT VIC AB 1 CT1 36 (SUTURE) ×2 IMPLANT
SYR 20ML LL LF (SYRINGE) ×2 IMPLANT
SYR 30ML LL (SYRINGE) ×2 IMPLANT
SYR 50ML LL SCALE MARK (SYRINGE) ×4 IMPLANT
SYR BULB IRRIG 60ML STRL (SYRINGE) ×2 IMPLANT
TAPE MICROFOAM 4IN (TAPE) ×1 IMPLANT
TOWEL OR 17X26 4PK STRL BLUE (TOWEL DISPOSABLE) ×1 IMPLANT
TRAY FOLEY MTR SLVR 16FR STAT (SET/KITS/TRAYS/PACK) ×2 IMPLANT
WATER STERILE IRR 1000ML POUR (IV SOLUTION) ×1 IMPLANT
WATER STERILE IRR 500ML POUR (IV SOLUTION) ×2 IMPLANT

## 2021-11-29 NOTE — Progress Notes (Signed)
Initial Nutrition Assessment  DOCUMENTATION CODES:   Not applicable  INTERVENTION:   -Once diet is advanced, add:   -MVI with minerals daily -Ensure Enlive po BID, each supplement provides 350 kcal and 20 grams of protein   NUTRITION DIAGNOSIS:   Increased nutrient needs related to post-op healing as evidenced by estimated needs.  GOAL:   Patient will meet greater than or equal to 90% of their needs  MONITOR:   PO intake, Supplement acceptance, Diet advancement, Labs, Weight trends, Skin, I & O's  REASON FOR ASSESSMENT:   Consult Assessment of nutrition requirement/status, Hip fracture protocol  ASSESSMENT:   Gabrielle Salinas is a 71 y.o. Caucasian female with medical history significant for anxiety, depression, GERD, dyslipidemia, hypertension, prediabetes, macular degeneration and osteoporosis, who presented to the ER with acute onset of left hip pain after having an accidental mechanical fall.  The patient tripped over her shoes and fell on her right side.  She had subsequent inability to ambulate due to pain with any movement of the right hip.  No presyncope or syncope headache or dizziness or blurred vision.  No paresthesias or focal muscle weakness.  No chest pain or palpitations.  No cough or wheezing.  No bleeding diathesis.  Pt admitted with closed lt hip fx s/p mechanical fall.   Per orthopedics notes, plan for total hip replacement. Pt currently NPO for procedure.   Pt unavailable at time of visit. Pt down in OR at time of procedure. RD unable to obtain further nutrition-related history or complete nutrition-focused physical exam at this time.    Reviewed wt hx; pt has experienced a 4.3% wt loss over the past month, which is not significant for time frame.   Pt with increased nutritional needs for post-operative healing and would benefit from addition of oral nutrition supplements.   Medications reviewed and include vitamin C, vitamin D, zinc sulfate, lactated  ringers infusion @ 75 ml/hr, and remdesivir.   Labs reviewed: Na: 129.    Diet Order:   Diet Order             Diet NPO time specified  Diet effective now                   EDUCATION NEEDS:   No education needs have been identified at this time  Skin:  Skin Assessment: Skin Integrity Issues: Skin Integrity Issues:: Incisions Incisions: closed rt hip  Last BM:  Unknown  Height:   Ht Readings from Last 1 Encounters:  11/28/21 5\' 3"  (1.6 m)    Weight:   Wt Readings from Last 1 Encounters:  11/28/21 74.4 kg    Ideal Body Weight:  52.3 kg  BMI:  Body mass index is 29.05 kg/m.  Estimated Nutritional Needs:   Kcal:  2000-2200  Protein:  90-105 grams  Fluid:  > 2 L    Loistine Chance, RD, LDN, Bee Registered Dietitian II Certified Diabetes Care and Education Specialist Please refer to [facility] for RD and/or RD on-call/weekend/after hours pager

## 2021-11-29 NOTE — Consult Note (Signed)
Reason for Consult: Right femoral neck fracture Referring Physician: Dr. Arville Care  HPI:  Gabrielle Salinas is an 71 y.o. female.  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  She slipped in her kitchen and fell injuring her right hip.  No prodromal symptoms and no chronic right hip complaints.  Past Medical History:  Diagnosis Date   Allergy    Anxiety    Cataract    Depression    GERD (gastroesophageal reflux disease)    Hyperlipidemia    Hypertension    Osteoporosis     Past Surgical History:  Procedure Laterality Date   BREAST BIOPSY Right <10 yrs ago   x2 benign results   CATARACT EXTRACTION, BILATERAL  09/2019   COLONOSCOPY  11/19/2017   GI Bleed  2008   RHINOPLASTY     UPPER GASTROINTESTINAL ENDOSCOPY  10/2017   [facility]   VAGINAL HYSTERECTOMY  1982   due to abnormal pap smears and menorrhagia    Family History  Problem Relation Age of Onset   Lupus Mother    Heart attack Father    Diabetes Father    Diabetes Sister    Diabetes Sister    Diabetes Sister    Breast cancer Neg Hx     Social History:  reports that she has never smoked. She has never used smokeless tobacco. She reports current alcohol use. She reports that she does not use drugs.  Allergies:  Allergies  Allergen Reactions   Bactrim [Sulfamethoxazole-Trimethoprim] Itching   Bee Venom Swelling   Daypro  [Oxaprozin]    Sulfa Antibiotics Diarrhea   Sulfasalazine     Medications: I have reviewed the patient's current medications.  Results for orders placed or performed during the hospital encounter of 11/28/21 (from the past 48 hour(s))  CBC     Status: None   Collection Time: 11/29/21 12:32 AM  Result Value Ref Range   WBC 9.6 4.0 - 10.5 K/uL   RBC 4.33 3.87 - 5.11 MIL/uL   Hemoglobin 14.1 12.0 - 15.0 g/dL   HCT 25.8 52.7 - 78.2 %   MCV 95.2 80.0 - 100.0 fL   MCH 32.6 26.0 - 34.0 pg   MCHC 34.2 30.0 - 36.0 g/dL    RDW 42.3 53.6 - 14.4 %   Platelets 204 150 - 400 K/uL   nRBC 0.0 0.0 - 0.2 %    Comment: Performed at [facility], [address] [place], [place] [address]  Basic metabolic panel     Status: Abnormal   Collection Time: 11/29/21 12:32 AM  Result Value Ref Range   Sodium 129 (L) 135 - 145 mmol/L   Potassium 3.5 3.5 - 5.1 mmol/L    Comment: HEMOLYSIS AT THIS LEVEL MAY AFFECT RESULT   Chloride 96 (L) 98 - 111 mmol/L   CO2 24 22 - 32 mmol/L   Glucose, Bld 139 (H) 70 - 99 mg/dL    Comment: Glucose reference range applies only to samples taken after fasting for at least 8 hours.   BUN 13 8 - 23 mg/dL   Creatinine, Ser 0.86 0.44 - 1.00 mg/dL   Calcium 8.3 (L) 8.9 - 10.3 mg/dL   GFR, Estimated >76 >19 mL/min    Comment: (NOTE) Calculated using the CKD-EPI Creatinine Equation (2021)    Anion gap 9 5 - 15    Comment: Performed at [facility], [address]  [place], [place] [address]  Protime-INR     Status: None   Collection Time: 11/29/21 12:32 AM  Result Value Ref Range   Prothrombin Time 14.3 11.4 - 15.2 seconds   INR 1.1 0.8 - 1.2    Comment: (NOTE) INR goal varies based on device and disease states. Performed at [facility], [address] [place], [place] [address]   Type and screen [facility]     Status: None (Preliminary result)   Collection Time: 11/29/21 12:32 AM  Result Value Ref Range   ABO/RH(D) PENDING    Antibody Screen PENDING    Sample Expiration      12/02/2021,2359 Performed at [facility], [address] [place], [place] [address]   Resp Panel by RT-PCR (Flu A&B, Covid) Nasopharyngeal Swab     Status: Abnormal   Collection Time: 11/29/21 12:32 AM   Specimen: Nasopharyngeal Swab; Nasopharyngeal(NP) swabs in vial transport medium  Result Value Ref Range   SARS Coronavirus 2 by RT PCR POSITIVE (A) NEGATIVE    Comment: (NOTE) SARS-CoV-2 target nucleic acids are DETECTED.  The SARS-CoV-2 RNA  is generally detectable in upper respiratory specimens during the acute phase of infection. Positive results are indicative of the presence of the identified virus, but do not rule out bacterial infection or co-infection with other pathogens not detected by the test. Clinical correlation with patient history and other diagnostic information is necessary to determine patient infection status. The expected result is Negative.  Fact Sheet for Patients: BloggerCourse.comhttps://www.fda.gov/media/152166/download  Fact Sheet for Healthcare Providers: SeriousBroker.ithttps://www.fda.gov/media/152162/download  This test is not yet approved or cleared by the [place] FDA and  has been authorized for detection and/or diagnosis of SARS-CoV-2 by FDA under an Emergency Use Authorization (EUA).  This EUA will remain in effect (meaning this test can be used) for the duration of  the COVID-19 declaration under Section 564(b)(1) of the A ct, 21 U.S.C. section 360bbb-3(b)(1), unless the authorization is terminated or revoked sooner.     Influenza A by PCR NEGATIVE NEGATIVE   Influenza B by PCR NEGATIVE NEGATIVE    Comment: (NOTE) The Xpert Xpress SARS-CoV-2/FLU/RSV plus assay is intended as an aid in the diagnosis of influenza from Nasopharyngeal swab specimens and should not be used as a sole basis for treatment. Nasal washings and aspirates are unacceptable for Xpert Xpress SARS-CoV-2/FLU/RSV testing.  Fact Sheet for Patients: BloggerCourse.comhttps://www.fda.gov/media/152166/download  Fact Sheet for Healthcare Providers: SeriousBroker.ithttps://www.fda.gov/media/152162/download  This test is not yet approved or cleared by the [place] FDA and has been authorized for detection and/or diagnosis of SARS-CoV-2 by FDA under an Emergency Use Authorization (EUA). This EUA will remain in effect (meaning this test can be used) for the duration of the COVID-19 declaration under Section 564(b)(1) of the Act, 21 U.S.C. section 360bbb-3(b)(1), unless the  authorization is terminated or revoked.  Performed at [facility], [address] [place], [place] [address]     DG Chest 1 View  Result Date: 11/28/2021 CLINICAL DATA:  Status post fall EXAM: CHEST  1 VIEW COMPARISON:  None. FINDINGS: The heart and mediastinal contours are unchanged. No focal consolidation. No pulmonary edema. No pleural effusion. No pneumothorax. No acute osseous abnormality. Dextroscoliosis of the mid to lower thoracic spine. IMPRESSION: No active disease. Electronically Signed   By: Tish FredericksonMorgane  Naveau M.D.   On: 11/28/2021 23:30   DG Hip Unilat  With Pelvis 2-3 Views Right  Result Date: 11/28/2021 CLINICAL DATA:  Right hip pain. EXAM: DG  HIP (WITH OR WITHOUT PELVIS) 2-3V RIGHT COMPARISON:  None. FINDINGS: Marked severity foreshortening of the right femoral neck is seen. There is no evidence of dislocation. Sclerotic changes are seen involving the symphysis pubis. IMPRESSION: Acute fracture of the right femoral neck. Electronically Signed   By: Aram Candela M.D.   On: 11/28/2021 23:32    Review of Systems Blood pressure (!) 162/73, pulse 85, temperature 97.7 F (36.5 C), temperature source Oral, resp. rate (!) 22, height 5\' 3"  (1.6 m), weight 74.4 kg, last menstrual period 11/27/1980, SpO2 92 %. Physical Exam Neurovascular intact distally on the right lower extremity with palpable pulses.  Skin is intact about the right hip with shortening and external rotation of the right leg Assessment/Plan: Displaced femoral neck fracture right hip in an active 71 year old with mild arthritis Plan is for total hip replacement due to her level of activity.  Brochure given to her regarding surgery.  61 11/29/2021, 7:11 AM

## 2021-11-29 NOTE — Op Note (Signed)
11/29/2021  5:14 PM  PATIENT:  Gabrielle Salinas  71 y.o. female  PRE-OPERATIVE DIAGNOSIS:  Right hip fracture and osteoarthritis  POST-OPERATIVE DIAGNOSIS:  Right hip fracture and osteoarthritis  PROCEDURE:  Procedure(s): TOTAL HIP ARTHROPLASTY ANTERIOR APPROACH (Right)  SURGEON: Leitha Schuller, MD  ASSISTANTS: None  ANESTHESIA:   spinal  EBL:  Total I/O In: 710 [I.V.:700; IV Piggyback:10] Out: 1250 [Urine:1200; Blood:50]  BLOOD ADMINISTERED:none  DRAINS:  Incisional wound VAC    LOCAL MEDICATIONS USED:  MARCAINE    and OTHER Exparel  SPECIMEN: Right femoral head and neck fragments  DISPOSITION OF SPECIMEN:  PATHOLOGY  COUNTS:  YES  TOURNIQUET:  * No tourniquets in log *  IMPLANTS: Medacta AMIS 4 standard stem, 50 mm Mpact TM cup and liner with S 28 mm head metal  DICTATION: .Dragon Dictation   The patient was brought to the operating room and after spinal anesthesia was obtained patient was placed on the operative table with the ipsilateral foot into the Medacta attachment, contralateral leg on a well-padded table. C-arm was brought in and preop template x-ray taken. After prepping and draping in usual sterile fashion appropriate patient identification and timeout procedures were completed. Anterior approach to the hip was obtained and centered over the greater trochanter and TFL muscle. The subcutaneous tissue was incised hemostasis being achieved by electrocautery. TFL fascia was incised and the muscle retracted laterally deep retractor placed. The lateral femoral circumflex vessels were identified and ligated. The anterior capsule was exposed and a capsulotomy performed.  Comminuted femoral neck fracture identified.  The neck was identified and a femoral neck cut carried out with a saw. The head was removed without difficulty and showed sclerotic femoral head and acetabulum. Reaming was carried out to 50 mm and a 50 mm cup trial gave appropriate tightness to the acetabular  component a 50 DM cup was impacted into position. The leg was then externally rotated and ischiofemoral and pubofemoral releases carried out. The femur was sequentially broached to a size 4, size 4 standard with S head trials were placed and the final components chosen. The 4 standard stem was inserted along with a metal S 28 mm head and 50 mm liner. The hip was reduced and was stable the wound was thoroughly irrigated with fibrillar placed along the posterior capsule and medial neck. The deep fascia ws closed using a heavy Quill after infiltration of 30 cc of quarter percent Sensorcaine with epinephrine along with dilute Exparel throughout the case .3-0 V-loc to close the skin with skin staples.  Incisional wound VAC applied and patient was sent to recovery in stable condition.   PLAN OF CARE: Admit to inpatient

## 2021-11-29 NOTE — Anesthesia Preprocedure Evaluation (Addendum)
Anesthesia Evaluation  Patient identified by MRN, date of birth, ID band Patient awake    Reviewed: Allergy & Precautions, NPO status , Patient's Chart, lab work & pertinent test results  History of Anesthesia Complications Negative for: history of anesthetic complications  Airway Mallampati: I   Neck ROM: Full    Dental no notable dental hx.    Pulmonary  COVID+ 11/29/21, minimal symptoms   Pulmonary exam normal breath sounds clear to auscultation       Cardiovascular hypertension, Pt. on medications Normal cardiovascular exam Rhythm:Regular Rate:Normal  ECG 11/29/21: normal   Neuro/Psych PSYCHIATRIC DISORDERS Anxiety Depression negative neurological ROS     GI/Hepatic GERD  Medicated and Controlled,  Endo/Other  negative endocrine ROS  Renal/GU negative Renal ROS     Musculoskeletal   Abdominal   Peds negative pediatric ROS (+)  Hematology negative hematology ROS (+)   Anesthesia Other Findings Right hip fracture  Reproductive/Obstetrics                            Anesthesia Physical Anesthesia Plan  ASA: 2 and emergent  Anesthesia Plan: Spinal and General   Post-op Pain Management:    Induction: Intravenous  PONV Risk Score and Plan: 3 and Propofol infusion, TIVA, Treatment may vary due to age or medical condition and Ondansetron  Airway Management Planned: Natural Airway  Additional Equipment:   Intra-op Plan:   Post-operative Plan:   Informed Consent: I have reviewed the patients History and Physical, chart, labs and discussed the procedure including the risks, benefits and alternatives for the proposed anesthesia with the patient or authorized representative who has indicated his/her understanding and acceptance.       Plan Discussed with: CRNA  Anesthesia Plan Comments: (Plan for spinal and GA with natural airway, LMA/GETA backup.  Patient consented for risks of  anesthesia including but not limited to:  - adverse reactions to medications - damage to eyes, teeth, lips or other oral mucosa - nerve damage due to positioning  - sore throat or hoarseness - headache, bleeding, infection, nerve damage 2/2 spinal - damage to heart, brain, nerves, lungs, other parts of body or loss of life  Informed patient about role of CRNA in peri- and intra-operative care.  Patient voiced understanding.)        Anesthesia Quick Evaluation

## 2021-11-29 NOTE — Anesthesia Procedure Notes (Signed)
Spinal  Patient location during procedure: OR Start time: 11/29/2021 3:40 PM End time: 11/29/2021 3:44 PM Reason for block: surgical anesthesia Staffing Performed: resident/CRNA  Anesthesiologist: Darrin Nipper, MD Resident/CRNA: Lia Foyer, CRNA Preanesthetic Checklist Completed: patient identified, IV checked, site marked, risks and benefits discussed, surgical consent, monitors and equipment checked, pre-op evaluation and timeout performed Spinal Block Patient position: left lateral decubitus Prep: ChloraPrep Patient monitoring: heart rate, cardiac monitor, continuous pulse ox and blood pressure Approach: midline Location: L3-4 Injection technique: single-shot Needle Needle type: Pencan  Needle gauge: 25 G Needle length: 9 cm Assessment Sensory level: T4 Events: CSF return

## 2021-11-29 NOTE — H&P (Addendum)
[place]   PATIENT NAME: Gabrielle Salinas    MR#:  [id number]  DATE OF BIRTH:  1951/07/04  DATE OF ADMISSION:  11/28/2021  PRIMARY CARE PHYSICIAN: Birdie Sons, MD   Patient is coming from: Home  REQUESTING/REFERRING PHYSICIAN: Rudene Re, MD  CHIEF COMPLAINT:   Chief Complaint  Patient presents with   Fall   Hip Pain    HISTORY OF PRESENT ILLNESS:  Gabrielle Salinas is a 71 y.o. Caucasian female with medical history significant for anxiety, depression, GERD, dyslipidemia, hypertension, prediabetes, macular degeneration and osteoporosis, who presented to the ER with acute onset of left hip pain after having an accidental mechanical fall.  The patient tripped over her shoes and fell on her right side.  She had subsequent inability to ambulate due to pain with any movement of the right hip.  No presyncope or syncope headache or dizziness or blurred vision.  No paresthesias or focal muscle weakness.  No chest pain or palpitations.  No cough or wheezing.  No bleeding diathesis.  ED Course: Upon presentation to the ER, vital signs were within normal.  Labs reveal hyponatremia of 129 hypochloremia 96 and CBC was within normal.  COVID-19 PCR came back positive and influenza antigens were negative. EKG as reviewed by me : EKG showed sinus rhythm with rate of 87. Imaging: Portable chest ray showed no acute cardiopulmonary disease.  Right hip x-ray shows acute right femoral neck fracture.  Patient was given 50 mcg of IV fentanyl and hydration with IV lactated Ringer.  She will be admitted to a medical monitored bed for further evaluation and management PAST MEDICAL HISTORY:   Past Medical History:  Diagnosis Date   Allergy    Anxiety    Cataract    Depression    GERD (gastroesophageal reflux disease)    Hyperlipidemia    Hypertension    Osteoporosis     PAST SURGICAL HISTORY:   Past Surgical History:  Procedure Laterality Date   BREAST BIOPSY Right <10 yrs ago    x2 benign results   CATARACT EXTRACTION, BILATERAL  09/2019   COLONOSCOPY  11/19/2017   GI Bleed  2008   RHINOPLASTY     UPPER GASTROINTESTINAL ENDOSCOPY  10/2017   [facility]   VAGINAL HYSTERECTOMY  1982   due to abnormal pap smears and menorrhagia    SOCIAL HISTORY:   Social History   Tobacco Use   Smoking status: Never   Smokeless tobacco: Never  Substance Use Topics   Alcohol use: Yes    Alcohol/week: 0.0 standard drinks    Comment: 1glass of wine on special occasions    FAMILY HISTORY:   Family History  Problem Relation Age of Onset   Lupus Mother    Heart attack Father    Diabetes Father    Diabetes Sister    Diabetes Sister    Diabetes Sister    Breast cancer Neg Hx     DRUG ALLERGIES:   Allergies  Allergen Reactions   Bactrim [Sulfamethoxazole-Trimethoprim] Itching   Bee Venom Swelling   Daypro  [Oxaprozin]    Sulfa Antibiotics Diarrhea   Sulfasalazine     REVIEW OF SYSTEMS:   ROS As per history of present illness. All pertinent systems were reviewed above. Constitutional, HEENT, cardiovascular, respiratory, GI, GU, musculoskeletal, neuro, psychiatric, endocrine, integumentary and hematologic systems were reviewed and are otherwise negative/unremarkable except for positive findings mentioned above in the HPI.   MEDICATIONS AT HOME:  Prior to Admission medications   Medication Sig Start Date End Date Taking? Authorizing Provider  acetaminophen (TYLENOL) 325 MG tablet Take 650 mg by mouth every 4 (four) hours as needed. 11/19/17   [provider]  alendronate (FOSAMAX) 70 MG tablet TAKE 1 TABLET BY MOUTH EVERY WEEK WITH A FULL GLASS OF WATER AND ON AN EMPTY STOMACH 10/13/20   Birdie Sons, MD  ALPRAZolam (XANAX) 0.25 MG tablet TAKE 1 TO 2 TABLETS BY MOUTH EVERY 4 HOURS AS NEEDED FOR ANXIETY. DO NOT EXCEED 4 TABLETS DAILY 10/12/21   Birdie Sons, MD  Ascorbic Acid (VITAMIN C) 100 MG tablet Take 100 mg by mouth daily.    [provider]  azelastine (ASTELIN) 0.1 % nasal spray Place 2 sprays into both nostrils 2 (two) times daily. Use in each nostril as directed 11/10/20   Birdie Sons, MD  Calcium Carb-Cholecalciferol 600-500 MG-UNIT CAPS Take 600 Units by mouth daily. 11/19/17   [provider]  cholecalciferol (VITAMIN D) 1000 units tablet Take 1 tablet by mouth daily. Patient not taking: Reported on 10/31/2021    [provider]  clorazepate (TRANXENE) 15 MG tablet TAKE 1/2 TABLET BY MOUTH THREE TIMES DAILY AS NEEDED 11/25/21   Birdie Sons, MD  doxycycline (ADOXA) 100 MG tablet Take 100 mg by mouth 2 (two) times daily. 11/15/21   [provider]  esomeprazole (NEXIUM) 40 MG capsule TAKE 1 CAPSULE BY MOUTH EVERY DAY AT NOON 10/19/21   Birdie Sons, MD  ezetimibe (ZETIA) 10 MG tablet TAKE 1 TABLET(10 MG) BY MOUTH DAILY 05/11/21   Birdie Sons, MD  FLUoxetine (PROZAC) 20 MG capsule TAKE 1 CAPSULE(20 MG) BY MOUTH DAILY 11/01/21   Birdie Sons, MD  hydrochlorothiazide (HYDRODIURIL) 12.5 MG tablet TAKE 1 TABLET(12.5 MG) BY MOUTH DAILY 11/10/21   Birdie Sons, MD  ibuprofen (ADVIL) 600 MG tablet Take 600 mg by mouth every 6 (six) hours as needed. Patient not taking: Reported on 10/31/2021 07/28/21   [provider]  lisinopril (ZESTRIL) 10 MG tablet TAKE 1 TABLET(10 MG) BY MOUTH DAILY 11/29/20   Birdie Sons, MD  Misc Natural Products (JOINT SUPPORT PO) Take by mouth daily.     [provider]  montelukast (SINGULAIR) 10 MG tablet TAKE 1 TABLET BY MOUTH EVERY DAY FOR ALLERGIES 09/28/21   Birdie Sons, MD  Multiple Vitamins-Minerals (MULTIVITAMIN ADULT PO) Take 1 tablet by mouth daily.    [provider]  Multiple Vitamins-Minerals (PRESERVISION AREDS 2 PO) Take 1 tablet by mouth 2 (two) times daily.    [provider]  Omega-3 Fatty Acids (FISH OIL) 1000 MG CAPS Take 1 capsule by mouth daily.     [provider]  phentermine  30 MG capsule TAKE 1 CAPSULE(30 MG) BY MOUTH EVERY MORNING 09/29/21   Birdie Sons, MD  topiramate (TOPAMAX) 50 MG tablet Take 1 tablet (50 mg total) by mouth 2 (two) times daily. 09/13/21   Birdie Sons, MD  triamcinolone cream (KENALOG) 0.5 % Apply 1 application topically daily as needed. 08/03/20   Birdie Sons, MD  TURMERIC PO Take by mouth daily.     [provider]  vitamin E 1000 UNIT capsule Take 1,000 Units by mouth daily.     [provider]      VITAL SIGNS:  Blood pressure (!) 171/74, pulse 87, temperature 97.7 F (36.5 C), temperature source Oral, resp. rate 18, height 5\' 3"  (  1.6 m), weight 74.4 kg, last menstrual period 11/27/1980, SpO2 98 %.  PHYSICAL EXAMINATION:  Physical Exam  GENERAL:  71 y.o.-year-old Caucasian female patient lying in the bed with no acute distress.  EYES: Pupils equal, round, reactive to light and accommodation. No scleral icterus. Extraocular muscles intact.  HEENT: Head atraumatic, normocephalic. Oropharynx and nasopharynx clear.  NECK:  Supple, no jugular venous distention. No thyroid enlargement, no tenderness.  LUNGS: Normal breath sounds bilaterally, no wheezing, rales,rhonchi or crepitation. No use of accessory muscles of respiration.  CARDIOVASCULAR: Regular rate and rhythm, S1, S2 normal. No murmurs, rubs, or gallops.  ABDOMEN: Soft, nondistended, nontender. Bowel sounds present. No organomegaly or mass.  EXTREMITIES: No pedal edema, cyanosis, or clubbing.  NEUROLOGIC: Cranial nerves II through XII are intact. Muscle strength 5/5 in all extremities. Sensation intact. Gait not checked. Musculoskeletal: Left hip lateral tenderness. PSYCHIATRIC: The patient is alert and oriented x 3.  Normal affect and good eye contact. SKIN: No obvious rash, lesion, or ulcer.   LABORATORY PANEL:   CBC Recent Labs  Lab 11/29/21 0032  WBC 9.6  HGB 14.1  HCT 41.2  PLT 204    ------------------------------------------------------------------------------------------------------------------  Chemistries  Recent Labs  Lab 11/29/21 0032  NA 129*  K 3.5  CL 96*  CO2 24  GLUCOSE 139*  BUN 13  CREATININE 0.72  CALCIUM 8.3*   ------------------------------------------------------------------------------------------------------------------  Cardiac Enzymes No results for input(s): TROPONINI in the last 168 hours. ------------------------------------------------------------------------------------------------------------------  RADIOLOGY:  DG Chest 1 View  Result Date: 11/28/2021 CLINICAL DATA:  Status post fall EXAM: CHEST  1 VIEW COMPARISON:  None. FINDINGS: The heart and mediastinal contours are unchanged. No focal consolidation. No pulmonary edema. No pleural effusion. No pneumothorax. No acute osseous abnormality. Dextroscoliosis of the mid to lower thoracic spine. IMPRESSION: No active disease. Electronically Signed   By: Iven Finn M.D.   On: 11/28/2021 23:30   DG Hip Unilat  With Pelvis 2-3 Views Right  Result Date: 11/28/2021 CLINICAL DATA:  Right hip pain. EXAM: DG HIP (WITH OR WITHOUT PELVIS) 2-3V RIGHT COMPARISON:  None. FINDINGS: Marked severity foreshortening of the right femoral neck is seen. There is no evidence of dislocation. Sclerotic changes are seen involving the symphysis pubis. IMPRESSION: Acute fracture of the right femoral neck. Electronically Signed   By: Virgina Norfolk M.D.   On: 11/28/2021 23:32      IMPRESSION AND PLAN:  Principal Problem:   Closed right hip fracture (HCC)  1.  Closed left hip fracture secondary to mechanical fall. - The patient will be admitted to a medical monitored bed. - Pain management will be provided. - Orthopedic consultation will be obtained. - Dr. Rudene Christians was notified and is aware about the patient. - Case management consult will be obtained for likely need of SNF placement at discharge. - The  patient has a history of CHF, diabetes mellitus on insulin, CVA, coronary artery disease or renal failure.  She is considered at average risk for her age for perioperative cardiovascular events per the revised cardiac risk index.  She has no current pulmonary issues.  2.  Hyponatremia. - The patient will be hydrated with IV normal saline and will follow her BMP.  3.  COVID-19 infection. - We will place her on IV remdesivir. - Vitamin D3, vitamin C and zinc sulfate will be provided.  4.  Anxiety and depression. - We will continue Xanax and Prozac.  5.  Essential hypertension. - We will continue Zestril and HCTZ.  6.  Dyslipidemia. - We will continue Zetia and Lovaza.  7.  Allergic rhinitis. - We will continue Singulair.  DVT prophylaxis: CDs.  Medical prophylaxis currently postponed till postoperative period. Code Status: full code. Family Communication:  The plan of care was discussed in details with the patient (and family). I answered all questions. The patient agreed to proceed with the above mentioned plan. Further management will depend upon hospital course. Disposition Plan: Back to previous home environment Consults called: Orthopedic consult.   All the records are reviewed and case discussed with ED provider.  Status is: Inpatient  At the time of the admission, it appears that the appropriate admission status for this patient is inpatient.  This is judged to be reasonable and necessary in order to provide the required intensity of service to ensure the patient's safety given the presenting symptoms, physical exam findings and initial radiographic and laboratory data in the context of comorbid conditions.  The patient requires inpatient status due to high intensity of service, high risk of further deterioration and high frequency of surveillance required.  I certify that at the time of admission, it is my clinical judgment that the patient will require inpatient hospital care  extending more than 2 midnights.                            Dispo: The patient is from: Home              Anticipated d/c is to: Home              Patient currently is not medically stable to d/c.              Difficult to place patient: No    Christel Mormon M.D on 11/29/2021 at 3:43 AM  [facility]   From 7 PM-7 AM, contact night-coverage [email or web address]  CC: Primary care physician; Birdie Sons, MD

## 2021-11-29 NOTE — ED Notes (Signed)
Surgeon in room to speak to pt.

## 2021-11-29 NOTE — Anesthesia Postprocedure Evaluation (Signed)
Anesthesia Post Note  Patient: Gabrielle Salinas  Procedure(s) Performed: TOTAL HIP ARTHROPLASTY ANTERIOR APPROACH (Right: Hip)  Patient location during evaluation: Other Anesthesia Type: Spinal and General Level of consciousness: awake and alert, oriented and patient cooperative Pain management: pain level controlled Vital Signs Assessment: post-procedure vital signs reviewed and stable Respiratory status: spontaneous breathing and respiratory function stable Cardiovascular status: blood pressure returned to baseline and stable Postop Assessment: no headache, adequate PO intake and no apparent nausea or vomiting Anesthetic complications: no   No notable events documented.   Last Vitals:  Vitals:   11/29/21 1152 11/29/21 1751  BP: (!) 161/68 (!) 110/58  Pulse: 88 83  Resp: 18 16  Temp: 37.3 C   SpO2: 96% 94%    Last Pain:  Vitals:   11/29/21 1106  TempSrc:   PainSc: 10-Worst pain ever                 Reed Breech

## 2021-11-29 NOTE — Progress Notes (Signed)
Patient seen and examined.  Admitted by nighttime hospitalist early morning hours with multiple medical issues as well as right hip fracture after accidental mechanical fall at home.  She tripped over her shoes and fell on her right side.  Patient also found to have COVID-19 positivity.  She does have some cough and congestion but no hypoxemia.  Patient was complaining of right hip pain.  Received pain medications.  Going for right total hip today with Dr. Rosita Kea.  Postop management as per surgery. Minimally symptomatic COVID-19 virus infection, patient on room air.  She does have multiple medical problems.  Reasonable to continue IV remdesivir that was started in the emergency room. Continue other supportive care.  Resume home medications.   Total time spent: 20 minutes.  No charge visit.  Same-day admission.

## 2021-11-29 NOTE — Transfer of Care (Signed)
Immediate Anesthesia Transfer of Care Note  Patient: Gabrielle Salinas  Procedure(s) Performed: TOTAL HIP ARTHROPLASTY ANTERIOR APPROACH (Right: Hip)  Patient Location: Nursing Unit  Anesthesia Type:General and Spinal  Level of Consciousness: awake, alert  and patient cooperative  Airway & Oxygen Therapy: Patient Spontanous Breathing  Post-op Assessment: Report given to RN and Post -op Vital signs reviewed and stable  Post vital signs: Reviewed and stable  Last Vitals:  Vitals Value Taken Time  BP 110/58 11/29/21 1751  Temp    Pulse 83 11/29/21 1751  Resp 16 11/29/21 1751  SpO2 94 % 11/29/21 1751    Last Pain:  Vitals:   11/29/21 1106  TempSrc:   PainSc: 10-Worst pain ever         Complications: No notable events documented.

## 2021-11-30 ENCOUNTER — Encounter: Payer: Self-pay | Admitting: Orthopedic Surgery

## 2021-11-30 LAB — COMPREHENSIVE METABOLIC PANEL
ALT: 28 U/L (ref 0–44)
AST: 31 U/L (ref 15–41)
Albumin: 2.8 g/dL — ABNORMAL LOW (ref 3.5–5.0)
Alkaline Phosphatase: 92 U/L (ref 38–126)
Anion gap: 11 (ref 5–15)
BUN: 16 mg/dL (ref 8–23)
CO2: 26 mmol/L (ref 22–32)
Calcium: 7.8 mg/dL — ABNORMAL LOW (ref 8.9–10.3)
Chloride: 93 mmol/L — ABNORMAL LOW (ref 98–111)
Creatinine, Ser: 0.75 mg/dL (ref 0.44–1.00)
GFR, Estimated: >60 mL/min (ref >60)
Glucose, Bld: 167 mg/dL — ABNORMAL HIGH (ref 70–99)
Potassium: 3 mmol/L — ABNORMAL LOW (ref 3.5–5.1)
Sodium: 130 mmol/L — ABNORMAL LOW (ref 135–145)
Total Bilirubin: 0.5 mg/dL (ref 0.3–1.2)
Total Protein: 5.8 g/dL — ABNORMAL LOW (ref 6.5–8.1)

## 2021-11-30 LAB — CBC WITH DIFFERENTIAL/PLATELET
Abs Immature Granulocytes: 0.04 10*3/uL (ref 0.00–0.07)
Basophils Absolute: 0 10*3/uL (ref 0.0–0.1)
Basophils Relative: 1 %
Eosinophils Absolute: 0 10*3/uL (ref 0.0–0.5)
Eosinophils Relative: 0 %
HCT: 31.7 % — ABNORMAL LOW (ref 36.0–46.0)
Hemoglobin: 10.6 g/dL — ABNORMAL LOW (ref 12.0–15.0)
Immature Granulocytes: 1 %
Lymphocytes Relative: 20 %
Lymphs Abs: 1.7 10*3/uL (ref 0.7–4.0)
MCH: 31.7 pg (ref 26.0–34.0)
MCHC: 33.4 g/dL (ref 30.0–36.0)
MCV: 94.9 fL (ref 80.0–100.0)
Monocytes Absolute: 0.8 10*3/uL (ref 0.1–1.0)
Monocytes Relative: 10 %
Neutro Abs: 5.9 10*3/uL (ref 1.7–7.7)
Neutrophils Relative %: 68 %
Platelets: 170 10*3/uL (ref 150–400)
RBC: 3.34 MIL/uL — ABNORMAL LOW (ref 3.87–5.11)
RDW: 13.2 % (ref 11.5–15.5)
WBC: 8.5 10*3/uL (ref 4.0–10.5)
nRBC: 0 % (ref 0.0–0.2)

## 2021-11-30 LAB — C-REACTIVE PROTEIN: CRP: 13.7 mg/dL — ABNORMAL HIGH (ref <1.0)

## 2021-11-30 LAB — D-DIMER, QUANTITATIVE: D-Dimer, Quant: 11.43 ug/mL-FEU — ABNORMAL HIGH (ref 0.00–0.50)

## 2021-11-30 LAB — FERRITIN: Ferritin: 106 ng/mL (ref 11–307)

## 2021-11-30 MED ORDER — POTASSIUM CHLORIDE CRYS ER 20 MEQ PO TBCR
40.0000 meq | EXTENDED_RELEASE_TABLET | Freq: Two times a day (BID) | ORAL | Status: AC
  Administered 2021-11-30 – 2021-12-01 (×4): 40 meq via ORAL
  Filled 2021-11-30 (×6): qty 2

## 2021-11-30 NOTE — Progress Notes (Signed)
Physical Therapy Treatment Patient Details Name: Gabrielle Salinas MRN: [id number] DOB: 1951-08-15 Today's Date: 11/30/2021   History of Present Illness Gabrielle Salinas is a 71 y.o. Caucasian female with medical history significant for anxiety, depression, GERD, dyslipidemia, hypertension, prediabetes, macular degeneration and osteoporosis, who presented to the ER with acute onset of left hip pain after having an accidental mechanical fall. S/p R THA on 11/29/21, anterior THPs    PT Comments    Pt was long sitting in bed upon arriving. She reports she feels a little better since she was able to nap." I did not sleep at all last night." BP in supine 127/65 (73). Upon sitting up EOB with mod assist for RLE progression, BP 109/73 (86). She stood several times EOB prior to ambulating ~ 8 ft with RW. Pain severely limits pain. Will treat pt tomorrow when closer to pain medication being issued. Overall she is progressing but slowly. Recommend SNF to address deficits while assisting pt to PLOF. Son entered room at conclusion of session. Will see pt BID until DC.    Recommendations for follow up therapy are one component of a multi-disciplinary discharge planning process, led by the attending physician.  Recommendations may be updated based on patient status, additional functional criteria and insurance authorization.  Follow Up Recommendations  Skilled nursing-short term rehab (<3 hours/day)     Assistance Recommended at Discharge PRN  Patient can return home with the following A lot of help with walking and/or transfers;Assistance with cooking/housework;Help with stairs or ramp for entrance   Equipment Recommendations  Rolling walker (2 wheels);BSC/3in1       Precautions / Restrictions Precautions Precautions: Fall;Anterior Hip Restrictions Weight Bearing Restrictions: Yes RLE Weight Bearing: Weight bearing as tolerated     Mobility  Bed Mobility Overal bed mobility: Needs Assistance Bed  Mobility: Supine to Sit     Supine to sit: Mod assist;HOB elevated     General bed mobility comments: increased time to perform. Mod assist most due to pain.    Transfers Overall transfer level: Needs assistance Equipment used: Rolling walker (2 wheels) Transfers: Sit to/from Stand Sit to Stand: Min assist      General transfer comment: pt stood 3 x EOB prior to ambulating ~ 8 ft with extremely slow shuffling step pattern. severely limited by pain. Vcs during transfers for handplacment, fwd wt shift, and overall technique improvements.    Ambulation/Gait Ambulation/Gait assistance: Min guard Gait Distance (Feet): 8 Feet Assistive device: Rolling walker (2 wheels) Gait Pattern/deviations: Shuffle;Trunk flexed;Step-to pattern;Decreased stance time - right;Decreased step length - left;Decreased stride length       General Gait Details: pain limiting distance. extremely poor gait sequencing due to pain     Balance Overall balance assessment: Needs assistance Sitting-balance support: No upper extremity supported;Feet supported Sitting balance-Leahy Scale: Fair     Standing balance support: Bilateral upper extremity supported Standing balance-Leahy Scale: Fair        Cognition Arousal/Alertness: Awake/alert Behavior During Therapy: WFL for tasks assessed/performed Overall Cognitive Status: Within Functional Limits for tasks assessed      General Comments: Pt is A and O x 4 is somewhat lethargic but overall able to consistently follow commands and perform desired task requested.        Exercises Other Exercises Other Exercises: Educated in role of PT and progressive mobility, WBing precautions (WBAT) and functional implications, importance of BP/orthostatic hypotension management; patient voiced understanding.  Will continue to reinforce. Other Exercises: Seated LE therex, 1x10, active  ROM: ankle pumps, quad sets, hip abduct/adduct and heel slides (LEs elevated in  recliner); LAQs (short-sitting in chair)        Pertinent Vitals/Pain Pain Assessment: No/denies pain Pain Score: 0-No pain Faces Pain Scale: Hurts even more Pain Location: R hip Pain Descriptors / Indicators: Aching;Grimacing;Guarding Pain Intervention(s): Limited activity within patient's tolerance;Monitored during session;Premedicated before session;Repositioned;Ice applied    Home Living Family/patient expects to be discharged to:: Private residence Living Arrangements: Alone Available Help at Discharge: Family;Available PRN/intermittently;Neighbor Type of Home: Apartment Home Access: Level entry       Home Layout: One level            PT Goals (current goals can now be found in the care plan section) Acute Rehab PT Goals Patient Stated Goal: get better so I can go home. PT Goal Formulation: With patient/family Time For Goal Achievement: 12/14/21 Potential to Achieve Goals: Good Progress towards PT goals: Progressing toward goals    Frequency    BID      PT Plan Current plan remains appropriate       AM-PAC PT "6 Clicks" Mobility   Outcome Measure  Help needed turning from your back to your side while in a flat bed without using bedrails?: None Help needed moving from lying on your back to sitting on the side of a flat bed without using bedrails?: A Lot Help needed moving to and from a bed to a chair (including a wheelchair)?: A Lot Help needed standing up from a chair using your arms (e.g., wheelchair or bedside chair)?: A Lot Help needed to walk in hospital room?: A Lot Help needed climbing 3-5 steps with a railing? : A Lot 6 Click Score: 14    End of Session Equipment Utilized During Treatment: Gait belt Activity Tolerance: Patient tolerated treatment well Patient left: in chair;with call bell/phone within reach;with chair alarm set Nurse Communication: Mobility status PT Visit Diagnosis: Muscle weakness (generalized) (M62.81);Difficulty in  walking, not elsewhere classified (R26.2);Pain Pain - Right/Left: Right Pain - part of body: Hip     Time: 6295-2841 PT Time Calculation (min) (ACUTE ONLY): 29 min  Charges:  $Gait Training: 8-22 mins $Therapeutic Exercise: 8-22 mins $Therapeutic Activity: 8-22 mins                    Jetta Lout PTA 11/30/21, 5:40 PM

## 2021-11-30 NOTE — TOC Initial Note (Signed)
Transition of Care [facility]) - Initial/Assessment Note    Patient Details  Name: Anella Nakata MRN: [id number] Date of Birth: 1951-09-03  Transition of Care [facility]) CM/SW Contact:    Marlowe Sax, RN Phone Number: 11/30/2021, 8:58 AM  Clinical Narrative:                 Patient from home, PT to eval, TOC to assist with needs        Patient Goals and CMS Choice        Expected Discharge Plan and Services                                                Prior Living Arrangements/Services                       Activities of Daily Living Home Assistive Devices/Equipment: Eyeglasses ADL Screening (condition at time of admission) Patient's cognitive ability adequate to safely complete daily activities?: Yes Is the patient deaf or have difficulty hearing?: No Does the patient have difficulty seeing, even when wearing glasses/contacts?: No Does the patient have difficulty concentrating, remembering, or making decisions?: No Patient able to express need for assistance with ADLs?: Yes Does the patient have difficulty dressing or bathing?: No Independently performs ADLs?: Yes (appropriate for developmental age) Does the patient have difficulty walking or climbing stairs?: No Weakness of Legs: None Weakness of Arms/Hands: None  Permission Sought/Granted                  Emotional Assessment              Admission diagnosis:  Hyponatremia [E87.1] Fall [W19.XXXA] Closed right hip fracture (HCC) [S72.001A] Closed fracture of neck of right femur, initial encounter (HCC) [S72.001A] Fall, initial encounter [W19.XXXA] COVID-19 [U07.1] Patient Active Problem List   Diagnosis Date Noted   Closed right hip fracture (HCC) 11/29/2021   COVID-19 virus infection 11/29/2021   Macular degeneration, age related 08/03/2020   Insomnia 11/08/2018   Osteoporosis 04/20/2016   GERD (gastroesophageal reflux disease) 03/07/2016   Anxiety state 03/07/2016    Arthralgia 03/07/2016   Right knee pain 03/07/2016   Menopausal symptom 03/07/2016   Contact dermatitis 03/07/2016   Hemorrhage of gastrointestinal tract 10/17/2007   Cervicalgia 10/17/2007   HLD (hyperlipidemia) 03/21/2007   Prediabetes 03/21/2007   Allergic rhinitis due to pollen 11/27/2001   Depressive disorder 11/27/2001   Hypertension 11/27/1997   PCP:  Malva Limes, MD Pharmacy:   [id number] [place], [place] - [address] AT [facility] [address] [place] [address] Phone: [phone] Fax: [phone]  [facility]) - [place] - [address] [facility] [address] [place] [address] [place] [address] Phone: [phone] Fax: [phone]     Social Determinants of Health (SDOH) Interventions    Readmission Risk Interventions No flowsheet data found.

## 2021-11-30 NOTE — TOC Progression Note (Signed)
Transition of Care [facility]) - Progression Note    Patient Details  Name: Gabrielle Salinas MRN: [id number] Date of Birth: [birth year]  Transition of Care [facility]) CM/SW Contact  Marlowe Sax, RN Phone Number: 11/30/2021, 1:15 PM  Clinical Narrative:    The patient is Covid pos and can not go to a SNF for 10 days, on 1/13, she is agreeable to a bed search for STR, will do bed search when is closer to 1/13 then will need Ins approval        Expected Discharge Plan and Services                                                 Social Determinants of Health (SDOH) Interventions    Readmission Risk Interventions No flowsheet data found.

## 2021-11-30 NOTE — Evaluation (Addendum)
Occupational Therapy Evaluation Patient Details Name: Gabrielle Salinas MRN: [id number] DOB: April 30, 1951 Today's Date: 11/30/2021   History of Present Illness Gabrielle Salinas is a 71 y.o. Caucasian female with medical history significant for anxiety, depression, GERD, dyslipidemia, hypertension, prediabetes, macular degeneration and osteoporosis, who presented to the ER with acute onset of left hip pain after having an accidental mechanical fall. S/p R THA on 11/29/21   Clinical Impression   Gabrielle Salinas was seen for OT evaluation this date. Prior to hospital admission, pt was Independent in mobility and I/ADLs. Pt lives alone in level entry apartment with neighbors/children available. Pt presents to acute OT demonstrating impaired ADL performance and functional mobility 2/2 decreased activity tolerance and functional strength/ROM/balance deficits. Pt reports 9/10 pain at rest increasing to 10/10 with mobility. Seated BP 99/52, MAP 68, HR 98.  Pt currently requires MAX A don B socks seated EOB. MOD A to adjust underwear in standing, assist for pullling over rear and balance with single UE support. MOD A + RW for bed>chair t/f, +2 for lines mgmt. Pt would benefit from skilled OT to address noted impairments and functional limitations (see below for any additional details) in order to maximize safety and independence while minimizing falls risk and caregiver burden. Upon hospital discharge, recommend STR to maximize pt safety and return to PLOF.       Recommendations for follow up therapy are one component of a multi-disciplinary discharge planning process, led by the attending physician.  Recommendations may be updated based on patient status, additional functional criteria and insurance authorization.   Follow Up Recommendations  Skilled nursing-short term rehab (<3 hours/day)    Assistance Recommended at Discharge Frequent or constant Supervision/Assistance  Patient can return home with the  following A lot of help with walking and/or transfers;A lot of help with bathing/dressing/bathroom;Assistance with cooking/housework;Help with stairs or ramp for entrance    Functional Status Assessment  Patient has had a recent decline in their functional status and demonstrates the ability to make significant improvements in function in a reasonable and predictable amount of time.  Equipment Recommendations  BSC/3in1;Other (comment) (2WW)    Recommendations for Other Services       Precautions / Restrictions Precautions Precautions: Fall;Anterior Hip Restrictions Weight Bearing Restrictions: Yes RLE Weight Bearing: Weight bearing as tolerated      Mobility Bed Mobility Overal bed mobility: Needs Assistance Bed Mobility: Supine to Sit     Supine to sit: Max assist;HOB elevated          Transfers Overall transfer level: Needs assistance Equipment used: Rolling walker (2 wheels) Transfers: Sit to/from Stand;Bed to chair/wheelchair/BSC Sit to Stand: Mod assist;From elevated surface     Step pivot transfers: Mod assist;+2 safety/equipment            Balance Overall balance assessment: Needs assistance Sitting-balance support: No upper extremity supported;Feet supported Sitting balance-Leahy Scale: Fair     Standing balance support: Single extremity supported;During functional activity Standing balance-Leahy Scale: Poor                             ADL either performed or assessed with clinical judgement   ADL Overall ADL's : Needs assistance/impaired                                       General ADL Comments: MAX A don  B socks seated EOB. MOD A for adjust underwear in standing, assist for pullling over rear and balance with single UE support. MOD A + RW for ADL t/f, +2 for lines mgmt      Pertinent Vitals/Pain Pain Assessment: 0-10 Pain Score: 10-Worst pain ever Pain Location: RLE Pain Descriptors / Indicators:  Discomfort;Dull;Grimacing;Operative site guarding Pain Intervention(s): Limited activity within patient's tolerance;Repositioned;Patient requesting pain meds-RN notified     Hand Dominance Right   Extremity/Trunk Assessment Upper Extremity Assessment Upper Extremity Assessment: Generalized weakness   Lower Extremity Assessment Lower Extremity Assessment: Generalized weakness       Communication Communication Communication: No difficulties   Cognition Arousal/Alertness: Awake/alert Behavior During Therapy: WFL for tasks assessed/performed;Anxious Overall Cognitive Status: Within Functional Limits for tasks assessed                                       General Comments  BP 99/52, MAP 68, HR 98    Exercises     Shoulder Instructions      Home Living Family/patient expects to be discharged to:: Private residence Living Arrangements: Alone Available Help at Discharge: Family;Available PRN/intermittently;Neighbor Type of Home: Apartment Home Access: Level entry     Home Layout: One level     Bathroom Shower/Tub: Tub/shower unit         Home Equipment: Grab bars - tub/shower          Prior Functioning/Environment Prior Level of Function : Independent/Modified Independent;Driving                        OT Problem List: Decreased range of motion;Decreased strength;Decreased activity tolerance;Impaired balance (sitting and/or standing);Decreased knowledge of precautions;Decreased knowledge of use of DME or AE      OT Treatment/Interventions: Therapeutic exercise;Self-care/ADL training;Energy conservation;DME and/or AE instruction;Therapeutic activities;Patient/family education;Balance training    OT Goals(Current goals can be found in the care plan section) Acute Rehab OT Goals Patient Stated Goal: To go home OT Goal Formulation: With patient/family Time For Goal Achievement: 12/14/21 Potential to Achieve Goals: Good ADL Goals Pt  Will Perform Grooming: standing;with min assist (c LRAD PRN) Pt Will Perform Lower Body Dressing: with min assist;with caregiver independent in assisting;with adaptive equipment;sit to/from stand (c LRAD PRN) Pt Will Transfer to Toilet: with min assist;ambulating;bedside commode (c LRAD PRN)  OT Frequency: Min 3X/week    Co-evaluation              AM-PAC OT "6 Clicks" Daily Activity     Outcome Measure Help from another person eating meals?: None Help from another person taking care of personal grooming?: A Little Help from another person toileting, which includes using toliet, bedpan, or urinal?: A Lot Help from another person bathing (including washing, rinsing, drying)?: A Lot Help from another person to put on and taking off regular upper body clothing?: A Little Help from another person to put on and taking off regular lower body clothing?: A Lot 6 Click Score: 16   End of Session Equipment Utilized During Treatment: Gait belt;Rolling walker (2 wheels)  Activity Tolerance: Patient tolerated treatment well Patient left: in chair;with call bell/phone within reach;with chair alarm set;with family/visitor present  OT Visit Diagnosis: Unsteadiness on feet (R26.81);Other abnormalities of gait and mobility (R26.89)                Time: 2229-7989 OT Time Calculation (min): 40 min  Charges:  OT General Charges $OT Visit: 1 Visit OT Evaluation $OT Eval Low Complexity: 1 Low OT Treatments $Self Care/Home Management : 23-37 mins  Kathie Dikeevin Nedim Oki, M.S. OTR/L  11/30/21, 11:39 AM  ascom [phone]

## 2021-11-30 NOTE — Plan of Care (Signed)

## 2021-11-30 NOTE — Progress Notes (Signed)
PROGRESS NOTE    Gabrielle Salinas  [id number] DOB: [birth year] DOA: 11/28/2021 PCP: Birdie Sons, MD    Brief Narrative:   multiple medical issues as well as right hip fracture after accidental mechanical fall at home.  She tripped over her shoes and fell on her right side.  Patient also found to have COVID-19 positivity.  She does have some cough and congestion but no hypoxemia.   Assessment & Plan:   Principal Problem:   Closed right hip fracture ([place]) Active Problems:   COVID-19 virus infection  Close traumatic right hip fracture: Status post right total hip 1/3.  Wound VAC and postop management as per surgery. Lovenox for DVT prophylaxis. Weightbearing as tolerated.  Start working with PT OT. Adequate pain medications, oral and IV opiates and muscle relaxants.  COVID-19 virus infection: Mildly symptomatic.  Today with no pulmonary symptoms.  Discontinue remdesivir with no proven benefit.  Cough medications and symptomatic treatment.  Anxiety and depression: On Xanax and Prozac that she will continue.  Essential hypertension: With orthostatic hypotension today on mobility.  Discontinue lisinopril and hydrochlorothiazide.  Hyponatremia: Due to dehydration.  Treated with isotonic fluid with improvement.  Recheck levels tomorrow morning.  Hypokalemia: Replace.  Recheck levels.  Also check magnesium levels.  Discontinue hydrochlorothiazide today.   DVT prophylaxis: enoxaparin (LOVENOX) injection 40 mg Start: 11/30/21 1000 SCDs Start: 11/29/21 1844 Place TED hose Start: 11/29/21 1844 SCDs Start: 11/29/21 0056   Code Status: Full code Family Communication: Son at the bedside Disposition Plan: Status is: Inpatient  Remains inpatient appropriate because: Immediate postop.         Consultants:  Orthopedics  Procedures:  Right total hip  Antimicrobials:  Remdesivir 1/3-1/4.  Discontinued   Subjective: Patient seen and examined.  Mild to moderate pain  right hip.  Son at the bedside.  Patient is not out of bed yet.  Denies any chest pain or shortness of breath.  Afebrile.  On room air.  Looking forward to go home with help from the children.  Objective: Vitals:   11/29/21 2028 11/29/21 2224 11/30/21 0600 11/30/21 0858  BP: (!) 92/52 (!) 129/56 125/62 (!) 141/58  Pulse: 84 97 (!) 108 (!) 109  Resp: 16 16 18 18   Temp: (!) 97 F (36.1 C) 99.7 F (37.6 C) 99.6 F (37.6 C) 99.4 F (37.4 C)  TempSrc:      SpO2: 96% 96% 93% 93%  Weight:      Height:        Intake/Output Summary (Last 24 hours) at 11/30/2021 1215 Last data filed at 11/30/2021 0659 Gross per 24 hour  Intake 1080.05 ml  Output 1790 ml  Net -709.95 ml   Filed Weights   11/28/21 2223  Weight: 74.4 kg    Examination:  General exam: Appears calm and comfortable  Slightly anxious. Respiratory system: Clear to auscultation. Respiratory effort normal.  On room air. Cardiovascular system: S1 & S2 heard, RRR.  Gastrointestinal system: Soft.  Nontender.  Bowel sound present.   Central nervous system: Alert and oriented. No focal neurological deficits. Extremities: Symmetric 5 x 5 power. Right hip anterior incision clean and dry.  Wound VAC with empty canister.  Minimal swelling as anticipated postop.    Data Reviewed: I have personally reviewed following labs and imaging studies  CBC: Recent Labs  Lab 11/29/21 0032 11/30/21 0424  WBC 9.6 8.5  NEUTROABS  --  5.9  HGB 14.1 10.6*  HCT 41.2 31.7*  MCV 95.2 94.9  PLT 204 [id number]   Basic Metabolic Panel: Recent Labs  Lab 11/29/21 0032 11/30/21 0424  NA 129* 130*  K 3.5 3.0*  CL 96* 93*  CO2 24 26  GLUCOSE 139* 167*  BUN 13 16  CREATININE 0.72 0.75  CALCIUM 8.3* 7.8*   GFR: Estimated Creatinine Clearance: 61.4 mL/min (by C-G formula based on SCr of 0.75 mg/dL). Liver Function Tests: Recent Labs  Lab 11/30/21 0424  AST 31  ALT 28  ALKPHOS 92  BILITOT 0.5  PROT 5.8*  ALBUMIN 2.8*   No results for  input(s): LIPASE, AMYLASE in the last 168 hours. No results for input(s): AMMONIA in the last 168 hours. Coagulation Profile: Recent Labs  Lab 11/29/21 0032  INR 1.1   Cardiac Enzymes: No results for input(s): CKTOTAL, CKMB, CKMBINDEX, TROPONINI in the last 168 hours. BNP (last 3 results) No results for input(s): PROBNP in the last 8760 hours. HbA1C: No results for input(s): HGBA1C in the last 72 hours. CBG: No results for input(s): GLUCAP in the last 168 hours. Lipid Profile: No results for input(s): CHOL, HDL, LDLCALC, TRIG, CHOLHDL, LDLDIRECT in the last 72 hours. Thyroid Function Tests: No results for input(s): TSH, T4TOTAL, FREET4, T3FREE, THYROIDAB in the last 72 hours. Anemia Panel: Recent Labs    11/29/21 0717 11/30/21 0424  FERRITIN 150 106   Sepsis Labs: Recent Labs  Lab 11/29/21 0717  PROCALCITON <0.10    Recent Results (from the past 240 hour(s))  Resp Panel by RT-PCR (Flu A&B, Covid) Nasopharyngeal Swab     Status: Abnormal   Collection Time: 11/29/21 12:32 AM   Specimen: Nasopharyngeal Swab; Nasopharyngeal(NP) swabs in vial transport medium  Result Value Ref Range Status   SARS Coronavirus 2 by RT PCR POSITIVE (A) NEGATIVE Final    Comment: (NOTE) SARS-CoV-2 target nucleic acids are DETECTED.  The SARS-CoV-2 RNA is generally detectable in upper respiratory specimens during the acute phase of infection. Positive results are indicative of the presence of the identified virus, but do not rule out bacterial infection or co-infection with other pathogens not detected by the test. Clinical correlation with patient history and other diagnostic information is necessary to determine patient infection status. The expected result is Negative.  Fact Sheet for Patients: EntrepreneurPulse.com.au  Fact Sheet for Healthcare Providers: IncredibleEmployment.be  This test is not yet approved or cleared by the [place] FDA and   has been authorized for detection and/or diagnosis of SARS-CoV-2 by FDA under an Emergency Use Authorization (EUA).  This EUA will remain in effect (meaning this test can be used) for the duration of  the COVID-19 declaration under Section 564(b)(1) of the A ct, 21 U.S.C. section 360bbb-3(b)(1), unless the authorization is terminated or revoked sooner.     Influenza A by PCR NEGATIVE NEGATIVE Final   Influenza B by PCR NEGATIVE NEGATIVE Final    Comment: (NOTE) The Xpert Xpress SARS-CoV-2/FLU/RSV plus assay is intended as an aid in the diagnosis of influenza from Nasopharyngeal swab specimens and should not be used as a sole basis for treatment. Nasal washings and aspirates are unacceptable for Xpert Xpress SARS-CoV-2/FLU/RSV testing.  Fact Sheet for Patients: EntrepreneurPulse.com.au  Fact Sheet for Healthcare Providers: IncredibleEmployment.be  This test is not yet approved or cleared by the [place] FDA and has been authorized for detection and/or diagnosis of SARS-CoV-2 by FDA under an Emergency Use Authorization (EUA). This EUA will remain in effect (meaning this test can be used) for the duration of the COVID-19 declaration under  Section 564(b)(1) of the Act, 21 U.S.C. section 360bbb-3(b)(1), unless the authorization is terminated or revoked.  Performed at [facility], [address], [place], [place] [address]   Surgical pcr screen     Status: None   Collection Time: 11/29/21  9:07 AM   Specimen: Nasal Mucosa; Nasal Swab  Result Value Ref Range Status   MRSA, PCR NEGATIVE NEGATIVE Final   Staphylococcus aureus NEGATIVE NEGATIVE Final    Comment: (NOTE) The Xpert SA Assay (FDA approved for NASAL specimens in patients 15 years of age and older), is one component of a comprehensive surveillance program. It is not intended to diagnose infection nor to guide or monitor treatment. Performed at [facility],  [address], [place], [place] [address]          Radiology Studies: DG Chest 1 View  Result Date: 11/28/2021 CLINICAL DATA:  Status post fall EXAM: CHEST  1 VIEW COMPARISON:  None. FINDINGS: The heart and mediastinal contours are unchanged. No focal consolidation. No pulmonary edema. No pleural effusion. No pneumothorax. No acute osseous abnormality. Dextroscoliosis of the mid to lower thoracic spine. IMPRESSION: No active disease. Electronically Signed   By: Iven Finn M.D.   On: 11/28/2021 23:30   DG C-Arm 1-60 Min-No Report  Result Date: 11/29/2021 Fluoroscopy was utilized by the requesting physician.  No radiographic interpretation.   DG C-Arm 1-60 Min-No Report  Result Date: 11/29/2021 Fluoroscopy was utilized by the requesting physician.  No radiographic interpretation.   DG HIP UNILAT WITH PELVIS 1V RIGHT  Result Date: 11/29/2021 CLINICAL DATA:  Right hip fracture. EXAM: DG HIP (WITH OR WITHOUT PELVIS) 1V RIGHT COMPARISON:  11/28/2020 FINDINGS: Right hip replacement in satisfactory position and alignment. No fracture or complication IMPRESSION: Right hip replacement for right femoral neck fracture. Electronically Signed   By: Franchot Gallo M.D.   On: 11/29/2021 17:16   DG HIP UNILAT W OR W/O PELVIS 2-3 VIEWS RIGHT  Result Date: 11/29/2021 CLINICAL DATA:  Postoperative pain. EXAM: DG HIP (WITH OR WITHOUT PELVIS) 2-3V RIGHT COMPARISON:  Fluoroscopic images of same day. FINDINGS: Status post right hip arthroplasty. Expected postoperative changes are seen in the surrounding soft tissues. No fracture or dislocation is noted. IMPRESSION: Status post right hip arthroplasty. Electronically Signed   By: Marijo Conception M.D.   On: 11/29/2021 19:21   DG Hip Unilat  With Pelvis 2-3 Views Right  Result Date: 11/28/2021 CLINICAL DATA:  Right hip pain. EXAM: DG HIP (WITH OR WITHOUT PELVIS) 2-3V RIGHT COMPARISON:  None. FINDINGS: Marked severity foreshortening of the right femoral neck is  seen. There is no evidence of dislocation. Sclerotic changes are seen involving the symphysis pubis. IMPRESSION: Acute fracture of the right femoral neck. Electronically Signed   By: Virgina Norfolk M.D.   On: 11/28/2021 23:32        Scheduled Meds:  vitamin C  500 mg Oral Daily   calcium-vitamin D  1 tablet Oral Q breakfast   Chlorhexidine Gluconate Cloth  6 each Topical Daily   cholecalciferol  1,000 Units Oral Daily   docusate sodium  100 mg Oral BID   enoxaparin (LOVENOX) injection  40 mg Subcutaneous Q24H   ezetimibe  10 mg Oral Daily   feeding supplement  237 mL Oral BID BM   FLUoxetine  20 mg Oral QHS   magnesium hydroxide  30 mL Oral QHS   multivitamin with minerals  1 tablet Oral Daily   multivitamin-lutein  1 capsule  Oral Daily   pantoprazole  40 mg Oral Daily   potassium chloride  40 mEq Oral BID   zinc sulfate  220 mg Oral Daily   Continuous Infusions:  methocarbamol (ROBAXIN) IV       LOS: 1 day    Time spent: 30 minutes    Barb Merino, MD [facility] Pager [phone]

## 2021-11-30 NOTE — Progress Notes (Signed)
PT Cancellation Note  Patient Details Name: Gabrielle Salinas MRN: [id number] DOB: January 24, 1951   Cancelled Treatment:     PT attempt. Pt extremely lethargic. Falling asleep mid conversation. Author will return shortly for 2nd/PM session    Rushie Chestnut 11/30/2021, 3:32 PM

## 2021-11-30 NOTE — Evaluation (Signed)
Physical Therapy Evaluation Patient Details Name: Gabrielle PittsSarah Rhew Puff MRN: [id number] DOB: 10-22-1951 Today's Date: 11/30/2021  History of Present Illness  Gabrielle Salinas is a 71 y.o. Caucasian female with medical history significant for anxiety, depression, GERD, dyslipidemia, hypertension, prediabetes, macular degeneration and osteoporosis, who presented to the ER with acute onset of left hip pain after having an accidental mechanical fall. S/p R THA on 11/29/21, anterior THPs  Clinical Impression  Seated in recliner, son at bedside upon arrival to room; endorses generalized fatigue due to recent events.  R hip pain 6/10 (FACES scale); meds received per RN at initiation of session.  Demonstrates fair strength (3-/5) to R hip, guarded and generally limited by pain; does seem to tolerate increase in movement with repetition.  Currently requiring mod assist for sit/stand; min/mod assist for static stance and forward/backward step with RW.  Heavy WBing bilat UEs, but fair/good R hip/knee control in loading.  Minimal change in pain reported with WBing. Did note onset of dizziness/lightheadedness with prolonged standing, requiring return to seated position for recovery.  Seated BP noted at 96/56 (down from 143/54 in semi-supine beginning of session); recovers to BP 125/60 by end of session.  RN/MD informed/aware of orthostasis; additional mobility deferred as result.   Would benefit from skilled PT to address above deficits and promote optimal return to PLOF.; recommend transition to STR upon discharge from acute hospitalization.      Recommendations for follow up therapy are one component of a multi-disciplinary discharge planning process, led by the attending physician.  Recommendations may be updated based on patient status, additional functional criteria and insurance authorization.  Follow Up Recommendations Skilled nursing-short term rehab (<3 hours/day)    Assistance Recommended at Discharge PRN   Patient can return home with the following  A lot of help with walking and/or transfers;Assistance with cooking/housework;Help with stairs or ramp for entrance    Equipment Recommendations Rolling walker (2 wheels);BSC/3in1  Recommendations for Other Services       Functional Status Assessment Patient has had a recent decline in their functional status and demonstrates the ability to make significant improvements in function in a reasonable and predictable amount of time.     Precautions / Restrictions Precautions Precautions: Fall;Anterior Hip Restrictions Weight Bearing Restrictions: Yes RLE Weight Bearing: Weight bearing as tolerated      Mobility  Bed Mobility Overal bed mobility: Needs Assistance Bed Mobility: Supine to Sit     Supine to sit: Max assist;HOB elevated     General bed mobility comments: seated in recliner beginning/end of treatment session    Transfers Overall transfer level: Needs assistance Equipment used: Rolling walker (2 wheels) Transfers: Sit to/from Stand Sit to Stand: Mod assist   Step pivot transfers: Mod assist;+2 safety/equipment       General transfer comment: constant cuing for hand placement; extensive lift assist required.  Maintains static standing with min assist once upright.    Ambulation/Gait               General Gait Details: unable to tolerate due to symptomatic orthostasis  Stairs            Wheelchair Mobility    Modified Rankin (Stroke Patients Only)       Balance Overall balance assessment: Needs assistance Sitting-balance support: No upper extremity supported;Feet supported Sitting balance-Leahy Scale: Fair     Standing balance support: Bilateral upper extremity supported Standing balance-Leahy Scale: Poor  Pertinent Vitals/Pain Pain Assessment: Faces Pain Score: 10-Worst pain ever Faces Pain Scale: Hurts even more Pain Location: R hip Pain  Descriptors / Indicators: Aching;Grimacing;Guarding Pain Intervention(s): Limited activity within patient's tolerance;Monitored during session;Repositioned;RN gave pain meds during session    Home Living Family/patient expects to be discharged to:: Private residence Living Arrangements: Alone Available Help at Discharge: Family;Available PRN/intermittently;Neighbor Type of Home: Apartment Home Access: Level entry       Home Layout: One level Home Equipment: Grab bars - tub/shower      Prior Function Prior Level of Function : Independent/Modified Independent;Driving             Mobility Comments: Indep with ADLs, household and community mobilization without assist device; denies additional fall history; + driving.       Hand Dominance   Dominant Hand: Right    Extremity/Trunk Assessment   Upper Extremity Assessment Upper Extremity Assessment: Overall WFL for tasks assessed    Lower Extremity Assessment Lower Extremity Assessment: Generalized weakness (R hip grossly 3-/5, limited by pain; otherwise, LEs grossly at least 4-/5 throughout)       Communication   Communication: No difficulties  Cognition Arousal/Alertness: Awake/alert Behavior During Therapy: WFL for tasks assessed/performed Overall Cognitive Status: Within Functional Limits for tasks assessed                                          General Comments General comments (skin integrity, edema, etc.): BP 99/52, MAP 68, HR 98    Exercises Other Exercises Other Exercises: Educated in role of PT and progressive mobility, WBing precautions (WBAT) and functional implications, importance of BP/orthostatic hypotension management; patient voiced understanding.  Will continue to reinforce. Other Exercises: Seated LE therex, 1x10, active ROM: ankle pumps, quad sets, hip abduct/adduct and heel slides (LEs elevated in recliner); LAQs (short-sitting in chair)   Assessment/Plan    PT Assessment  Patient needs continued PT services  PT Problem List Decreased strength;Decreased range of motion;Decreased activity tolerance;Decreased balance;Decreased mobility;Decreased coordination;Decreased cognition;Decreased knowledge of use of DME;Decreased safety awareness;Decreased knowledge of precautions;Cardiopulmonary status limiting activity;Pain       PT Treatment Interventions DME instruction;Stair training;Gait training;Functional mobility training;Therapeutic activities;Balance training;Therapeutic exercise;Patient/family education    PT Goals (Current goals can be found in the Care Plan section)  Acute Rehab PT Goals Patient Stated Goal: to get some rest PT Goal Formulation: With patient/family Time For Goal Achievement: 12/14/21 Potential to Achieve Goals: Good    Frequency BID     Co-evaluation               AM-PAC PT "6 Clicks" Mobility  Outcome Measure Help needed turning from your back to your side while in a flat bed without using bedrails?: None Help needed moving from lying on your back to sitting on the side of a flat bed without using bedrails?: A Lot Help needed moving to and from a bed to a chair (including a wheelchair)?: A Lot Help needed standing up from a chair using your arms (e.g., wheelchair or bedside chair)?: A Lot Help needed to walk in hospital room?: A Lot Help needed climbing 3-5 steps with a railing? : A Lot 6 Click Score: 14    End of Session Equipment Utilized During Treatment: Gait belt Activity Tolerance: Patient tolerated treatment well;Treatment limited secondary to medical complications (Comment) (symptomatic orthostasis) Patient left: in chair;with call bell/phone within reach;with chair  alarm set Nurse Communication: Mobility status PT Visit Diagnosis: Muscle weakness (generalized) (M62.81);Difficulty in walking, not elsewhere classified (R26.2);Pain Pain - Right/Left: Right Pain - part of body: Hip    Time: 3875-6433 PT Time  Calculation (min) (ACUTE ONLY): 31 min   Charges:   PT Evaluation $PT Eval Moderate Complexity: 1 Mod PT Treatments $Therapeutic Exercise: 8-22 mins      Patria Warzecha H. Manson Passey, PT, DPT, NCS 11/30/21, 1:52 PM [phone]

## 2021-11-30 NOTE — Progress Notes (Signed)
° °  Subjective: 1 Day Post-Op Procedure(s) (LRB): TOTAL HIP ARTHROPLASTY ANTERIOR APPROACH (Right) Patient reports pain as 8 on 0-10 scale.   Patient is well, and has had no acute complaints or problems Denies any CP, SOB, ABD pain. We will continue therapy today.    Objective: Vital signs in last 24 hours: Temp:  [97 F (36.1 C)-99.7 F (37.6 C)] 99.6 F (37.6 C) (01/04 0600) Pulse Rate:  [83-108] 108 (01/04 0600) Resp:  [16-18] 18 (01/04 0600) BP: (92-173)/(52-75) 125/62 (01/04 0600) SpO2:  [93 %-100 %] 93 % (01/04 0600)  Intake/Output from previous day: 01/03 0701 - 01/04 0700 In: 1080.1 [I.V.:970.1; IV Piggyback:110] Out: 1790 [Urine:1740; Blood:50] Intake/Output this shift: No intake/output data recorded.  Recent Labs    11/29/21 0032 11/30/21 0424  HGB 14.1 10.6*   Recent Labs    11/29/21 0032 11/30/21 0424  WBC 9.6 8.5  RBC 4.33 3.34*  HCT 41.2 31.7*  PLT 204 170   Recent Labs    11/29/21 0032 11/30/21 0424  NA 129* 130*  K 3.5 3.0*  CL 96* 93*  CO2 24 26  BUN 13 16  CREATININE 0.72 0.75  GLUCOSE 139* 167*  CALCIUM 8.3* 7.8*   Recent Labs    11/29/21 0032  INR 1.1    EXAM General - Patient is Alert, Appropriate, and Oriented Extremity - Neurovascular intact Sensation intact distally Intact pulses distally Dorsiflexion/Plantar flexion intact No cellulitis present Compartment soft Dressing - dressing C/D/I and no drainage, provena intact Motor Function - intact, moving foot and toes well on exam.   Past Medical History:  Diagnosis Date   Allergy    Anxiety    Cataract    Depression    GERD (gastroesophageal reflux disease)    Hyperlipidemia    Hypertension    Osteoporosis     Assessment/Plan:   1 Day Post-Op Procedure(s) (LRB): TOTAL HIP ARTHROPLASTY ANTERIOR APPROACH (Right) Principal Problem:   Closed right hip fracture (HCC) Active Problems:   COVID-19 virus infection  Estimated body mass index is 29.05 kg/m as  calculated from the following:   Height as of this encounter: 5\' 3"  (1.6 m).   Weight as of this encounter: 74.4 kg. Advance diet Up with therapy Work on BM VSS, encourage incentive spirometer Labs stable with mild hypokalemia - defer to IM team CM to assist with discharge   DVT Prophylaxis - Lovenox, TED hose, and SCDS Weight-Bearing as tolerated to Right leg   T. , PA-C [facility] 11/30/2021, 8:12 AM

## 2021-12-01 ENCOUNTER — Inpatient Hospital Stay: Payer: Medicare Other

## 2021-12-01 LAB — CBC WITH DIFFERENTIAL/PLATELET
Abs Immature Granulocytes: 0.06 10*3/uL (ref 0.00–0.07)
Basophils Absolute: 0 10*3/uL (ref 0.0–0.1)
Basophils Relative: 0 %
Eosinophils Absolute: 0 10*3/uL (ref 0.0–0.5)
Eosinophils Relative: 0 %
HCT: 29 % — ABNORMAL LOW (ref 36.0–46.0)
Hemoglobin: 10.1 g/dL — ABNORMAL LOW (ref 12.0–15.0)
Immature Granulocytes: 1 %
Lymphocytes Relative: 22 %
Lymphs Abs: 2.5 10*3/uL (ref 0.7–4.0)
MCH: 33 pg (ref 26.0–34.0)
MCHC: 34.8 g/dL (ref 30.0–36.0)
MCV: 94.8 fL (ref 80.0–100.0)
Monocytes Absolute: 1.1 10*3/uL — ABNORMAL HIGH (ref 0.1–1.0)
Monocytes Relative: 10 %
Neutro Abs: 7.7 10*3/uL (ref 1.7–7.7)
Neutrophils Relative %: 67 %
Platelets: 152 10*3/uL (ref 150–400)
RBC: 3.06 MIL/uL — ABNORMAL LOW (ref 3.87–5.11)
RDW: 13.3 % (ref 11.5–15.5)
WBC: 11.4 10*3/uL — ABNORMAL HIGH (ref 4.0–10.5)
nRBC: 0 % (ref 0.0–0.2)

## 2021-12-01 LAB — BASIC METABOLIC PANEL
Anion gap: 8 (ref 5–15)
BUN: 16 mg/dL (ref 8–23)
CO2: 29 mmol/L (ref 22–32)
Calcium: 8 mg/dL — ABNORMAL LOW (ref 8.9–10.3)
Chloride: 93 mmol/L — ABNORMAL LOW (ref 98–111)
Creatinine, Ser: 0.6 mg/dL (ref 0.44–1.00)
GFR, Estimated: >60 mL/min (ref >60)
Glucose, Bld: 165 mg/dL — ABNORMAL HIGH (ref 70–99)
Potassium: 3.9 mmol/L (ref 3.5–5.1)
Sodium: 130 mmol/L — ABNORMAL LOW (ref 135–145)

## 2021-12-01 LAB — MAGNESIUM: Magnesium: 2.1 mg/dL (ref 1.7–2.4)

## 2021-12-01 LAB — SURGICAL PATHOLOGY

## 2021-12-01 MED ORDER — ENOXAPARIN SODIUM 40 MG/0.4ML IJ SOSY: 40.0000 mg | PREFILLED_SYRINGE | INTRAMUSCULAR | 0 refills | Status: DC

## 2021-12-01 MED ORDER — HYDROCODONE-ACETAMINOPHEN 5-325 MG PO TABS
1.0000 | ORAL_TABLET | Freq: Four times a day (QID) | ORAL | 0 refills | Status: DC | PRN
Start: 2021-12-01 — End: 2021-12-03

## 2021-12-01 NOTE — Progress Notes (Signed)
Physical Therapy Treatment Patient Details Name: Gabrielle Salinas MRN: [id number] DOB: May 14, 1951 Today's Date: 12/01/2021   History of Present Illness Gabrielle Salinas is a 71 y.o. Caucasian female with medical history significant for anxiety, depression, GERD, dyslipidemia, hypertension, prediabetes, macular degeneration and osteoporosis, who presented to the ER with acute onset of left hip pain after having an accidental mechanical fall. S/p R THA on 11/29/21, anterior THPs    PT Comments    Pt was sitting in recliner upon arriving. She is much more alert today and demonstrated greatly improved abilities. She stood and ambulated 100 ft in room with RW. No LOB or safety concerns during ambulation. Lengthy discussion about DC disposition. Pt is progressing quickly. Did discuss with pt's son Tresa Endo) who will be present for PM session. DC disposition changed to Home with HH. Will continue to follow and progress pt as able per current POC.    Recommendations for follow up therapy are one component of a multi-disciplinary discharge planning process, led by the attending physician.  Recommendations may be updated based on patient status, additional functional criteria and insurance authorization.  Follow Up Recommendations  Home health PT     Assistance Recommended at Discharge PRN  Patient can return home with the following A little help with bathing/dressing/bathroom;Assist for transportation;Help with stairs or ramp for entrance;A little help with walking and/or transfers   Equipment Recommendations  Rolling walker (2 wheels);BSC/3in1       Precautions / Restrictions Precautions Precautions: Fall;Anterior Hip Precaution Booklet Issued: No Restrictions Weight Bearing Restrictions: Yes RLE Weight Bearing: Weight bearing as tolerated     Mobility  Bed Mobility      General bed mobility comments: in recliner pre/post session." my son helped me OOB this morning.    Transfers Overall  transfer level: Needs assistance Equipment used: Rolling walker (2 wheels) Transfers: Sit to/from Stand Sit to Stand: Min guard      General transfer comment: CGA for safety. pt was easily able to stand and sit with good control    Ambulation/Gait Ambulation/Gait assistance: Min guard;Supervision Gait Distance (Feet): 100 Feet Assistive device: Rolling walker (2 wheels) Gait Pattern/deviations: Step-through pattern;Antalgic Gait velocity: decreased     General Gait Details: Pt was able to ambulate 100 ft in room wihtout LOB or safety concern. Greaetly improved form previous date.      Balance Overall balance assessment: Needs assistance Sitting-balance support: No upper extremity supported;Feet supported Sitting balance-Leahy Scale: Good     Standing balance support: Bilateral upper extremity supported;During functional activity;Reliant on assistive device for balance Standing balance-Leahy Scale: Good       Cognition Arousal/Alertness: Awake/alert Behavior During Therapy: WFL for tasks assessed/performed Overall Cognitive Status: Within Functional Limits for tasks assessed      General Comments: Pt is A and O x 4 and much more alert today               Pertinent Vitals/Pain Pain Assessment: 0-10 Pain Score: 3  Faces Pain Scale: Hurts a little bit Pain Location: R hip Pain Descriptors / Indicators: Aching;Grimacing;Guarding Pain Intervention(s): Limited activity within patient's tolerance;Monitored during session;Premedicated before session;Repositioned;Ice applied     PT Goals (current goals can now be found in the care plan section) Acute Rehab PT Goals Patient Stated Goal: get better so I can go home. Progress towards PT goals: Progressing toward goals    Frequency    BID      PT Plan Discharge plan needs to be updated  AM-PAC PT "6 Clicks" Mobility   Outcome Measure  Help needed turning from your back to your side while in a flat bed  without using bedrails?: None Help needed moving from lying on your back to sitting on the side of a flat bed without using bedrails?: A Little Help needed moving to and from a bed to a chair (including a wheelchair)?: A Little Help needed standing up from a chair using your arms (e.g., wheelchair or bedside chair)?: A Little Help needed to walk in hospital room?: A Little Help needed climbing 3-5 steps with a railing? : A Little 6 Click Score: 19    End of Session   Activity Tolerance: Patient tolerated treatment well Patient left: in chair;with call bell/phone within reach;with chair alarm set Nurse Communication: Mobility status PT Visit Diagnosis: Muscle weakness (generalized) (M62.81);Difficulty in walking, not elsewhere classified (R26.2);Pain Pain - Right/Left: Right Pain - part of body: Hip     Time: 0277-4128 PT Time Calculation (min) (ACUTE ONLY): 33 min  Charges:  $Gait Training: 8-22 mins $Therapeutic Activity: 8-22 mins                    Jetta Lout PTA 12/01/21, 2:34 PM

## 2021-12-01 NOTE — TOC Progression Note (Addendum)
Transition of Care [facility]) - Progression Note    Patient Details  Name: Gabrielle Salinas MRN: [id number] Date of Birth: Sep 01, 1951  [facility] CM/SW Contact  Marlowe Sax, RN Phone Number: 12/01/2021, 12:11 PM  Clinical Narrative:    The patient had a covid test in Nov at [facility] that was positive, her current Covid is CT value of 18.5 showing infectious, can't go to rehab for 10 days 1/13   The patient is upgraded to recommendation is Home with home health She will need a rw and a 3 in 1 [employer] will deliver the equipment to the room, I requested [facility] services from [facility], awaiting a call back        Expected Discharge Plan and Services                                                 Social Determinants of Health (SDOH) Interventions    Readmission Risk Interventions No flowsheet data found.

## 2021-12-01 NOTE — Progress Notes (Signed)
° °  Subjective: 2 Days Post-Op Procedure(s) (LRB): TOTAL HIP ARTHROPLASTY ANTERIOR APPROACH (Right) Patient reports pain as moderate.   Patient is well, and has had no acute complaints or problems Denies any CP, SOB, ABD pain. We will continue therapy today.  Plan is to go to rehab facility   Objective: Vital signs in last 24 hours: Temp:  [97.3 F (36.3 C)-98.8 F (37.1 C)] 98.6 F (37 C) (01/05 1131) Pulse Rate:  [92-103] 100 (01/05 1131) Resp:  [16-18] 16 (01/05 1131) BP: (115-141)/(55-75) 115/56 (01/05 1131) SpO2:  [90 %-98 %] 90 % (01/05 1131)  Intake/Output from previous day: 01/04 0701 - 01/05 0700 In: -  Out: 350 [Urine:350] Intake/Output this shift: Total I/O In: -  Out: 1 [Urine:1]  Recent Labs    11/29/21 0032 11/30/21 0424 12/01/21 0429  HGB 14.1 10.6* 10.1*   Recent Labs    11/30/21 0424 12/01/21 0429  WBC 8.5 11.4*  RBC 3.34* 3.06*  HCT 31.7* 29.0*  PLT 170 152   Recent Labs    11/30/21 0424 12/01/21 0429  NA 130* 130*  K 3.0* 3.9  CL 93* 93*  CO2 26 29  BUN 16 16  CREATININE 0.75 0.60  GLUCOSE 167* 165*  CALCIUM 7.8* 8.0*   Recent Labs    11/29/21 0032  INR 1.1    EXAM General - Patient is Alert, Appropriate, and Oriented Extremity - Neurovascular intact Sensation intact distally Intact pulses distally Dorsiflexion/Plantar flexion intact No cellulitis present Compartment soft Dressing - dressing C/D/I and no drainage, provena intact Motor Function - intact, moving foot and toes well on exam.   Past Medical History:  Diagnosis Date   Allergy    Anxiety    Cataract    Depression    GERD (gastroesophageal reflux disease)    Hyperlipidemia    Hypertension    Osteoporosis     Assessment/Plan:   2 Days Post-Op Procedure(s) (LRB): TOTAL HIP ARTHROPLASTY ANTERIOR APPROACH (Right) Principal Problem:   Closed right hip fracture (HCC) Active Problems:   COVID-19 virus infection  Estimated body mass index is 29.05  kg/m as calculated from the following:   Height as of this encounter: 5\' 3"  (1.6 m).   Weight as of this encounter: 74.4 kg. Advance diet Up with therapy Work on BM VSS Labs stable CM to assist with discharge to skilled nursing facility   Remove Praveena and apply honeycomb dressing 7 days following discharge from hospital. Lovenox 40 mg subcu daily x14 days at discharge TED hose bilateral lower extremity x6 weeks Follow-up with [facility] in 2 weeks    DVT Prophylaxis - Lovenox, TED hose, and SCDS Weight-Bearing as tolerated to Right leg   T. [facility], PA-C [facility] 12/01/2021, 11:40 AM

## 2021-12-01 NOTE — TOC Progression Note (Signed)
Transition of Care [facility]) - Progression Note    Patient Details  Name: Gabrielle Salinas MRN: [id number] Date of Birth: June 23, 1951  [facility]) CM/SW Contact  Marlowe Sax, RN Phone Number: 12/01/2021, 4:30 PM  Clinical Narrative:   Spoke to the patient's son Tawanna Cooler on the phone, I explained to him that the patient has improved and that the recommendation has changed to home with home health Tawanna Cooler stated that the patient lives alone and they are concerned that she could fall at home as she has fallen many time, I explained that Home health only comes out a couple of times per week and are there approx 1 hour each time, I explained that if the recommendation is home health it is not likely that the patient would get insurance approval to go to SNF, I also explained that being covid positive that no SNF will accept her, I encouraged the son to look into hiring someone at home to stay with the patient if they are able to do that, he stated understanding and said he would talk to his brothers about it        Expected Discharge Plan and Services                                                 Social Determinants of Health (SDOH) Interventions    Readmission Risk Interventions No flowsheet data found.

## 2021-12-01 NOTE — Progress Notes (Signed)
PROGRESS NOTE    Gabrielle Salinas  XNT:[id number] DOB: November 01, 1951 DOA: 11/28/2021 PCP: Malva Limes, MD    Brief Narrative:   multiple medical issues as well as right hip fracture after accidental mechanical fall at home.  She tripped over her shoes and fell on her right side.  Patient also found to have COVID-19 positivity.  She does have some cough and congestion but no hypoxemia.   Assessment & Plan:   Principal Problem:   Closed right hip fracture (HCC) Active Problems:   COVID-19 virus infection  Close traumatic right hip fracture: Status post right total hip 1/3.  Wound VAC and postop management as per surgery. Lovenox for DVT prophylaxis. Weightbearing as tolerated.  Refer to SNF. Adequate pain medications, oral and IV opiates and muscle relaxants.  Using oral pain medications.  COVID-19 virus infection: Has some congestion.  No pneumonia or hypoxemia.  Symptomatic treatment.  Cough medications. Patient reported she was positive for COVID-19 in November and subsequently tested negative. CT value 18 from the COVID test on 1/3 indicating active infection. Isolation precautions for 10 days.  Anxiety and depression: On Xanax and Prozac that she will continue.  Essential hypertension: Antihypertensives on hold due to significant orthostatic blood pressures drop.  Currently improved.  Continue to hold antihypertensives.  Hyponatremia: Due to dehydration.  Treated with isotonic fluid with improvement.  Gradually stabilizing.  Hypokalemia: Replaced.  Improved.  DVT prophylaxis: enoxaparin (LOVENOX) injection 40 mg Start: 11/30/21 1000 SCDs Start: 11/29/21 1844 Place TED hose Start: 11/29/21 1844 SCDs Start: 11/29/21 0056   Code Status: Full code Family Communication: Son at the bedside 1/4 Disposition Plan: Status is: Inpatient  Remains inpatient appropriate because: SNF placement.         Consultants:  Orthopedics  Procedures:  Right total  hip  Antimicrobials:  Remdesivir 1/3-1/4.  Discontinued   Subjective:  Seen and examined.  Was able to get out of the bed to the chair without orthostatic symptoms today.  Moderate pain after mobility.  Anxious to bear weight.  Has some congestion but no fever or shortness of breath.  Agreeable to go to SNF.  Objective: Vitals:   11/30/21 2007 11/30/21 2353 12/01/21 0528 12/01/21 0844  BP: (!) 125/55 (!) 141/59 (!) 115/59 116/63  Pulse: 97 (!) 103 95 97  Resp: 18 16 16 16   Temp: 98.8 F (37.1 C) 98 F (36.7 C) 98.1 F (36.7 C) (!) 97.3 F (36.3 C)  TempSrc: Oral     SpO2: 92% 91% 97% 91%  Weight:      Height:        Intake/Output Summary (Last 24 hours) at 12/01/2021 1046 Last data filed at 12/01/2021 0908 Gross per 24 hour  Intake --  Output 351 ml  Net -351 ml   Filed Weights   11/28/21 2223  Weight: 74.4 kg    Examination:  General exam: Appears calm and comfortable  Sitting in chair.  On room air. Respiratory system: Some conducted upper airway sounds. Cardiovascular system: S1 & S2 heard, RRR.  Gastrointestinal system: Soft.  Nontender.  Bowel sound present.   Central nervous system: Alert and oriented. No focal neurological deficits. Extremities: Symmetric 5 x 5 power. Right hip anterior incision clean and dry.  Wound VAC with empty canister.      Data Reviewed: I have personally reviewed following labs and imaging studies  CBC: Recent Labs  Lab 11/29/21 0032 11/30/21 0424 12/01/21 0429  WBC 9.6 8.5 11.4*  NEUTROABS  --  5.9 7.7  HGB 14.1 10.6* 10.1*  HCT 41.2 31.7* 29.0*  MCV 95.2 94.9 94.8  PLT 204 170 152   Basic Metabolic Panel: Recent Labs  Lab 11/29/21 0032 11/30/21 0424 12/01/21 0429  NA 129* 130* 130*  K 3.5 3.0* 3.9  CL 96* 93* 93*  CO2 24 26 29   GLUCOSE 139* 167* 165*  BUN 13 16 16   CREATININE 0.72 0.75 0.60  CALCIUM 8.3* 7.8* 8.0*  MG  --   --  2.1   GFR: Estimated Creatinine Clearance: 61.4 mL/min (by C-G formula based  on SCr of 0.6 mg/dL). Liver Function Tests: Recent Labs  Lab 11/30/21 0424  AST 31  ALT 28  ALKPHOS 92  BILITOT 0.5  PROT 5.8*  ALBUMIN 2.8*   No results for input(s): LIPASE, AMYLASE in the last 168 hours. No results for input(s): AMMONIA in the last 168 hours. Coagulation Profile: Recent Labs  Lab 11/29/21 0032  INR 1.1   Cardiac Enzymes: No results for input(s): CKTOTAL, CKMB, CKMBINDEX, TROPONINI in the last 168 hours. BNP (last 3 results) No results for input(s): PROBNP in the last 8760 hours. HbA1C: No results for input(s): HGBA1C in the last 72 hours. CBG: No results for input(s): GLUCAP in the last 168 hours. Lipid Profile: No results for input(s): CHOL, HDL, LDLCALC, TRIG, CHOLHDL, LDLDIRECT in the last 72 hours. Thyroid Function Tests: No results for input(s): TSH, T4TOTAL, FREET4, T3FREE, THYROIDAB in the last 72 hours. Anemia Panel: Recent Labs    11/29/21 0717 11/30/21 0424  FERRITIN 150 106   Sepsis Labs: Recent Labs  Lab 11/29/21 0717  PROCALCITON <0.10    Recent Results (from the past 240 hour(s))  Resp Panel by RT-PCR (Flu A&B, Covid) Nasopharyngeal Swab     Status: Abnormal   Collection Time: 11/29/21 12:32 AM   Specimen: Nasopharyngeal Swab; Nasopharyngeal(NP) swabs in vial transport medium  Result Value Ref Range Status   SARS Coronavirus 2 by RT PCR POSITIVE (A) NEGATIVE Final    Comment: (NOTE) SARS-CoV-2 target nucleic acids are DETECTED.  The SARS-CoV-2 RNA is generally detectable in upper respiratory specimens during the acute phase of infection. Positive results are indicative of the presence of the identified virus, but do not rule out bacterial infection or co-infection with other pathogens not detected by the test. Clinical correlation with patient history and other diagnostic information is necessary to determine patient infection status. The expected result is Negative.  Fact Sheet for  Patients: 01/27/22  Fact Sheet for Healthcare Providers: 01/27/22  This test is not yet approved or cleared by the [employer] FDA and  has been authorized for detection and/or diagnosis of SARS-CoV-2 by FDA under an Emergency Use Authorization (EUA).  This EUA will remain in effect (meaning this test can be used) for the duration of  the COVID-19 declaration under Section 564(b)(1) of the A ct, 21 U.S.C. section 360bbb-3(b)(1), unless the authorization is terminated or revoked sooner.     Influenza A by PCR NEGATIVE NEGATIVE Final   Influenza B by PCR NEGATIVE NEGATIVE Final    Comment: (NOTE) The Xpert Xpress SARS-CoV-2/FLU/RSV plus assay is intended as an aid in the diagnosis of influenza from Nasopharyngeal swab specimens and should not be used as a sole basis for treatment. Nasal washings and aspirates are unacceptable for Xpert Xpress SARS-CoV-2/FLU/RSV testing.  Fact Sheet for Patients: Serious[employer]ker.it  Fact Sheet for Healthcare Providers: [place]  This test is not yet approved or cleared by the [employer] FDA  and has been authorized for detection and/or diagnosis of SARS-CoV-2 by FDA under an Emergency Use Authorization (EUA). This EUA will remain in effect (meaning this test can be used) for the duration of the COVID-19 declaration under Section 564(b)(1) of the Act, 21 U.S.C. section 360bbb-3(b)(1), unless the authorization is terminated or revoked.  Performed at [facility], [address] [place], [place] [address]   Surgical pcr screen     Status: None   Collection Time: 11/29/21  9:07 AM   Specimen: Nasal Mucosa; Nasal Swab  Result Value Ref Range Status   MRSA, PCR NEGATIVE NEGATIVE Final   Staphylococcus aureus NEGATIVE NEGATIVE Final    Comment: (NOTE) The Xpert SA Assay (FDA approved for NASAL specimens in  patients 73 years of age and older), is one component of a comprehensive surveillance program. It is not intended to diagnose infection nor to guide or monitor treatment. Performed at [facility], [address] [place], [place] [address]          Radiology Studies: DG C-Arm 1-60 Min-No Report  Result Date: 11/29/2021 Fluoroscopy was utilized by the requesting physician.  No radiographic interpretation.   DG C-Arm 1-60 Min-No Report  Result Date: 11/29/2021 Fluoroscopy was utilized by the requesting physician.  No radiographic interpretation.   DG HIP UNILAT WITH PELVIS 1V RIGHT  Result Date: 11/29/2021 CLINICAL DATA:  Right hip fracture. EXAM: DG HIP (WITH OR WITHOUT PELVIS) 1V RIGHT COMPARISON:  11/28/2020 FINDINGS: Right hip replacement in satisfactory position and alignment. No fracture or complication IMPRESSION: Right hip replacement for right femoral neck fracture. Electronically Signed   By: Marlan Palauharles  Clark M.D.   On: 11/29/2021 17:16   DG HIP UNILAT W OR W/O PELVIS 2-3 VIEWS RIGHT  Result Date: 11/29/2021 CLINICAL DATA:  Postoperative pain. EXAM: DG HIP (WITH OR WITHOUT PELVIS) 2-3V RIGHT COMPARISON:  Fluoroscopic images of same day. FINDINGS: Status post right hip arthroplasty. Expected postoperative changes are seen in the surrounding soft tissues. No fracture or dislocation is noted. IMPRESSION: Status post right hip arthroplasty. Electronically Signed   By: Lupita RaiderJames  Green Jr M.D.   On: 11/29/2021 19:21        Scheduled Meds:  vitamin C  500 mg Oral Daily   calcium-vitamin D  1 tablet Oral Q breakfast   Chlorhexidine Gluconate Cloth  6 each Topical Daily   cholecalciferol  1,000 Units Oral Daily   docusate sodium  100 mg Oral BID   enoxaparin (LOVENOX) injection  40 mg Subcutaneous Q24H   ezetimibe  10 mg Oral Daily   feeding supplement  237 mL Oral BID BM   FLUoxetine  20 mg Oral QHS   magnesium hydroxide  30 mL Oral QHS   multivitamin with minerals  1  tablet Oral Daily   multivitamin-lutein  1 capsule Oral Daily   pantoprazole  40 mg Oral Daily   potassium chloride  40 mEq Oral BID   zinc sulfate  220 mg Oral Daily   Continuous Infusions:  methocarbamol (ROBAXIN) IV       LOS: 2 days    Time spent: 35 minutes    Dorcas CarrowKuber Rheannon Cerney, MD [facility] Pager [phone]

## 2021-12-01 NOTE — TOC Progression Note (Signed)
Transition of Care [facility]) - Progression Note    Patient Details  Name: Gabrielle Salinas MRN: [id number] Date of Birth: 01/23/1951  Transition of Care [facility]) CM/SW Contact  Marlowe Sax, RN Phone Number: 12/01/2021, 3:11 PM  Clinical Narrative:    [facility] has accepted the patient for home health services, I notified the patient and let her know that they will call to set up, the DME was delivered to the room        Expected Discharge Plan and Services                                                 Social Determinants of Health (SDOH) Interventions    Readmission Risk Interventions No flowsheet data found.

## 2021-12-01 NOTE — Progress Notes (Signed)
Physical Therapy Treatment Patient Details Name: Gabrielle Salinas MRN: [id number] DOB: 13-Jan-1951 Today's Date: 12/01/2021   History of Present Illness Gabrielle Salinas is a 71 y.o. Caucasian female with medical history significant for anxiety, depression, GERD, dyslipidemia, hypertension, prediabetes, macular degeneration and osteoporosis, who presented to the ER with acute onset of left hip pain after having an accidental mechanical fall. S/p R THA on 11/29/21, anterior THPs    PT Comments    Pt was finishing up with OT session upon arriving. She agrees to PT session and is cooperative and motivated throughout. She was able to STS 3 x from recliner during PT session with CGA. Sons x 2 arrived during session. Pt ambulated a total on 7 laps in her room. She was unwilling to ambulate in hallway but by end of session states, " I walk out in the halls tomorrow." Will address stairs also. She continues to demonstrate improving mobility, transfers, and gait. Will continue current POC and progress as able. Pt requested to get up more frequently with RN staff.    Recommendations for follow up therapy are one component of a multi-disciplinary discharge planning process, led by the attending physician.  Recommendations may be updated based on patient status, additional functional criteria and insurance authorization.  Follow Up Recommendations  Home health PT     Assistance Recommended at Discharge PRN  Patient can return home with the following A little help with bathing/dressing/bathroom;Assist for transportation;Help with stairs or ramp for entrance;A little help with walking and/or transfers   Equipment Recommendations  Rolling walker (2 wheels);BSC/3in1       Precautions / Restrictions Precautions Precautions: Fall;Anterior Hip Precaution Booklet Issued: No Restrictions Weight Bearing Restrictions: Yes RLE Weight Bearing: Weight bearing as tolerated     Mobility  Bed Mobility Overal bed  mobility: Needs Assistance Bed Mobility: Supine to Sit     Supine to sit: Min guard;HOB elevated     General bed mobility comments: Pt was in recliner pre/post session    Transfers Overall transfer level: Needs assistance Equipment used: Rolling walker (2 wheels) Transfers: Sit to/from Stand Sit to Stand: Min guard           General transfer comment: pt was able to stand from [facility] and recliner > RW withoutphysical assistance    Ambulation/Gait Ambulation/Gait assistance: Supervision Gait Distance (Feet): 150 Feet Assistive device: Rolling walker (2 wheels) Gait Pattern/deviations: Step-through pattern;Antalgic Gait velocity: decreased     General Gait Details: pt did not want to ambulate in hallway however did ambulate 7 laps in her room with supervision only    Balance Overall balance assessment: Needs assistance Sitting-balance support: No upper extremity supported;Feet supported Sitting balance-Leahy Scale: Good     Standing balance support: No upper extremity supported;During functional activity Standing balance-Leahy Scale: Fair       Cognition Arousal/Alertness: Awake/alert Behavior During Therapy: WFL for tasks assessed/performed Overall Cognitive Status: Within Functional Limits for tasks assessed    General Comments: Pt is A and O x 4 and much more alert today         Pertinent Vitals/Pain Pain Assessment: 0-10 Pain Score: 3  Pain Location: R hip Pain Descriptors / Indicators: Aching;Grimacing;Guarding Pain Intervention(s): Monitored during session;Limited activity within patient's tolerance;Premedicated before session     PT Goals (current goals can now be found in the care plan section) Acute Rehab PT Goals Patient Stated Goal: " I want to go home when my son think I can." Progress towards PT goals:  Progressing toward goals    Frequency    BID      PT Plan Current plan remains appropriate       AM-PAC PT "6 Clicks" Mobility    Outcome Measure  Help needed turning from your back to your side while in a flat bed without using bedrails?: None Help needed moving from lying on your back to sitting on the side of a flat bed without using bedrails?: A Little Help needed moving to and from a bed to a chair (including a wheelchair)?: A Little Help needed standing up from a chair using your arms (e.g., wheelchair or bedside chair)?: A Little Help needed to walk in hospital room?: A Little Help needed climbing 3-5 steps with a railing? : A Little 6 Click Score: 19    End of Session Equipment Utilized During Treatment: Gait belt Activity Tolerance: Patient tolerated treatment well Patient left: in chair;with call bell/phone within reach;with chair alarm set Nurse Communication: Mobility status PT Visit Diagnosis: Muscle weakness (generalized) (M62.81);Difficulty in walking, not elsewhere classified (R26.2);Pain Pain - Right/Left: Right Pain - part of body: Hip     Time: 1093-2355 PT Time Calculation (min) (ACUTE ONLY): 24 min  Charges:  $Gait Training: 8-22 mins $Therapeutic Activity: 8-22 mins                    Jetta Lout PTA 12/01/21, 5:21 PM

## 2021-12-01 NOTE — Progress Notes (Signed)
Occupational Therapy Treatment Patient Details Name: Gabrielle Salinas MRN: [id number] DOB: Jul 01, 1951 Today's Date: 12/01/2021   History of present illness Gabrielle Salinas is a 71 y.o. Caucasian female with medical history significant for anxiety, depression, GERD, dyslipidemia, hypertension, prediabetes, macular degeneration and osteoporosis, who presented to the ER with acute onset of left hip pain after having an accidental mechanical fall. S/p R THA on 11/29/21, anterior THPs   OT comments  Gabrielle Salinas was seen for OT treatment on this date. Upon arrival to room pt reclined in bed, agreeable to tx. Pt reports concerns re: returning home. Pt required no physical assist, significant time to exit L side of bed with use of bed rails. Initial MIN A + RW to rise from elevated bed height with poor recall of RW technique however rises easily with arm rest use from [facility]. SBA perihygiene in standing. Son and PT in to room, handed off in standing.   Pt making good progress toward goals. Pt continues to benefit from skilled OT services to maximize return to PLOF and minimize risk of future falls, injury, caregiver burden, and readmission. Will continue to follow POC. Discharge recommendation updated to [facility] with SUPERVISION for mobility/ADLs. If pt unable to have initial assist at home pt may be appropriate for STR.    Recommendations for follow up therapy are one component of a multi-disciplinary discharge planning process, led by the attending physician.  Recommendations may be updated based on patient status, additional functional criteria and insurance authorization.    Follow Up Recommendations  Home health OT (STR appropriate if no assist at home)   Assistance Recommended at Discharge Intermittent Supervision/Assistance  Patient can return home with the following  A little help with walking and/or transfers;A lot of help with bathing/dressing/bathroom;Help with stairs or ramp for entrance    Equipment Recommendations  BSC/3in1    Recommendations for Other Services      Precautions / Restrictions Precautions Precautions: Fall;Anterior Hip Precaution Booklet Issued: No Restrictions Weight Bearing Restrictions: Yes RLE Weight Bearing: Weight bearing as tolerated       Mobility Bed Mobility Overal bed mobility: Needs Assistance Bed Mobility: Supine to Sit     Supine to sit: Min guard;HOB elevated     General bed mobility comments: increased time and HOB ~30*    Transfers Overall transfer level: Needs assistance Equipment used: Rolling walker (2 wheels) Transfers: Sit to/from Stand Sit to Stand: Min guard           General transfer comment: MIN A to rise from elevated bed height however rises easily with arm rest use from [facility]     Balance Overall balance assessment: Needs assistance Sitting-balance support: No upper extremity supported;Feet supported Sitting balance-Leahy Scale: Good     Standing balance support: No upper extremity supported;During functional activity Standing balance-Leahy Scale: Fair                             ADL either performed or assessed with clinical judgement   ADL Overall ADL's : Needs assistance/impaired                                       General ADL Comments: MIN A + RW improving to CGA + RW with proper technique and use of arm rests. SBA perihygiene in standing. MIN A to rise from elevated bed  height however rises easily with arm rest use from [facility]      Cognition Arousal/Alertness: Awake/alert Behavior During Therapy: WFL for tasks assessed/performed Overall Cognitive Status: Within Functional Limits for tasks assessed                                 General Comments: Pt is A and O x 4 and much more alert today                     Pertinent Vitals/ Pain       Pain Assessment: 0-10 Pain Score: 7  Faces Pain Scale: Hurts a little bit Pain Location: R  hip Pain Descriptors / Indicators: Aching;Grimacing;Guarding Pain Intervention(s): Limited activity within patient's tolerance;Premedicated before session;Repositioned     Prior Functioning/Environment              Frequency  Min 3X/week        Progress Toward Goals  OT Goals(current goals can now be found in the care plan section)  Progress towards OT goals: Progressing toward goals  Acute Rehab OT Goals Patient Stated Goal: to return to PLOF OT Goal Formulation: With patient/family Time For Goal Achievement: 12/14/21 Potential to Achieve Goals: Good ADL Goals Pt Will Perform Grooming: standing;with min assist Pt Will Perform Lower Body Dressing: with min assist;with caregiver independent in assisting;with adaptive equipment;sit to/from stand Pt Will Transfer to Toilet: with min assist;ambulating;bedside commode  Plan Frequency remains appropriate;Discharge plan needs to be updated    Co-evaluation                 AM-PAC OT "6 Clicks" Daily Activity     Outcome Measure   Help from another person eating meals?: None Help from another person taking care of personal grooming?: A Little Help from another person toileting, which includes using toliet, bedpan, or urinal?: A Little Help from another person bathing (including washing, rinsing, drying)?: A Lot Help from another person to put on and taking off regular upper body clothing?: A Little Help from another person to put on and taking off regular lower body clothing?: A Lot 6 Click Score: 17    End of Session Equipment Utilized During Treatment: Rolling walker (2 wheels)  OT Visit Diagnosis: Unsteadiness on feet (R26.81);Other abnormalities of gait and mobility (R26.89)   Activity Tolerance Patient tolerated treatment well   Patient Left  (with PT in room)   Nurse Communication          Time: 1103-1594 OT Time Calculation (min): 25 min  Charges: OT General Charges $OT Visit: 1 Visit OT  Treatments $Self Care/Home Management : 23-37 mins  Kathie Dike, M.S. OTR/L  12/01/21, 4:38 PM  ascom [phone]

## 2021-12-02 LAB — CBC WITH DIFFERENTIAL/PLATELET
Abs Immature Granulocytes: 0.12 10*3/uL — ABNORMAL HIGH (ref 0.00–0.07)
Basophils Absolute: 0 10*3/uL (ref 0.0–0.1)
Basophils Relative: 0 %
Eosinophils Absolute: 0.3 10*3/uL (ref 0.0–0.5)
Eosinophils Relative: 2 %
HCT: 27.5 % — ABNORMAL LOW (ref 36.0–46.0)
Hemoglobin: 9.2 g/dL — ABNORMAL LOW (ref 12.0–15.0)
Immature Granulocytes: 1 %
Lymphocytes Relative: 17 %
Lymphs Abs: 1.9 10*3/uL (ref 0.7–4.0)
MCH: 32.3 pg (ref 26.0–34.0)
MCHC: 33.5 g/dL (ref 30.0–36.0)
MCV: 96.5 fL (ref 80.0–100.0)
Monocytes Absolute: 1 10*3/uL (ref 0.1–1.0)
Monocytes Relative: 9 %
Neutro Abs: 7.7 10*3/uL (ref 1.7–7.7)
Neutrophils Relative %: 71 %
Platelets: 170 10*3/uL (ref 150–400)
RBC: 2.85 MIL/uL — ABNORMAL LOW (ref 3.87–5.11)
RDW: 13.2 % (ref 11.5–15.5)
WBC: 11.1 10*3/uL — ABNORMAL HIGH (ref 4.0–10.5)
nRBC: 0 % (ref 0.0–0.2)

## 2021-12-02 NOTE — TOC Progression Note (Signed)
Transition of Care [facility]) - Progression Note    Patient Details  Name: Gabrielle Salinas MRN: [id number] Date of Birth: 05-Jun-1951  [facility] CM/SW [place], RN Phone Number: 12/02/2021, 9:54 AM  Clinical Narrative:   Met with the patient and her two sons in the room to discuss DC Plan, I explained that she is doing much better physically and the recommendation is HH, I explained medicare guidelines and why Insurance will not cover STR if she is doing well enough for Home health, I explained that they could go to STR but it would be pay out of pocket, a RW and 3 in 1 was delivered to the room 12/01/21, The patient was accepted by [facility] for [facility] services, I explained how often HH comes to the home and encouraged the sons to see if they can get friends and or family to take turns coming to assist the patient for at least a week.  I explained that insurance does not cover someone to sit with her I explained that they have some companies locally that can be hired to stay with the patient at min 4 hours each time, I explained once the patient is medically stable she will be discharged home, the plan is to be discharged over the weekend, the patient and the sons are aware and agree         Expected Discharge Plan and Services                                                 Social Determinants of Health (SDOH) Interventions    Readmission Risk Interventions No flowsheet data found.

## 2021-12-02 NOTE — Progress Notes (Signed)
Physical Therapy Treatment Patient Details Name: Gabrielle Salinas MRN: [id number] DOB: [birth year] Today's Date: 12/02/2021   History of Present Illness Gabrielle Salinas is a 71 y.o. Caucasian female with medical history significant for anxiety, depression, GERD, dyslipidemia, hypertension, prediabetes, macular degeneration and osteoporosis, who presented to the ER with acute onset of left hip pain after having an accidental mechanical fall. S/p R THA on 11/29/21, anterior THPs    PT Comments    Pt was long sitting in bed upon arriving. She endorses being fatigued and requested not getting OOB. She did however fully participate in HEP handout. Pt's sao2 95% on rm air. See exercises performed below. PT will return in the morning and continue to follow per current POC. Pt is planning to DC home with HHPT to follow.    Recommendations for follow up therapy are one component of a multi-disciplinary discharge planning process, led by the attending physician.  Recommendations may be updated based on patient status, additional functional criteria and insurance authorization.  Follow Up Recommendations  Home health PT     Assistance Recommended at Discharge PRN  Patient can return home with the following A little help with bathing/dressing/bathroom;Assist for transportation;Help with stairs or ramp for entrance;A little help with walking and/or transfers   Equipment Recommendations  None recommended by PT       Precautions / Restrictions Precautions Precautions: Fall;Anterior Hip Precaution Booklet Issued: Yes (comment) Restrictions Weight Bearing Restrictions: Yes RLE Weight Bearing: Weight bearing as tolerated     Mobility  Bed Mobility    General bed mobility comments: pt requested not to get OOB. She did fully perticipate in performing HEP.       Balance Overall balance assessment: Needs assistance Sitting-balance support: No upper extremity supported;Feet supported Sitting  balance-Leahy Scale: Good     Standing balance support: No upper extremity supported;During functional activity Standing balance-Leahy Scale: Fair       Cognition Arousal/Alertness: Awake/alert Behavior During Therapy: WFL for tasks assessed/performed Overall Cognitive Status: Within Functional Limits for tasks assessed    General Comments: Pt is A and O x 4. requested not to get back OOB but did agree to performing ther ex in bed. issued HEP handout and performed.        Exercises Total Joint Exercises Ankle Circles/Pumps: AROM;10 reps Quad Sets: AROM;10 reps Gluteal Sets: AROM;10 reps Heel Slides: AROM;10 reps Straight Leg Raises: AAROM;10 reps        Pertinent Vitals/Pain Pain Assessment: 0-10 Pain Score: 7  Pain Location: R hip Pain Descriptors / Indicators: Aching;Grimacing;Guarding Pain Intervention(s): Limited activity within patient's tolerance;Monitored during session;Premedicated before session;Repositioned     PT Goals (current goals can now be found in the care plan section) Acute Rehab PT Goals Patient Stated Goal: planning on going home tomorrow. Progress towards PT goals: Progressing toward goals    Frequency    BID      PT Plan Current plan remains appropriate       AM-PAC PT "6 Clicks" Mobility   Outcome Measure  Help needed turning from your back to your side while in a flat bed without using bedrails?: None Help needed moving from lying on your back to sitting on the side of a flat bed without using bedrails?: A Little Help needed moving to and from a bed to a chair (including a wheelchair)?: A Little Help needed standing up from a chair using your arms (e.g., wheelchair or bedside chair)?: A Little Help needed to walk in  hospital room?: A Little Help needed climbing 3-5 steps with a railing? : A Little 6 Click Score: 19    End of Session Equipment Utilized During Treatment: Gait belt Activity Tolerance: Patient tolerated treatment  well Patient left: in chair;with call bell/phone within reach;with chair alarm set Nurse Communication: Mobility status PT Visit Diagnosis: Muscle weakness (generalized) (M62.81);Difficulty in walking, not elsewhere classified (R26.2);Pain Pain - Right/Left: Right Pain - part of body: Hip     Time: 1420-1440 PT Time Calculation (min) (ACUTE ONLY): 20 min  Charges:  $Therapeutic Exercise: 8-22 mins          Jetta Lout PTA 12/02/21, 2:59 PM

## 2021-12-02 NOTE — Progress Notes (Signed)
° °  Subjective: 3 Days Post-Op Procedure(s) (LRB): TOTAL HIP ARTHROPLASTY ANTERIOR APPROACH (Right) Patient reports pain as moderate.   Patient is well, and has had no acute complaints or problems Denies any CP, SOB, ABD pain. We will continue therapy today.  Plan is to go to home   Objective: Vital signs in last 24 hours: Temp:  [97.3 F (36.3 C)-98.6 F (37 C)] 98.1 F (36.7 C) (01/06 0745) Pulse Rate:  [88-100] 96 (01/06 0745) Resp:  [15-20] 15 (01/06 0745) BP: (115-130)/(51-63) 120/54 (01/06 0745) SpO2:  [90 %-97 %] 90 % (01/06 0745)  Intake/Output from previous day: 01/05 0701 - 01/06 0700 In: -  Out: 1 [Urine:1] Intake/Output this shift: No intake/output data recorded.  Recent Labs    11/30/21 0424 12/01/21 0429 12/02/21 0721  HGB 10.6* 10.1* 9.2*   Recent Labs    12/01/21 0429 12/02/21 0721  WBC 11.4* 11.1*  RBC 3.06* 2.85*  HCT 29.0* 27.5*  PLT 152 170   Recent Labs    11/30/21 0424 12/01/21 0429  NA 130* 130*  K 3.0* 3.9  CL 93* 93*  CO2 26 29  BUN 16 16  CREATININE 0.75 0.60  GLUCOSE 167* 165*  CALCIUM 7.8* 8.0*   No results for input(s): LABPT, INR in the last 72 hours.   EXAM General - Patient is Alert, Appropriate, and Oriented Extremity - Neurovascular intact Sensation intact distally Intact pulses distally Dorsiflexion/Plantar flexion intact No cellulitis present Compartment soft Dressing - dressing C/D/I and no drainage, provena intact Motor Function - intact, moving foot and toes well on exam.   Past Medical History:  Diagnosis Date   Allergy    Anxiety    Cataract    Depression    GERD (gastroesophageal reflux disease)    Hyperlipidemia    Hypertension    Osteoporosis     Assessment/Plan:   3 Days Post-Op Procedure(s) (LRB): TOTAL HIP ARTHROPLASTY ANTERIOR APPROACH (Right) Principal Problem:   Closed right hip fracture (HCC) Active Problems:   COVID-19 virus infection  Estimated body mass index is 29.05 kg/m  as calculated from the following:   Height as of this encounter: 5\' 3"  (1.6 m).   Weight as of this encounter: 74.4 kg. Advance diet Up with therapy VSS Labs stable CM to assist with discharge to home with [place] and apply honeycomb dressing 7 days following discharge from hospital. Lovenox 40 mg subcu daily x14 days at discharge TED hose bilateral lower extremity x6 weeks Follow-up with [facility] orthopedics in 2 weeks    DVT Prophylaxis - Lovenox, TED hose, and SCDS Weight-Bearing as tolerated to Right leg   T. Rachelle Hora, PA-C [place] 12/02/2021, 8:00 AM

## 2021-12-02 NOTE — Plan of Care (Signed)

## 2021-12-02 NOTE — Progress Notes (Signed)
PROGRESS NOTE    Gabrielle Salinas  [id number] DOB: 1951/09/13 DOA: 11/28/2021 PCP: Birdie Sons, MD    Brief Narrative:   multiple medical issues as well as right hip fracture after accidental mechanical fall at home.  She tripped over her shoes and fell on her right side.  Patient also found to have COVID-19 positivity.  She does have some cough and congestion but no hypoxemia.   Assessment & Plan:   Principal Problem:   Closed right hip fracture ([place]) Active Problems:   COVID-19 virus infection  Close traumatic right hip fracture: Status post right total hip 1/3.  Wound VAC and postop management as per surgery. Lovenox for DVT prophylaxis. Weightbearing as tolerated.   Pain managed with oxycodone, Robaxin and occasional morphine. Discharge plan Lovenox for 2 weeks Wound VAC removal in 1 week at home Working with PT OT recommended home health PT OT.  COVID-19 virus infection: Has some congestion.  No pneumonia or hypoxemia.  Symptomatic treatment.  Cough medications. Patient reported she was positive for COVID-19 in November and subsequently tested negative. CT value 18 from the COVID test on 1/3 indicating active infection. Isolation precautions for 7 days.  Anxiety and depression: On Xanax and Prozac that she will continue.  Essential hypertension: Antihypertensives on hold due to significant orthostatic blood pressures drop.  Currently improved.  Continue to hold antihypertensives today.  Hyponatremia: Due to dehydration.  Treated with isotonic fluid with improvement.  Gradually stabilizing.  Hypokalemia: Replaced.  Improved.  Discussed with patient's 2 children at the bedside.  PT OT recommended home health.  Patient lives alone at home.  Family to arrange for additional help at home before discharge.  DVT prophylaxis: enoxaparin (LOVENOX) injection 40 mg Start: 11/30/21 1000 SCDs Start: 11/29/21 1844 Place TED hose Start: 11/29/21 1844 SCDs Start: 11/29/21  0056   Code Status: Full code Family Communication: Sons at the bedside. Disposition Plan: Status is: Inpatient  Remains inpatient appropriate because: Unsafe discharge.  Medically stabilizing.         Consultants:  Orthopedics  Procedures:  Right total hip  Antimicrobials:  Remdesivir 1/3-1/4.  Discontinued   Subjective: Seen and examined.  Right hip hurting more after mobility.  No dizziness or lightheadedness today on ambulating.  No orthostatic. 2 of her sons are at the bedside, we discussed about discharge planning in details.  Patient lives herself and they do not have adequate support system.  Family to arrange for adequate support system before discharge likely tomorrow.    Objective: Vitals:   12/01/21 1548 12/01/21 2057 12/02/21 0411 12/02/21 0745  BP: (!) 125/51 130/60 (!) 122/52 (!) 120/54  Pulse: 88 90 93 96  Resp: 16 18 20 15   Temp: 97.8 F (36.6 C) 98 F (36.7 C) 98.2 F (36.8 C) 98.1 F (36.7 C)  TempSrc:      SpO2: 91% 93% 97% 90%  Weight:      Height:       No intake or output data in the 24 hours ending 12/02/21 1147  Filed Weights   11/28/21 2223  Weight: 74.4 kg    Examination:  General: Looks comfortable. Cardiovascular: S1-S2 normal.  Regular rate rhythm. Respiratory: Bilateral clear.  On room air. Gastrointestinal: Soft.  Nontender.  Bowel sound present. Ext: No swelling or edema. Right anterior thigh incision looks clean and dry.  Minimal edema surrounding the incision.  Wound VAC in place.     Data Reviewed: I have personally reviewed following labs and  imaging studies  CBC: Recent Labs  Lab 11/29/21 0032 11/30/21 0424 12/01/21 0429 12/02/21 0721  WBC 9.6 8.5 11.4* 11.1*  NEUTROABS  --  5.9 7.7 7.7  HGB 14.1 10.6* 10.1* 9.2*  HCT 41.2 31.7* 29.0* 27.5*  MCV 95.2 94.9 94.8 96.5  PLT 204 170 152 [id number]   Basic Metabolic Panel: Recent Labs  Lab 11/29/21 0032 11/30/21 0424 12/01/21 0429  NA 129* 130* 130*  K 3.5  3.0* 3.9  CL 96* 93* 93*  CO2 24 26 29   GLUCOSE 139* 167* 165*  BUN 13 16 16   CREATININE 0.72 0.75 0.60  CALCIUM 8.3* 7.8* 8.0*  MG  --   --  2.1   GFR: Estimated Creatinine Clearance: 61.4 mL/min (by C-G formula based on SCr of 0.6 mg/dL). Liver Function Tests: Recent Labs  Lab 11/30/21 0424  AST 31  ALT 28  ALKPHOS 92  BILITOT 0.5  PROT 5.8*  ALBUMIN 2.8*   No results for input(s): LIPASE, AMYLASE in the last 168 hours. No results for input(s): AMMONIA in the last 168 hours. Coagulation Profile: Recent Labs  Lab 11/29/21 0032  INR 1.1   Cardiac Enzymes: No results for input(s): CKTOTAL, CKMB, CKMBINDEX, TROPONINI in the last 168 hours. BNP (last 3 results) No results for input(s): PROBNP in the last 8760 hours. HbA1C: No results for input(s): HGBA1C in the last 72 hours. CBG: No results for input(s): GLUCAP in the last 168 hours. Lipid Profile: No results for input(s): CHOL, HDL, LDLCALC, TRIG, CHOLHDL, LDLDIRECT in the last 72 hours. Thyroid Function Tests: No results for input(s): TSH, T4TOTAL, FREET4, T3FREE, THYROIDAB in the last 72 hours. Anemia Panel: Recent Labs    11/30/21 0424  FERRITIN 106   Sepsis Labs: Recent Labs  Lab 11/29/21 0717  PROCALCITON <0.10    Recent Results (from the past 240 hour(s))  Resp Panel by RT-PCR (Flu A&B, Covid) Nasopharyngeal Swab     Status: Abnormal   Collection Time: 11/29/21 12:32 AM   Specimen: Nasopharyngeal Swab; Nasopharyngeal(NP) swabs in vial transport medium  Result Value Ref Range Status   SARS Coronavirus 2 by RT PCR POSITIVE (A) NEGATIVE Final    Comment: (NOTE) SARS-CoV-2 target nucleic acids are DETECTED.  The SARS-CoV-2 RNA is generally detectable in upper respiratory specimens during the acute phase of infection. Positive results are indicative of the presence of the identified virus, but do not rule out bacterial infection or co-infection with other pathogens not detected by the test. Clinical  correlation with patient history and other diagnostic information is necessary to determine patient infection status. The expected result is Negative.  Fact Sheet for Patients: EntrepreneurPulse.com.au  Fact Sheet for Healthcare Providers: IncredibleEmployment.be  This test is not yet approved or cleared by the [place] FDA and  has been authorized for detection and/or diagnosis of SARS-CoV-2 by FDA under an Emergency Use Authorization (EUA).  This EUA will remain in effect (meaning this test can be used) for the duration of  the COVID-19 declaration under Section 564(b)(1) of the A ct, 21 U.S.C. section 360bbb-3(b)(1), unless the authorization is terminated or revoked sooner.     Influenza A by PCR NEGATIVE NEGATIVE Final   Influenza B by PCR NEGATIVE NEGATIVE Final    Comment: (NOTE) The Xpert Xpress SARS-CoV-2/FLU/RSV plus assay is intended as an aid in the diagnosis of influenza from Nasopharyngeal swab specimens and should not be used as a sole basis for treatment. Nasal washings and aspirates are unacceptable for Xpert Xpress  SARS-CoV-2/FLU/RSV testing.  Fact Sheet for Patients: EntrepreneurPulse.com.au  Fact Sheet for Healthcare Providers: IncredibleEmployment.be  This test is not yet approved or cleared by the [place] FDA and has been authorized for detection and/or diagnosis of SARS-CoV-2 by FDA under an Emergency Use Authorization (EUA). This EUA will remain in effect (meaning this test can be used) for the duration of the COVID-19 declaration under Section 564(b)(1) of the Act, 21 U.S.C. section 360bbb-3(b)(1), unless the authorization is terminated or revoked.  Performed at [facility], [address] [place], [place] [address]   Surgical pcr screen     Status: None   Collection Time: 11/29/21  9:07 AM   Specimen: Nasal Mucosa; Nasal Swab  Result Value Ref Range  Status   MRSA, PCR NEGATIVE NEGATIVE Final   Staphylococcus aureus NEGATIVE NEGATIVE Final    Comment: (NOTE) The Xpert SA Assay (FDA approved for NASAL specimens in patients 12 years of age and older), is one component of a comprehensive surveillance program. It is not intended to diagnose infection nor to guide or monitor treatment. Performed at [facility], [address], [place], [place] [address]          Radiology Studies: CT HEAD WO CONTRAST (5MM)  Result Date: 12/01/2021 CLINICAL DATA:  Headache, frequent falls, recent head injury EXAM: CT HEAD WITHOUT CONTRAST TECHNIQUE: Contiguous axial images were obtained from the base of the skull through the vertex without intravenous contrast. COMPARISON:  None. FINDINGS: Brain: Scattered hypodensities are seen throughout the periventricular and subcortical white matter as well as the left basal ganglia, consistent with age-indeterminate small vessel ischemic changes. No other signs of acute infarct or hemorrhage. Lateral ventricles and remaining midline structures are unremarkable. No acute extra-axial collections. No mass effect. Vascular: No hyperdense vessel or unexpected calcification. Skull: Normal. Negative for fracture or focal lesion. Sinuses/Orbits: Near complete opacification of the bilateral maxillary sinuses and anterior ethmoid air cells. Other: None. IMPRESSION: 1. Age-indeterminate small-vessel ischemic changes throughout the periventricular white matter and left basal ganglia, favor chronic. 2. No acute hemorrhage. Electronically Signed   By: Randa Ngo M.D.   On: 12/01/2021 22:43        Scheduled Meds:  vitamin C  500 mg Oral Daily   calcium-vitamin D  1 tablet Oral Q breakfast   Chlorhexidine Gluconate Cloth  6 each Topical Daily   cholecalciferol  1,000 Units Oral Daily   docusate sodium  100 mg Oral BID   enoxaparin (LOVENOX) injection  40 mg Subcutaneous Q24H   ezetimibe  10 mg Oral Daily   feeding  supplement  237 mL Oral BID BM   FLUoxetine  20 mg Oral QHS   magnesium hydroxide  30 mL Oral QHS   multivitamin with minerals  1 tablet Oral Daily   multivitamin-lutein  1 capsule Oral Daily   pantoprazole  40 mg Oral Daily   zinc sulfate  220 mg Oral Daily   Continuous Infusions:  methocarbamol (ROBAXIN) IV       LOS: 3 days    Time spent: 35 minutes    Barb Merino, MD [facility] Pager [phone]

## 2021-12-02 NOTE — Progress Notes (Signed)
Physical Therapy Treatment Patient Details Name: Gabrielle Salinas MRN: [id number] DOB: 1951/06/30 Today's Date: 12/02/2021   History of Present Illness Gabrielle Salinas is a 71 y.o. Caucasian female with medical history significant for anxiety, depression, GERD, dyslipidemia, hypertension, prediabetes, macular degeneration and osteoporosis, who presented to the ER with acute onset of left hip pain after having an accidental mechanical fall. S/p R THA on 11/29/21, anterior THPs    PT Comments    Pt was sitting in recliner upon arriving. She is A and O x 4 and cooperative throughout. Endorses feeling very sore. Reports 7/10 pain during wt bearing activity. She was able to stand to RW, ambulate ~300 ft and perform ascending/descending stairs without physical assistance. Will return this afternoon for PM session. Recommend DC home with HHPT to follow.   Recommendations for follow up therapy are one component of a multi-disciplinary discharge planning process, led by the attending physician.  Recommendations may be updated based on patient status, additional functional criteria and insurance authorization.  Follow Up Recommendations  Home health PT     Assistance Recommended at Discharge PRN  Patient can return home with the following A little help with bathing/dressing/bathroom;Assist for transportation;Help with stairs or ramp for entrance;A little help with walking and/or transfers   Equipment Recommendations  Rolling walker (2 wheels);BSC/3in1 (pt has her new personal equipment in room already)       Precautions / Restrictions Precautions Precautions: Fall;Anterior Hip Precaution Booklet Issued: No Restrictions Weight Bearing Restrictions: Yes RLE Weight Bearing: Weight bearing as tolerated     Mobility  Bed Mobility    General bed mobility comments: pt was sitting in recliner upon arrival. She states she was able to get to recliner without assistance.    Transfers Overall  transfer level: Needs assistance Equipment used: Rolling walker (2 wheels) Transfers: Sit to/from Stand Sit to Stand: Min guard           General transfer comment: vcs for improved technique but able to stand without physical assistance    Ambulation/Gait Ambulation/Gait assistance: Supervision Gait Distance (Feet): 300 Feet Assistive device: Rolling walker (2 wheels) Gait Pattern/deviations: Step-through pattern;Antalgic Gait velocity: decreased     General Gait Details: pt was able to ambulate 300 ft with 4 standing rest breaks   Stairs Stairs: Yes Stairs assistance: Supervision Stair Management: Two rails;Step to pattern;Forwards Number of Stairs: 4 General stair comments: pt performed ascending/descending stairs without physical assistance.      Balance Overall balance assessment: Needs assistance Sitting-balance support: No upper extremity supported;Feet supported Sitting balance-Leahy Scale: Good     Standing balance support: No upper extremity supported;During functional activity Standing balance-Leahy Scale: Fair       Cognition Arousal/Alertness: Awake/alert Behavior During Therapy: WFL for tasks assessed/performed Overall Cognitive Status: Within Functional Limits for tasks assessed      General Comments: Pt is A and O x 4 and much more alert today               Pertinent Vitals/Pain Pain Assessment: 0-10 Pain Score: 7  Pain Location: R hip Pain Descriptors / Indicators: Aching;Grimacing;Guarding Pain Intervention(s): Limited activity within patient's tolerance;Monitored during session;Premedicated before session;Repositioned     PT Goals (current goals can now be found in the care plan section) Acute Rehab PT Goals Patient Stated Goal: they said my CT was fine Progress towards PT goals: Progressing toward goals    Frequency    BID      PT Plan Current plan remains  appropriate       AM-PAC PT "6 Clicks" Mobility   Outcome  Measure  Help needed turning from your back to your side while in a flat bed without using bedrails?: None Help needed moving from lying on your back to sitting on the side of a flat bed without using bedrails?: A Little Help needed moving to and from a bed to a chair (including a wheelchair)?: A Little Help needed standing up from a chair using your arms (e.g., wheelchair or bedside chair)?: A Little Help needed to walk in hospital room?: A Little Help needed climbing 3-5 steps with a railing? : A Little 6 Click Score: 19    End of Session   Activity Tolerance: Patient tolerated treatment well Patient left: in chair;with call bell/phone within reach;with chair alarm set Nurse Communication: Mobility status PT Visit Diagnosis: Muscle weakness (generalized) (M62.81);Difficulty in walking, not elsewhere classified (R26.2);Pain Pain - Right/Left: Right Pain - part of body: Hip     Time: 1025-1050 PT Time Calculation (min) (ACUTE ONLY): 25 min  Charges:  $Gait Training: 23-37 mins                     Jetta Lout PTA 12/02/21, 10:58 AM

## 2021-12-03 LAB — CBC WITH DIFFERENTIAL/PLATELET
Abs Immature Granulocytes: 0.15 10*3/uL — ABNORMAL HIGH (ref 0.00–0.07)
Basophils Absolute: 0.1 10*3/uL (ref 0.0–0.1)
Basophils Relative: 1 %
Eosinophils Absolute: 0.3 10*3/uL (ref 0.0–0.5)
Eosinophils Relative: 3 %
HCT: 25.9 % — ABNORMAL LOW (ref 36.0–46.0)
Hemoglobin: 8.5 g/dL — ABNORMAL LOW (ref 12.0–15.0)
Immature Granulocytes: 2 %
Lymphocytes Relative: 18 %
Lymphs Abs: 1.8 10*3/uL (ref 0.7–4.0)
MCH: 31.6 pg (ref 26.0–34.0)
MCHC: 32.8 g/dL (ref 30.0–36.0)
MCV: 96.3 fL (ref 80.0–100.0)
Monocytes Absolute: 1.1 10*3/uL — ABNORMAL HIGH (ref 0.1–1.0)
Monocytes Relative: 11 %
Neutro Abs: 6.6 10*3/uL (ref 1.7–7.7)
Neutrophils Relative %: 65 %
Platelets: 185 10*3/uL (ref 150–400)
RBC: 2.69 MIL/uL — ABNORMAL LOW (ref 3.87–5.11)
RDW: 13.2 % (ref 11.5–15.5)
WBC: 10 10*3/uL (ref 4.0–10.5)
nRBC: 0.5 % — ABNORMAL HIGH (ref 0.0–0.2)

## 2021-12-03 MED ORDER — HYDROCODONE-ACETAMINOPHEN 5-325 MG PO TABS: 1.0000 | ORAL_TABLET | ORAL | 0 refills | Status: DC | PRN

## 2021-12-03 MED ORDER — ENOXAPARIN SODIUM 40 MG/0.4ML IJ SOSY
40.0000 mg | PREFILLED_SYRINGE | INTRAMUSCULAR | 0 refills | Status: DC
Start: 2021-12-03 — End: 2022-04-28

## 2021-12-03 NOTE — Plan of Care (Signed)

## 2021-12-03 NOTE — Progress Notes (Signed)
Physical Therapy Treatment Patient Details Name: Gabrielle Salinas MRN: [id number] DOB: Sep 18, 1951 Today's Date: 12/03/2021   History of Present Illness Gabrielle Salinas is a 71 y.o. Caucasian female with medical history significant for anxiety, depression, GERD, dyslipidemia, hypertension, prediabetes, macular degeneration and osteoporosis, who presented to the ER with acute onset of left hip pain after having an accidental mechanical fall. S/p R THA on 11/29/21, anterior THPs    PT Comments    Pt was sitting in recliner upon arriving. She requested to use BR prior to ambulating into hallway. COVID precautions for hallway ambulation were adhered too. Prior to ambulation she urinated . She wa sable to demonstrate safe steady gait kinematics ambulating 200 ft in hallway. No LOB or safety concern. Does require min assist once returned to room to progress Les back into bed. She will have assistance at DC and is planning to go to son's house who has hospital bed at DC. PT recommends HHPT to follow at DC to progress pt to PLOF.    Recommendations for follow up therapy are one component of a multi-disciplinary discharge planning process, led by the attending physician.  Recommendations may be updated based on patient status, additional functional criteria and insurance authorization.  Follow Up Recommendations  Home health PT     Assistance Recommended at Discharge PRN  Patient can return home with the following A little help with bathing/dressing/bathroom;Assist for transportation;Help with stairs or ramp for entrance;A little help with walking and/or transfers   Equipment Recommendations  None recommended by PT       Precautions / Restrictions Precautions Precautions: Fall;Anterior Hip Precaution Booklet Issued: Yes (comment) Restrictions Weight Bearing Restrictions: Yes RLE Weight Bearing: Weight bearing as tolerated     Mobility  Bed Mobility Overal bed mobility: Needs  Assistance Bed Mobility: Sit to Supine     Supine to sit: HOB elevated;Min assist Sit to supine: Min assist   General bed mobility comments: min assist required to progressLEs into bed and reposition    Transfers Overall transfer level: Needs assistance Equipment used: Rolling walker (2 wheels) Transfers: Sit to/from Stand Sit to Stand: Supervision                Ambulation/Gait Ambulation/Gait assistance: Supervision Gait Distance (Feet): 200 Feet Assistive device: Rolling walker (2 wheels) Gait Pattern/deviations: Step-through pattern;Antalgic Gait velocity: decreased     General Gait Details: Pt was easily and safely able to ambulate 200 ft with hallway without LOB or safety concern. she continues to report that hip feels better after ambulation.   Stairs  General stair comments: pt has performed stairs in previous session and does not want to perform again. Was able to correctly state proper sequencing    Balance Overall balance assessment: Needs assistance Sitting-balance support: No upper extremity supported;Feet supported Sitting balance-Leahy Scale: Good     Standing balance support: No upper extremity supported;During functional activity Standing balance-Leahy Scale: Good       Cognition Arousal/Alertness: Awake/alert Behavior During Therapy: WFL for tasks assessed/performed Overall Cognitive Status: Within Functional Limits for tasks assessed      General Comments: pt is A and O x 4           General Comments General comments (skin integrity, edema, etc.): pt was encouraged to continue to perform HEP throughout the day.      Pertinent Vitals/Pain Pain Assessment: No/denies pain Pain Score: 0-No pain Faces Pain Scale: Hurts a little bit Pain Location: R hip Pain Descriptors /  Indicators: Aching;Grimacing;Guarding Pain Intervention(s): Limited activity within patient's tolerance;Monitored during session     PT Goals (current goals can  now be found in the care plan section) Acute Rehab PT Goals Patient Stated Goal: planning on going to my sons house tomorrow Progress towards PT goals: Progressing toward goals    Frequency    BID      PT Plan Current plan remains appropriate       AM-PAC PT "6 Clicks" Mobility   Outcome Measure  Help needed turning from your back to your side while in a flat bed without using bedrails?: None Help needed moving from lying on your back to sitting on the side of a flat bed without using bedrails?: A Little Help needed moving to and from a bed to a chair (including a wheelchair)?: A Little Help needed standing up from a chair using your arms (e.g., wheelchair or bedside chair)?: A Little Help needed to walk in hospital room?: A Little Help needed climbing 3-5 steps with a railing? : A Little 6 Click Score: 19    End of Session   Activity Tolerance: Patient tolerated treatment well Patient left: with call bell/phone within reach;in bed;with bed alarm set Nurse Communication: Mobility status PT Visit Diagnosis: Muscle weakness (generalized) (M62.81);Difficulty in walking, not elsewhere classified (R26.2);Pain Pain - Right/Left: Right Pain - part of body: Hip     Time: 6283-1517 PT Time Calculation (min) (ACUTE ONLY): 29 min  Charges:  $Gait Training: 8-22 mins $Therapeutic Activity: 8-22 mins                    Jetta Lout PTA 12/03/21, 4:56 PM

## 2021-12-03 NOTE — Progress Notes (Signed)
° °  Subjective: 4 Days Post-Op Procedure(s) (LRB): TOTAL HIP ARTHROPLASTY ANTERIOR APPROACH (Right) Patient reports pain as mild Patient is well, and has had no acute complaints or problems Denies any CP, SOB, ABD pain. We will continue therapy today.  Plan is to go to home with home health PT   Objective: Vital signs in last 24 hours: Temp:  [98.3 F (36.8 C)-98.8 F (37.1 C)] 98.3 F (36.8 C) (01/07 0348) Pulse Rate:  [91-102] 91 (01/07 0348) Resp:  [15-18] 16 (01/07 0348) BP: (123-154)/(55-74) 132/74 (01/07 0348) SpO2:  [95 %-100 %] 96 % (01/07 0348)  Intake/Output from previous day: No intake/output data recorded. Intake/Output this shift: No intake/output data recorded.  Recent Labs    12/01/21 0429 12/02/21 0721 12/03/21 0555  HGB 10.1* 9.2* 8.5*   Recent Labs    12/02/21 0721 12/03/21 0555  WBC 11.1* 10.0  RBC 2.85* 2.69*  HCT 27.5* 25.9*  PLT 170 185   Recent Labs    12/01/21 0429  NA 130*  K 3.9  CL 93*  CO2 29  BUN 16  CREATININE 0.60  GLUCOSE 165*  CALCIUM 8.0*   No results for input(s): LABPT, INR in the last 72 hours.   EXAM General - Patient is Alert, Appropriate, and Oriented Extremity - Neurovascular intact Sensation intact distally Intact pulses distally Dorsiflexion/Plantar flexion intact No cellulitis present Compartment soft Dressing - dressing C/D/I and no drainage, provena intact Motor Function - intact, moving foot and toes well on exam.   Past Medical History:  Diagnosis Date   Allergy    Anxiety    Cataract    Depression    GERD (gastroesophageal reflux disease)    Hyperlipidemia    Hypertension    Osteoporosis     Assessment/Plan:   4 Days Post-Op Procedure(s) (LRB): TOTAL HIP ARTHROPLASTY ANTERIOR APPROACH (Right) Principal Problem:   Closed right hip fracture (HCC) Active Problems:   COVID-19 virus infection  Estimated body mass index is 29.05 kg/m as calculated from the following:   Height as of  this encounter: 5\' 3"  (1.6 m).   Weight as of this encounter: 74.4 kg. Advance diet Up with therapy VSS Labs stable CM to assist with discharge to home with HHPT   Remove Praveena and apply honeycomb dressing 7 days following discharge from hospital. Lovenox 40 mg subcu daily x14 days at discharge TED hose bilateral lower extremity x6 weeks Follow-up with [facility] orthopedics in 2 weeks    DVT Prophylaxis - Lovenox, TED hose, and SCDS Weight-Bearing as tolerated to Right leg   [facility], PA-C [facility] 12/03/2021, 7:57 AM

## 2021-12-03 NOTE — Progress Notes (Signed)
Physical Therapy Treatment Patient Details Name: Gabrielle Salinas MRN: [id number] DOB: [birth year] Today's Date: 12/03/2021   History of Present Illness Gabrielle Salinas is a 71 y.o. Caucasian female with medical history significant for anxiety, depression, GERD, dyslipidemia, hypertension, prediabetes, macular degeneration and osteoporosis, who presented to the ER with acute onset of left hip pain after having an accidental mechanical fall. S/p R THA on 11/29/21, anterior THPs    PT Comments    Pt was long sitting in bed upon arriving. She agrees to session and is cooperative and pleasant throughout. Is A and O x 4. Was able to exit bed with increased time + min assist. Did use bed rails to get out of bed. Bed mobility is the only thing pt still requiring physical assistance for. She is planning to DC to her sons house tomorrow and will have a hospital bed there to use. She stood to RW and ambulated with supervision. No LOB or safety concern. Once repositioned in recliner at conclusion of session, author reviewed importance of continuing to perform HEP.    Recommendations for follow up therapy are one component of a multi-disciplinary discharge planning process, led by the attending physician.  Recommendations may be updated based on patient status, additional functional criteria and insurance authorization.  Follow Up Recommendations  Home health PT     Assistance Recommended at Discharge PRN  Patient can return home with the following A little help with bathing/dressing/bathroom;Assist for transportation;Help with stairs or ramp for entrance;A little help with walking and/or transfers   Equipment Recommendations  None recommended by PT       Precautions / Restrictions Precautions Precautions: Fall;Anterior Hip Precaution Booklet Issued: Yes (comment) Restrictions Weight Bearing Restrictions: Yes RLE Weight Bearing: Weight bearing as tolerated     Mobility  Bed Mobility Overal bed  mobility: Needs Assistance Bed Mobility: Supine to Sit     Supine to sit: HOB elevated;Min assist     General bed mobility comments: min assist required to exit bed    Transfers Overall transfer level: Needs assistance Equipment used: Rolling walker (2 wheels) Transfers: Sit to/from Stand Sit to Stand: Supervision   Ambulation/Gait Ambulation/Gait assistance: Supervision Gait Distance (Feet): 75 Feet Assistive device: Rolling walker (2 wheels) Gait Pattern/deviations: Step-through pattern;Antalgic Gait velocity: decreased     General Gait Details: distance limited by pt needing to use BR   Stairs      General stair comments: pt has performed stairs in previous session and does notwan to perform again. was able to correctly state proper sequencing    Balance Overall balance assessment: Needs assistance Sitting-balance support: No upper extremity supported;Feet supported Sitting balance-Leahy Scale: Good     Standing balance support: No upper extremity supported;During functional activity Standing balance-Leahy Scale: Fair       Cognition Arousal/Alertness: Awake/alert Behavior During Therapy: WFL for tasks assessed/performed Overall Cognitive Status: Within Functional Limits for tasks assessed        General Comments: Pt si A and O x 4 and cooperative and pleasant           General Comments General comments (skin integrity, edema, etc.): pt was encouraged to continue to perform HEP throughout the day.      Pertinent Vitals/Pain Pain Assessment: 0-10 Pain Score: 3  Faces Pain Scale: Hurts a little bit Pain Location: R hip Pain Descriptors / Indicators: Aching;Grimacing;Guarding Pain Intervention(s): Limited activity within patient's tolerance;Monitored during session;Premedicated before session;Repositioned     PT Goals (current goals can  now be found in the care plan section) Acute Rehab PT Goals Patient Stated Goal: planning on going to my sons  house tomorrow Progress towards PT goals: Progressing toward goals    Frequency    BID      PT Plan Current plan remains appropriate       AM-PAC PT "6 Clicks" Mobility   Outcome Measure  Help needed turning from your back to your side while in a flat bed without using bedrails?: None Help needed moving from lying on your back to sitting on the side of a flat bed without using bedrails?: A Little Help needed moving to and from a bed to a chair (including a wheelchair)?: A Little Help needed standing up from a chair using your arms (e.g., wheelchair or bedside chair)?: A Little Help needed to walk in hospital room?: A Little Help needed climbing 3-5 steps with a railing? : A Little 6 Click Score: 19    End of Session   Activity Tolerance: Patient tolerated treatment well Patient left: in chair;with call bell/phone within reach;with chair alarm set Nurse Communication: Mobility status PT Visit Diagnosis: Muscle weakness (generalized) (M62.81);Difficulty in walking, not elsewhere classified (R26.2);Pain Pain - Right/Left: Right Pain - part of body: Hip     Time: 1255-1315 PT Time Calculation (min) (ACUTE ONLY): 20 min  Charges:  $Gait Training: 8-22 mins                     Jetta Lout PTA 12/03/21, 4:49 PM

## 2021-12-03 NOTE — Progress Notes (Signed)
PROGRESS NOTE    Gabrielle Salinas  LTJ:[id number] DOB: 1950-12-16 DOA: 11/28/2021 PCP: Malva Limes, MD    Brief Narrative:   multiple medical issues as well as right hip fracture after accidental mechanical fall at home.  She tripped over her shoes and fell on her right side.  Patient also found to have COVID-19 positivity.  She does have some cough and congestion but no hypoxemia.   Assessment & Plan:   Principal Problem:   Closed right hip fracture (HCC) Active Problems:   COVID-19 virus infection  Close traumatic right hip fracture: Status post right total hip 1/3.   Going home with wound VAC for 7 days after discharge. Lovenox for DVT prophylaxis for 14 days as per orthopedics. Weightbearing as tolerated.   Pain managed with oxycodone, Robaxin. Discharge plan  Lovenox for 2 weeks Wound VAC removal in 1 week at home Working with PT OT recommended home health PT OT.  Family trying to make arrangements at home to take her home tomorrow.  COVID-19 virus infection: Has some congestion.  No pneumonia or hypoxemia.  Symptomatic treatment.  Cough medications.  Anxiety and depression: On Xanax and Prozac that she will continue.  Essential hypertension: Blood pressures better.  Will resume antihypertensives gradually.  Hyponatremia: Due to dehydration.  Treated with isotonic fluid with improvement.  Gradually stabilizing.  Hypokalemia: Replaced.  Improved.  Discussed with patient's son at the bedside.  PT OT recommended home health.  Patient lives alone at home.  Family to arrange for additional help at home before discharge. They will probably have adequate arrangements by tomorrow morning. Learn Lovenox injection techniques with nursing staff.  DVT prophylaxis: enoxaparin (LOVENOX) injection 40 mg Start: 11/30/21 1000 SCDs Start: 11/29/21 1844 Place TED hose Start: 11/29/21 1844 SCDs Start: 11/29/21 0056   Code Status: Full code Family Communication: Son at the  bedside. Disposition Plan: Status is: Inpatient  Remains inpatient appropriate because: Unsafe discharge.  Patient is going home with wound VAC, using Lovenox injections.  She does not have adequate family support system in place today.  Family is trying to arrange someone at home and likely can go home tomorrow morning.     Consultants:  Orthopedics  Procedures:  Right total hip  Antimicrobials:  Remdesivir 1/3-1/4.  Discontinued   Subjective:  Seen and examined.  Moderate pain.  Oxycodone has not holding up pain until 6 hours until the next dose.  Dose increased to every 4 hours. Son at the bedside he stated that they will be comfortable taking her home tomorrow morning.  Objective: Vitals:   12/02/21 1641 12/02/21 1954 12/02/21 2336 12/03/21 0348  BP: 140/63 (!) 154/64 (!) 123/55 132/74  Pulse: 100 (!) 102 98 91  Resp: 18 15 15 16   Temp: 98.3 F (36.8 C) 98.8 F (37.1 C) 98.6 F (37 C) 98.3 F (36.8 C)  TempSrc:      SpO2: 99% 100% 95% 96%  Weight:      Height:        Intake/Output Summary (Last 24 hours) at 12/03/2021 1131 Last data filed at 12/03/2021 0654 Gross per 24 hour  Intake --  Output 0 ml  Net 0 ml    Filed Weights   11/28/21 2223  Weight: 74.4 kg    Examination:  General: Looks comfortable. Cardiovascular: S1-S2 normal.  Regular rate rhythm. Respiratory: Bilateral clear.  On room air. Gastrointestinal: Soft.  Nontender.  Bowel sound present. Ext: No swelling or edema. Right anterior thigh incision  looks clean and dry.  Wound VAC in place.     Data Reviewed: I have personally reviewed following labs and imaging studies  CBC: Recent Labs  Lab 11/29/21 0032 11/30/21 0424 12/01/21 0429 12/02/21 0721 12/03/21 0555  WBC 9.6 8.5 11.4* 11.1* 10.0  NEUTROABS  --  5.9 7.7 7.7 6.6  HGB 14.1 10.6* 10.1* 9.2* 8.5*  HCT 41.2 31.7* 29.0* 27.5* 25.9*  MCV 95.2 94.9 94.8 96.5 96.3  PLT 204 170 152 170 185   Basic Metabolic Panel: Recent Labs   Lab 11/29/21 0032 11/30/21 0424 12/01/21 0429  NA 129* 130* 130*  K 3.5 3.0* 3.9  CL 96* 93* 93*  CO2 24 26 29   GLUCOSE 139* 167* 165*  BUN 13 16 16   CREATININE 0.72 0.75 0.60  CALCIUM 8.3* 7.8* 8.0*  MG  --   --  2.1   GFR: Estimated Creatinine Clearance: 61.4 mL/min (by C-G formula based on SCr of 0.6 mg/dL). Liver Function Tests: Recent Labs  Lab 11/30/21 0424  AST 31  ALT 28  ALKPHOS 92  BILITOT 0.5  PROT 5.8*  ALBUMIN 2.8*   No results for input(s): LIPASE, AMYLASE in the last 168 hours. No results for input(s): AMMONIA in the last 168 hours. Coagulation Profile: Recent Labs  Lab 11/29/21 0032  INR 1.1   Cardiac Enzymes: No results for input(s): CKTOTAL, CKMB, CKMBINDEX, TROPONINI in the last 168 hours. BNP (last 3 results) No results for input(s): PROBNP in the last 8760 hours. HbA1C: No results for input(s): HGBA1C in the last 72 hours. CBG: No results for input(s): GLUCAP in the last 168 hours. Lipid Profile: No results for input(s): CHOL, HDL, LDLCALC, TRIG, CHOLHDL, LDLDIRECT in the last 72 hours. Thyroid Function Tests: No results for input(s): TSH, T4TOTAL, FREET4, T3FREE, THYROIDAB in the last 72 hours. Anemia Panel: No results for input(s): VITAMINB12, FOLATE, FERRITIN, TIBC, IRON, RETICCTPCT in the last 72 hours.  Sepsis Labs: Recent Labs  Lab 11/29/21 0717  PROCALCITON <0.10    Recent Results (from the past 240 hour(s))  Resp Panel by RT-PCR (Flu A&B, Covid) Nasopharyngeal Swab     Status: Abnormal   Collection Time: 11/29/21 12:32 AM   Specimen: Nasopharyngeal Swab; Nasopharyngeal(NP) swabs in vial transport medium  Result Value Ref Range Status   SARS Coronavirus 2 by RT PCR POSITIVE (A) NEGATIVE Final    Comment: (NOTE) SARS-CoV-2 target nucleic acids are DETECTED.  The SARS-CoV-2 RNA is generally detectable in upper respiratory specimens during the acute phase of infection. Positive results are indicative of the presence of the  identified virus, but do not rule out bacterial infection or co-infection with other pathogens not detected by the test. Clinical correlation with patient history and other diagnostic information is necessary to determine patient infection status. The expected result is Negative.  Fact Sheet for Patients: 01/27/22  Fact Sheet for Healthcare Providers: 01/27/22  This test is not yet approved or cleared by the [employer] FDA and  has been authorized for detection and/or diagnosis of SARS-CoV-2 by FDA under an Emergency Use Authorization (EUA).  This EUA will remain in effect (meaning this test can be used) for the duration of  the COVID-19 declaration under Section 564(b)(1) of the A ct, 21 U.S.C. section 360bbb-3(b)(1), unless the authorization is terminated or revoked sooner.     Influenza A by PCR NEGATIVE NEGATIVE Final   Influenza B by PCR NEGATIVE NEGATIVE Final    Comment: (NOTE) The Xpert Xpress SARS-CoV-2/FLU/RSV plus assay  is intended as an aid in the diagnosis of influenza from Nasopharyngeal swab specimens and should not be used as a sole basis for treatment. Nasal washings and aspirates are unacceptable for Xpert Xpress SARS-CoV-2/FLU/RSV testing.  Fact Sheet for Patients: BloggerCourse.comhttps://www.fda.gov/media/152166/download  Fact Sheet for Healthcare Providers: SeriousBroker.ithttps://www.fda.gov/media/152162/download  This test is not yet approved or cleared by the [place] FDA and has been authorized for detection and/or diagnosis of SARS-CoV-2 by FDA under an Emergency Use Authorization (EUA). This EUA will remain in effect (meaning this test can be used) for the duration of the COVID-19 declaration under Section 564(b)(1) of the Act, 21 U.S.C. section 360bbb-3(b)(1), unless the authorization is terminated or revoked.  Performed at [facility], [address] [place], [place] [address]   Surgical  pcr screen     Status: None   Collection Time: 11/29/21  9:07 AM   Specimen: Nasal Mucosa; Nasal Swab  Result Value Ref Range Status   MRSA, PCR NEGATIVE NEGATIVE Final   Staphylococcus aureus NEGATIVE NEGATIVE Final    Comment: (NOTE) The Xpert SA Assay (FDA approved for NASAL specimens in patients 73 years of age and older), is one component of a comprehensive surveillance program. It is not intended to diagnose infection nor to guide or monitor treatment. Performed at [facility], [address] [place], [place] [address]          Radiology Studies: CT HEAD WO CONTRAST (5MM)  Result Date: 12/01/2021 CLINICAL DATA:  Headache, frequent falls, recent head injury EXAM: CT HEAD WITHOUT CONTRAST TECHNIQUE: Contiguous axial images were obtained from the base of the skull through the vertex without intravenous contrast. COMPARISON:  None. FINDINGS: Brain: Scattered hypodensities are seen throughout the periventricular and subcortical white matter as well as the left basal ganglia, consistent with age-indeterminate small vessel ischemic changes. No other signs of acute infarct or hemorrhage. Lateral ventricles and remaining midline structures are unremarkable. No acute extra-axial collections. No mass effect. Vascular: No hyperdense vessel or unexpected calcification. Skull: Normal. Negative for fracture or focal lesion. Sinuses/Orbits: Near complete opacification of the bilateral maxillary sinuses and anterior ethmoid air cells. Other: None. IMPRESSION: 1. Age-indeterminate small-vessel ischemic changes throughout the periventricular white matter and left basal ganglia, favor chronic. 2. No acute hemorrhage. Electronically Signed   By: Sharlet SalinaMichael  Brown M.D.   On: 12/01/2021 22:43        Scheduled Meds:  vitamin C  500 mg Oral Daily   calcium-vitamin D  1 tablet Oral Q breakfast   Chlorhexidine Gluconate Cloth  6 each Topical Daily   cholecalciferol  1,000 Units Oral Daily    docusate sodium  100 mg Oral BID   enoxaparin (LOVENOX) injection  40 mg Subcutaneous Q24H   ezetimibe  10 mg Oral Daily   feeding supplement  237 mL Oral BID BM   FLUoxetine  20 mg Oral QHS   magnesium hydroxide  30 mL Oral QHS   multivitamin with minerals  1 tablet Oral Daily   multivitamin-lutein  1 capsule Oral Daily   pantoprazole  40 mg Oral Daily   zinc sulfate  220 mg Oral Daily   Continuous Infusions:  methocarbamol (ROBAXIN) IV       LOS: 4 days    Time spent: 35 minutes    Dorcas CarrowKuber Fareeda Downard, MD [facility] Pager [phone]

## 2021-12-04 LAB — CBC WITH DIFFERENTIAL/PLATELET
Abs Immature Granulocytes: 0.19 10*3/uL — ABNORMAL HIGH (ref 0.00–0.07)
Basophils Absolute: 0.1 10*3/uL (ref 0.0–0.1)
Basophils Relative: 1 %
Eosinophils Absolute: 0.2 10*3/uL (ref 0.0–0.5)
Eosinophils Relative: 2 %
HCT: 25.8 % — ABNORMAL LOW (ref 36.0–46.0)
Hemoglobin: 8.5 g/dL — ABNORMAL LOW (ref 12.0–15.0)
Immature Granulocytes: 2 %
Lymphocytes Relative: 20 %
Lymphs Abs: 1.9 10*3/uL (ref 0.7–4.0)
MCH: 32 pg (ref 26.0–34.0)
MCHC: 32.9 g/dL (ref 30.0–36.0)
MCV: 97 fL (ref 80.0–100.0)
Monocytes Absolute: 1 10*3/uL (ref 0.1–1.0)
Monocytes Relative: 10 %
Neutro Abs: 6.5 10*3/uL (ref 1.7–7.7)
Neutrophils Relative %: 65 %
Platelets: 222 10*3/uL (ref 150–400)
RBC: 2.66 MIL/uL — ABNORMAL LOW (ref 3.87–5.11)
RDW: 13.3 % (ref 11.5–15.5)
WBC: 9.9 10*3/uL (ref 4.0–10.5)
nRBC: 0.3 % — ABNORMAL HIGH (ref 0.0–0.2)

## 2021-12-04 NOTE — TOC Transition Note (Addendum)
Transition of Care [facility]) - CM/SW Discharge Note   Patient Details  Name: Gabrielle Salinas MRN: [id number] Date of Birth: 12/21/1950  Transition of Care [facility]) CM/SW Contact:  Margarito Liner, LCSW Phone Number: 12/04/2021, 11:07 AM   Clinical Narrative:   Patient has orders to discharge home today. Left messages for [facility] representative and [facility] office to notify. No further concerns. CSW signing off.  12:18 pm: Spoke to [facility] office and notified them of discharge for today. CSW signing off.  Final next level of care: Home w Home Health Services Barriers to Discharge: Barriers Resolved   Patient Goals and CMS Choice        Discharge Placement                    Patient and family notified of of transfer: 12/04/21  Discharge Plan and Services                          HH Arranged: PT, OT [facility] Agency: [facility] Date [facility] Agency Contacted: 12/04/21   Representative spoke with at [facility] Agency: Left messages for [facility] office and [facility] representative Alfonzo Beers  Social Determinants of Health (SDOH) Interventions     Readmission Risk Interventions No flowsheet data found.

## 2021-12-04 NOTE — Progress Notes (Signed)
Patient discharged from [facility] Room 141A per order. PIV removed prior to discharge. Discharge summary and patient belongings provided to patient. Patient's wound vac therapy transitioned to portable system. Wound vac operating and functional. Patient and son Tresa Endo were educated on how to administer Lovenox injections. Both patient and son verbalized understanding. Patient transported via wheelchair to front entrance of hospital and left via POV with both sons.

## 2021-12-04 NOTE — Plan of Care (Signed)

## 2021-12-04 NOTE — Progress Notes (Signed)
° °  Subjective: 5 Days Post-Op Procedure(s) (LRB): TOTAL HIP ARTHROPLASTY ANTERIOR APPROACH (Right) Patient reports pain as mild Patient is well, and has had no acute complaints or problems Denies any CP, SOB, ABD pain. We will continue therapy today.  Plan is to go to home with home health PT   Objective: Vital signs in last 24 hours: Temp:  [98 F (36.7 C)-99.2 F (37.3 C)] 98 F (36.7 C) (01/08 0813) Pulse Rate:  [85-104] 85 (01/08 0813) Resp:  [18] 18 (01/08 0813) BP: (115-139)/(65-74) 131/66 (01/08 0813) SpO2:  [94 %-100 %] 94 % (01/08 0813)  Intake/Output from previous day: No intake/output data recorded. Intake/Output this shift: Total I/O In: -  Out: 300 [Urine:300]  Recent Labs    12/02/21 0721 12/03/21 0555 12/04/21 0313  HGB 9.2* 8.5* 8.5*   Recent Labs    12/03/21 0555 12/04/21 0313  WBC 10.0 9.9  RBC 2.69* 2.66*  HCT 25.9* 25.8*  PLT 185 222   No results for input(s): NA, K, CL, CO2, BUN, CREATININE, GLUCOSE, CALCIUM in the last 72 hours.  No results for input(s): LABPT, INR in the last 72 hours.   EXAM General - Patient is Alert, Appropriate, and Oriented Extremity - Neurovascular intact Sensation intact distally Intact pulses distally Dorsiflexion/Plantar flexion intact No cellulitis present Compartment soft Dressing - dressing C/D/I and no drainage, provena intact Motor Function - intact, moving foot and toes well on exam.   Past Medical History:  Diagnosis Date   Allergy    Anxiety    Cataract    Depression    GERD (gastroesophageal reflux disease)    Hyperlipidemia    Hypertension    Osteoporosis     Assessment/Plan:   5 Days Post-Op Procedure(s) (LRB): TOTAL HIP ARTHROPLASTY ANTERIOR APPROACH (Right) Principal Problem:   Closed right hip fracture (HCC) Active Problems:   COVID-19 virus infection  Estimated body mass index is 29.05 kg/m as calculated from the following:   Height as of this encounter: 5\' 3"  (1.6 m).    Weight as of this encounter: 74.4 kg. Advance diet Up with therapy, good progress with PT VSS Labs stable CM to assist with discharge to home with HHPT   Remove Praveena and apply honeycomb dressing 7 days following discharge from hospital. Lovenox 40 mg subcu daily x14 days at discharge TED hose bilateral lower extremity x6 weeks Follow-up with [facility] orthopedics in 2 weeks    DVT Prophylaxis - Lovenox, TED hose, and SCDS Weight-Bearing as tolerated to Right leg   [facility], PA-C [facility]opa[facility] 12/04/2021, 8:58 AM

## 2021-12-04 NOTE — Discharge Summary (Signed)
Physician Discharge Summary  Gabrielle Salinas ZOX:[id number] DOB: [birth year] DOA: 11/28/2021  PCP: Malva LimesFisher, Donald E, MD  Admit date: 11/28/2021 Discharge date: 12/04/2021  Admitted From: Home Disposition: Home with home health  Recommendations for Outpatient Follow-up:  Follow up with PCP in 1-2 weeks Please obtain BMP/CBC in one week Orthopedic office to schedule follow-up  Home Health: PT/OT Equipment/Devices: Dan HumphreysWalker, wound VAC  Discharge Condition: Stable CODE STATUS: Full code Diet recommendation: Regular diet, low-salt  Discharge summary: Patient with multiple medical issues including hypertension , anxiety depression admit to the hospital after accidental mechanical fall at home, she tripped over her shoes and fell on her right side.  Found to have right hip fracture as well positive for COVID-19 with minimal symptoms.  Close traumatic right hip fracture: Total right hip 1/3. Pain managed with oral pain medications including oxycodone, prescription sent. Orthopedics I have seen patient today and discharge recommendations remain Lovenox for DVT prophylaxis, 2 weeks from discharge Wound VAC to continue with no dressing changes.  Instructions to change to honeycomb dressing in 1 week after discharge. Weightbearing as tolerated. Going home with home health PT OT and family support.   COVID-19 virus infection: Has some congestion.  No pneumonia or hypoxemia.  Symptomatic treatment.  Cough medications.   Anxiety and depression: On Xanax and Prozac that she will continue.   Essential hypertension: Blood pressures better.  Will resume antihypertensives on discharge.   Stable for discharge.  Instructions, Lovenox injection techniques learned by the patient.  Adequate support system at home and able to go home.   Discharge Diagnoses:  Principal Problem:   Closed right hip fracture (HCC) Active Problems:   COVID-19 virus infection    Discharge Instructions  Discharge  Instructions     Call MD for:  redness, tenderness, or signs of infection (pain, swelling, redness, odor or green/yellow discharge around incision site)   Complete by: As directed    Call MD for:  severe uncontrolled pain   Complete by: As directed    Diet - low sodium heart healthy   Complete by: As directed    Increase activity slowly   Complete by: As directed    No wound care   Complete by: As directed    To keep wound vac as instructions      Allergies as of 12/04/2021       Reactions   Bactrim [sulfamethoxazole-trimethoprim] Itching   Bee Venom Swelling   Daypro  [oxaprozin]    Sulfa Antibiotics Diarrhea   Sulfasalazine         Medication List     TAKE these medications    acetaminophen 325 MG tablet Commonly known as: TYLENOL Take 650 mg by mouth every 4 (four) hours as needed for mild pain or fever.   alendronate 70 MG tablet Commonly known as: FOSAMAX TAKE 1 TABLET BY MOUTH EVERY WEEK WITH A FULL GLASS OF WATER AND ON AN EMPTY STOMACH What changed:  how much to take when to take this additional instructions   ALPRAZolam 0.25 MG tablet Commonly known as: XANAX TAKE 1 TO 2 TABLETS BY MOUTH EVERY 4 HOURS AS NEEDED FOR ANXIETY. DO NOT EXCEED 4 TABLETS DAILY   Calcium Carb-Cholecalciferol 600-500 MG-UNIT Caps Take 600 Units by mouth daily.   clorazepate 15 MG tablet Commonly known as: TRANXENE TAKE 1/2 TABLET BY MOUTH THREE TIMES DAILY AS NEEDED   enoxaparin 40 MG/0.4ML injection Commonly known as: LOVENOX Inject 0.4 mLs (40 mg total) into the skin  daily for 14 days.   esomeprazole 40 MG capsule Commonly known as: NEXIUM TAKE 1 CAPSULE BY MOUTH EVERY DAY AT NOON   ezetimibe 10 MG tablet Commonly known as: ZETIA TAKE 1 TABLET(10 MG) BY MOUTH DAILY   Fish Oil 1000 MG Caps Take 1 capsule by mouth daily.   FLUoxetine 20 MG capsule Commonly known as: PROZAC TAKE 1 CAPSULE(20 MG) BY MOUTH DAILY   hydrochlorothiazide 12.5 MG tablet Commonly known  as: HYDRODIURIL TAKE 1 TABLET(12.5 MG) BY MOUTH DAILY   HYDROcodone-acetaminophen 5-325 MG tablet Commonly known as: NORCO/VICODIN Take 1 tablet by mouth every 4 (four) hours as needed for moderate pain.   Joint Support Caps Take 1 capsule by mouth daily.   lisinopril 10 MG tablet Commonly known as: ZESTRIL TAKE 1 TABLET(10 MG) BY MOUTH DAILY   montelukast 10 MG tablet Commonly known as: SINGULAIR TAKE 1 TABLET BY MOUTH EVERY DAY FOR ALLERGIES   MULTIVITAMIN ADULT PO Take 1 tablet by mouth daily.   PRESERVISION AREDS 2 PO Take 1 tablet by mouth 2 (two) times daily.   phentermine 30 MG capsule TAKE 1 CAPSULE(30 MG) BY MOUTH EVERY MORNING   topiramate 50 MG tablet Commonly known as: TOPAMAX Take 1 tablet (50 mg total) by mouth 2 (two) times daily.   triamcinolone cream 0.5 % Commonly known as: KENALOG Apply 1 application topically daily as needed.   Turmeric 500 MG Caps Take 1 capsule by mouth daily.   vitamin C 500 MG tablet Commonly known as: ASCORBIC ACID Take 500 mg by mouth daily.   vitamin E 1000 UNIT capsule Take 1,000 Units by mouth daily.               Durable Medical Equipment  (From admission, onward)           Start     Ordered   12/01/21 1414  For home use only DME Walker rolling  Once       Question Answer Comment  Walker: With 5 Inch Wheels   Patient needs a walker to treat with the following condition Weakness      12/01/21 1414   11/29/21 1844  DME Walker rolling  Once       Question Answer Comment  Walker: With 5 Inch Wheels   Patient needs a walker to treat with the following condition Status post total hip replacement, right      11/29/21 1843   11/29/21 1844  DME 3 n 1  Once        11/29/21 1843   11/29/21 1844  DME Bedside commode  Once       Question:  Patient needs a bedside commode to treat with the following condition  Answer:  Status post total hip replacement, right   11/29/21 1843            Follow-up  Information     Evon Slack, PA-C Follow up in 2 week(s).   Specialties: Orthopedic Surgery, Emergency Medicine Contact information: [address] [place] [address] [phone]                Allergies  Allergen Reactions   Bactrim [Sulfamethoxazole-Trimethoprim] Itching   Bee Venom Swelling   Daypro  [Oxaprozin]    Sulfa Antibiotics Diarrhea   Sulfasalazine     Consultations: Orthopedics   Procedures/Studies: DG Chest 1 View  Result Date: 11/28/2021 CLINICAL DATA:  Status post fall EXAM: CHEST  1 VIEW COMPARISON:  None. FINDINGS: The heart  and mediastinal contours are unchanged. No focal consolidation. No pulmonary edema. No pleural effusion. No pneumothorax. No acute osseous abnormality. Dextroscoliosis of the mid to lower thoracic spine. IMPRESSION: No active disease. Electronically Signed   By: Tish Frederickson M.D.   On: 11/28/2021 23:30   CT HEAD WO CONTRAST ( )  Result Date: 12/01/2021 CLINICAL DATA:  Headache, frequent falls, recent head injury EXAM: CT HEAD WITHOUT CONTRAST TECHNIQUE: Contiguous axial images were obtained from the base of the skull through the vertex without intravenous contrast. COMPARISON:  None. FINDINGS: Brain: Scattered hypodensities are seen throughout the periventricular and subcortical white matter as well as the left basal ganglia, consistent with age-indeterminate small vessel ischemic changes. No other signs of acute infarct or hemorrhage. Lateral ventricles and remaining midline structures are unremarkable. No acute extra-axial collections. No mass effect. Vascular: No hyperdense vessel or unexpected calcification. Skull: Normal. Negative for fracture or focal lesion. Sinuses/Orbits: Near complete opacification of the bilateral maxillary sinuses and anterior ethmoid air cells. Other: None. IMPRESSION: 1. Age-indeterminate small-vessel ischemic changes throughout the periventricular white matter and left basal ganglia, favor  chronic. 2. No acute hemorrhage. Electronically Signed   By: Sharlet Salina M.D.   On: 12/01/2021 22:43   DG C-Arm 1-60 Min-No Report  Result Date: 11/29/2021 Fluoroscopy was utilized by the requesting physician.  No radiographic interpretation.   DG C-Arm 1-60 Min-No Report  Result Date: 11/29/2021 Fluoroscopy was utilized by the requesting physician.  No radiographic interpretation.   DG HIP UNILAT WITH PELVIS 1V RIGHT  Result Date: 11/29/2021 CLINICAL DATA:  Right hip fracture. EXAM: DG HIP (WITH OR WITHOUT PELVIS) 1V RIGHT COMPARISON:  11/28/2020 FINDINGS: Right hip replacement in satisfactory position and alignment. No fracture or complication IMPRESSION: Right hip replacement for right femoral neck fracture. Electronically Signed   By: Marlan Palau M.D.   On: 11/29/2021 17:16   DG HIP UNILAT W OR W/O PELVIS 2-3 VIEWS RIGHT  Result Date: 11/29/2021 CLINICAL DATA:  Postoperative pain. EXAM: DG HIP (WITH OR WITHOUT PELVIS) 2-3V RIGHT COMPARISON:  Fluoroscopic images of same day. FINDINGS: Status post right hip arthroplasty. Expected postoperative changes are seen in the surrounding soft tissues. No fracture or dislocation is noted. IMPRESSION: Status post right hip arthroplasty. Electronically Signed   By: Lupita Raider M.D.   On: 11/29/2021 19:21   DG Hip Unilat  With Pelvis 2-3 Views Right  Result Date: 11/28/2021 CLINICAL DATA:  Right hip pain. EXAM: DG HIP (WITH OR WITHOUT PELVIS) 2-3V RIGHT COMPARISON:  None. FINDINGS: Marked severity foreshortening of the right femoral neck is seen. There is no evidence of dislocation. Sclerotic changes are seen involving the symphysis pubis. IMPRESSION: Acute fracture of the right femoral neck. Electronically Signed   By: Aram Candela M.D.   On: 11/28/2021 23:32   (Echo, Carotid, EGD, Colonoscopy, ERCP)    Subjective: Patient seen and examined.  Mild to moderate pain remains on the right thigh.  She is complaining of some swelling around the  right thigh.  Going to son's home.  Called and discussed with orthopedic service about discharge planning.  They clarified that patient will be wearing portable wound VAC with same dressing for next 1 week. Patient is comfortable injecting Lovenox herself.   Discharge Exam: Vitals:   12/04/21 0440 12/04/21 0813  BP: 134/74 131/66  Pulse: 94 85  Resp: 18 18  Temp: 98.5 F (36.9 C) 98 F (36.7 C)  SpO2: 97% 94%   Vitals:   12/03/21 1500  12/03/21 2154 12/04/21 0440 12/04/21 0813  BP: 136/74 139/66 134/74 131/66  Pulse: 98 (!) 104 94 85  Resp: 18 18 18 18   Temp: 98.6 F (37 C) 99.2 F (37.3 C) 98.5 F (36.9 C) 98 F (36.7 C)  TempSrc:   Oral Oral  SpO2: 96% 100% 97% 94%  Weight:      Height:        General: Pt is alert, awake, not in acute distress Cardiovascular: RRR, S1/S2 +, no rubs, no gallops Respiratory: CTA bilaterally, no wheezing, no rhonchi Abdominal: Soft, NT, ND, bowel sounds + Extremities:  Patient does have some edema around the incision line with wound VAC in place. Mild tenderness.  No erythema or fluctuant swelling.    The results of significant diagnostics from this hospitalization (including imaging, microbiology, ancillary and laboratory) are listed below for reference.     Microbiology: Recent Results (from the past 240 hour(s))  Resp Panel by RT-PCR (Flu A&B, Covid) Nasopharyngeal Swab     Status: Abnormal   Collection Time: 11/29/21 12:32 AM   Specimen: Nasopharyngeal Swab; Nasopharyngeal(NP) swabs in vial transport medium  Result Value Ref Range Status   SARS Coronavirus 2 by RT PCR POSITIVE (A) NEGATIVE Final    Comment: (NOTE) SARS-CoV-2 target nucleic acids are DETECTED.  The SARS-CoV-2 RNA is generally detectable in upper respiratory specimens during the acute phase of infection. Positive results are indicative of the presence of the identified virus, but do not rule out bacterial infection or co-infection with other pathogens  not detected by the test. Clinical correlation with patient history and other diagnostic information is necessary to determine patient infection status. The expected result is Negative.  Fact Sheet for Patients: BloggerCourse.com  Fact Sheet for Healthcare Providers: SeriousBroker.it  This test is not yet approved or cleared by the [place] FDA and  has been authorized for detection and/or diagnosis of SARS-CoV-2 by FDA under an Emergency Use Authorization (EUA).  This EUA will remain in effect (meaning this test can be used) for the duration of  the COVID-19 declaration under Section 564(b)(1) of the A ct, 21 U.S.C. section 360bbb-3(b)(1), unless the authorization is terminated or revoked sooner.     Influenza A by PCR NEGATIVE NEGATIVE Final   Influenza B by PCR NEGATIVE NEGATIVE Final    Comment: (NOTE) The Xpert Xpress SARS-CoV-2/FLU/RSV plus assay is intended as an aid in the diagnosis of influenza from Nasopharyngeal swab specimens and should not be used as a sole basis for treatment. Nasal washings and aspirates are unacceptable for Xpert Xpress SARS-CoV-2/FLU/RSV testing.  Fact Sheet for Patients: BloggerCourse.com  Fact Sheet for Healthcare Providers: SeriousBroker.it  This test is not yet approved or cleared by the [place] FDA and has been authorized for detection and/or diagnosis of SARS-CoV-2 by FDA under an Emergency Use Authorization (EUA). This EUA will remain in effect (meaning this test can be used) for the duration of the COVID-19 declaration under Section 564(b)(1) of the Act, 21 U.S.C. section 360bbb-3(b)(1), unless the authorization is terminated or revoked.  Performed at [facility], [address] [place], [place] [address]   Surgical pcr screen     Status: None   Collection Time: 11/29/21  9:07 AM   Specimen: Nasal Mucosa;  Nasal Swab  Result Value Ref Range Status   MRSA, PCR NEGATIVE NEGATIVE Final   Staphylococcus aureus NEGATIVE NEGATIVE Final    Comment: (NOTE) The Xpert SA Assay (FDA approved for NASAL specimens in patients 22  years of age and older), is one component of a comprehensive surveillance program. It is not intended to diagnose infection nor to guide or monitor treatment. Performed at [facility], [address] [place], [place] [address]      Labs: BNP (last 3 results) No results for input(s): BNP in the last 8760 hours. Basic Metabolic Panel: Recent Labs  Lab 11/29/21 0032 11/30/21 0424 12/01/21 0429  NA 129* 130* 130*  K 3.5 3.0* 3.9  CL 96* 93* 93*  CO2 24 26 29   GLUCOSE 139* 167* 165*  BUN 13 16 16   CREATININE 0.72 0.75 0.60  CALCIUM 8.3* 7.8* 8.0*  MG  --   --  2.1   Liver Function Tests: Recent Labs  Lab 11/30/21 0424  AST 31  ALT 28  ALKPHOS 92  BILITOT 0.5  PROT 5.8*  ALBUMIN 2.8*   No results for input(s): LIPASE, AMYLASE in the last 168 hours. No results for input(s): AMMONIA in the last 168 hours. CBC: Recent Labs  Lab 11/30/21 0424 12/01/21 0429 12/02/21 0721 12/03/21 0555 12/04/21 0313  WBC 8.5 11.4* 11.1* 10.0 9.9  NEUTROABS 5.9 7.7 7.7 6.6 6.5  HGB 10.6* 10.1* 9.2* 8.5* 8.5*  HCT 31.7* 29.0* 27.5* 25.9* 25.8*  MCV 94.9 94.8 96.5 96.3 97.0  PLT 170 152 170 185 222   Cardiac Enzymes: No results for input(s): CKTOTAL, CKMB, CKMBINDEX, TROPONINI in the last 168 hours. BNP: Invalid input(s): POCBNP CBG: No results for input(s): GLUCAP in the last 168 hours. D-Dimer No results for input(s): DDIMER in the last 72 hours. Hgb A1c No results for input(s): HGBA1C in the last 72 hours. Lipid Profile No results for input(s): CHOL, HDL, LDLCALC, TRIG, CHOLHDL, LDLDIRECT in the last 72 hours. Thyroid function studies No results for input(s): TSH, T4TOTAL, T3FREE, THYROIDAB in the last 72 hours.  Invalid input(s): FREET3 Anemia  work up No results for input(s): VITAMINB12, FOLATE, FERRITIN, TIBC, IRON, RETICCTPCT in the last 72 hours. Urinalysis    Component Value Date/Time   BILIRUBINUR negative 10/17/2016 1042   PROTEINUR negative 10/17/2016 1042   UROBILINOGEN 0.2 10/17/2016 1042   NITRITE negative 10/17/2016 1042   LEUKOCYTESUR moderate (2+) (A) 10/17/2016 1042   Sepsis Labs Invalid input(s): PROCALCITONIN,  WBC,  LACTICIDVEN Microbiology Recent Results (from the past 240 hour(s))  Resp Panel by RT-PCR (Flu A&B, Covid) Nasopharyngeal Swab     Status: Abnormal   Collection Time: 11/29/21 12:32 AM   Specimen: Nasopharyngeal Swab; Nasopharyngeal(NP) swabs in vial transport medium  Result Value Ref Range Status   SARS Coronavirus 2 by RT PCR POSITIVE (A) NEGATIVE Final    Comment: (NOTE) SARS-CoV-2 target nucleic acids are DETECTED.  The SARS-CoV-2 RNA is generally detectable in upper respiratory specimens during the acute phase of infection. Positive results are indicative of the presence of the identified virus, but do not rule out bacterial infection or co-infection with other pathogens not detected by the test. Clinical correlation with patient history and other diagnostic information is necessary to determine patient infection status. The expected result is Negative.  Fact Sheet for Patients: 10/19/2016  Fact Sheet for Healthcare Providers: 01/27/22  This test is not yet approved or cleared by the BloggerCourse.com FDA and  has been authorized for detection and/or diagnosis of SARS-CoV-2 by FDA under an Emergency Use Authorization (EUA).  This EUA will remain in effect (meaning this test can be used) for the duration of  the COVID-19 declaration under Section 564(b)(1) of the A  ct, 21 U.S.C. section 360bbb-3(b)(1), unless the authorization is terminated or revoked sooner.     Influenza A by PCR NEGATIVE NEGATIVE Final   Influenza  B by PCR NEGATIVE NEGATIVE Final    Comment: (NOTE) The Xpert Xpress SARS-CoV-2/FLU/RSV plus assay is intended as an aid in the diagnosis of influenza from Nasopharyngeal swab specimens and should not be used as a sole basis for treatment. Nasal washings and aspirates are unacceptable for Xpert Xpress SARS-CoV-2/FLU/RSV testing.  Fact Sheet for Patients: BloggerCourse.comhttps://www.fda.gov/media/152166/download  Fact Sheet for Healthcare Providers: SeriousBroker.ithttps://www.fda.gov/media/152162/download  This test is not yet approved or cleared by the [place] FDA and has been authorized for detection and/or diagnosis of SARS-CoV-2 by FDA under an Emergency Use Authorization (EUA). This EUA will remain in effect (meaning this test can be used) for the duration of the COVID-19 declaration under Section 564(b)(1) of the Act, 21 U.S.C. section 360bbb-3(b)(1), unless the authorization is terminated or revoked.  Performed at [facility], [address] [place], [place] [address]   Surgical pcr screen     Status: None   Collection Time: 11/29/21  9:07 AM   Specimen: Nasal Mucosa; Nasal Swab  Result Value Ref Range Status   MRSA, PCR NEGATIVE NEGATIVE Final   Staphylococcus aureus NEGATIVE NEGATIVE Final    Comment: (NOTE) The Xpert SA Assay (FDA approved for NASAL specimens in patients 73 years of age and older), is one component of a comprehensive surveillance program. It is not intended to diagnose infection nor to guide or monitor treatment. Performed at [facility], [address] [place], [place] [address]      Time coordinating discharge: 40 minutes  SIGNED:   Dorcas CarrowKuber Andy Moye, MD  [facility] 12/04/2021, 11:25 AM

## 2021-12-05 ENCOUNTER — Telehealth: Payer: Self-pay | Admitting: Family Medicine

## 2021-12-05 NOTE — Telephone Encounter (Signed)
That's fine

## 2021-12-05 NOTE — Telephone Encounter (Signed)
Kelsey advised.   Thanks,   -Marquist Binstock  

## 2021-12-05 NOTE — Telephone Encounter (Signed)
Copied from CRM [id number]. Topic: Quick Communication - Home Health Verbal Orders >> Dec 05, 2021  1:11 PM Jaquita Rector A wrote: Caller/Agency: [facility]  Callback Number: [phone] Requesting OT/PT/Skilled Nursing/Social Work/Speech Therapy: Home health  Frequency: none yet

## 2021-12-06 ENCOUNTER — Other Ambulatory Visit: Payer: Medicare Other

## 2021-12-07 ENCOUNTER — Telehealth: Payer: Self-pay | Admitting: Family Medicine

## 2021-12-07 DIAGNOSIS — S72001D Fracture of unspecified part of neck of right femur, subsequent encounter for closed fracture with routine healing: Secondary | ICD-10-CM | POA: Diagnosis not present

## 2021-12-07 DIAGNOSIS — E785 Hyperlipidemia, unspecified: Secondary | ICD-10-CM | POA: Diagnosis not present

## 2021-12-07 DIAGNOSIS — K219 Gastro-esophageal reflux disease without esophagitis: Secondary | ICD-10-CM | POA: Diagnosis not present

## 2021-12-07 DIAGNOSIS — F32A Depression, unspecified: Secondary | ICD-10-CM | POA: Diagnosis not present

## 2021-12-07 DIAGNOSIS — I10 Essential (primary) hypertension: Secondary | ICD-10-CM | POA: Diagnosis not present

## 2021-12-07 DIAGNOSIS — Z9842 Cataract extraction status, left eye: Secondary | ICD-10-CM | POA: Diagnosis not present

## 2021-12-07 DIAGNOSIS — M810 Age-related osteoporosis without current pathological fracture: Secondary | ICD-10-CM | POA: Diagnosis not present

## 2021-12-07 DIAGNOSIS — Z9181 History of falling: Secondary | ICD-10-CM | POA: Diagnosis not present

## 2021-12-07 DIAGNOSIS — Z9841 Cataract extraction status, right eye: Secondary | ICD-10-CM | POA: Diagnosis not present

## 2021-12-07 DIAGNOSIS — Z7901 Long term (current) use of anticoagulants: Secondary | ICD-10-CM | POA: Diagnosis not present

## 2021-12-07 DIAGNOSIS — H269 Unspecified cataract: Secondary | ICD-10-CM | POA: Diagnosis not present

## 2021-12-07 NOTE — Telephone Encounter (Signed)
Home Health Verbal Orders - Caller/Agency: Romilda Garret from [facility] Callback Number: [phone] Requesting OT/PT/Skilled Nursing/Social Work/Speech Therapy: PT  Frequency:  1w1 2w3 1w1

## 2021-12-07 NOTE — Telephone Encounter (Signed)
Advised 

## 2021-12-07 NOTE — Telephone Encounter (Signed)
That's fine

## 2021-12-12 DIAGNOSIS — Z9841 Cataract extraction status, right eye: Secondary | ICD-10-CM | POA: Diagnosis not present

## 2021-12-12 DIAGNOSIS — E785 Hyperlipidemia, unspecified: Secondary | ICD-10-CM | POA: Diagnosis not present

## 2021-12-12 DIAGNOSIS — M810 Age-related osteoporosis without current pathological fracture: Secondary | ICD-10-CM | POA: Diagnosis not present

## 2021-12-12 DIAGNOSIS — Z9842 Cataract extraction status, left eye: Secondary | ICD-10-CM | POA: Diagnosis not present

## 2021-12-12 DIAGNOSIS — K219 Gastro-esophageal reflux disease without esophagitis: Secondary | ICD-10-CM | POA: Diagnosis not present

## 2021-12-12 DIAGNOSIS — F32A Depression, unspecified: Secondary | ICD-10-CM | POA: Diagnosis not present

## 2021-12-12 DIAGNOSIS — Z7901 Long term (current) use of anticoagulants: Secondary | ICD-10-CM | POA: Diagnosis not present

## 2021-12-12 DIAGNOSIS — H269 Unspecified cataract: Secondary | ICD-10-CM | POA: Diagnosis not present

## 2021-12-12 DIAGNOSIS — Z9181 History of falling: Secondary | ICD-10-CM | POA: Diagnosis not present

## 2021-12-12 DIAGNOSIS — S72001D Fracture of unspecified part of neck of right femur, subsequent encounter for closed fracture with routine healing: Secondary | ICD-10-CM | POA: Diagnosis not present

## 2021-12-12 DIAGNOSIS — I10 Essential (primary) hypertension: Secondary | ICD-10-CM | POA: Diagnosis not present

## 2021-12-13 DIAGNOSIS — I10 Essential (primary) hypertension: Secondary | ICD-10-CM | POA: Diagnosis not present

## 2021-12-13 DIAGNOSIS — F419 Anxiety disorder, unspecified: Secondary | ICD-10-CM

## 2021-12-13 DIAGNOSIS — F32A Depression, unspecified: Secondary | ICD-10-CM | POA: Diagnosis not present

## 2021-12-13 DIAGNOSIS — M810 Age-related osteoporosis without current pathological fracture: Secondary | ICD-10-CM | POA: Diagnosis not present

## 2021-12-13 DIAGNOSIS — E785 Hyperlipidemia, unspecified: Secondary | ICD-10-CM | POA: Diagnosis not present

## 2021-12-13 DIAGNOSIS — Z9841 Cataract extraction status, right eye: Secondary | ICD-10-CM | POA: Diagnosis not present

## 2021-12-13 DIAGNOSIS — Z7901 Long term (current) use of anticoagulants: Secondary | ICD-10-CM | POA: Diagnosis not present

## 2021-12-13 DIAGNOSIS — S72001D Fracture of unspecified part of neck of right femur, subsequent encounter for closed fracture with routine healing: Secondary | ICD-10-CM | POA: Diagnosis not present

## 2021-12-13 DIAGNOSIS — H269 Unspecified cataract: Secondary | ICD-10-CM | POA: Diagnosis not present

## 2021-12-13 DIAGNOSIS — K219 Gastro-esophageal reflux disease without esophagitis: Secondary | ICD-10-CM | POA: Diagnosis not present

## 2021-12-14 DIAGNOSIS — H269 Unspecified cataract: Secondary | ICD-10-CM | POA: Diagnosis not present

## 2021-12-14 DIAGNOSIS — I10 Essential (primary) hypertension: Secondary | ICD-10-CM | POA: Diagnosis not present

## 2021-12-14 DIAGNOSIS — K219 Gastro-esophageal reflux disease without esophagitis: Secondary | ICD-10-CM | POA: Diagnosis not present

## 2021-12-14 DIAGNOSIS — Z9181 History of falling: Secondary | ICD-10-CM | POA: Diagnosis not present

## 2021-12-14 DIAGNOSIS — S72001D Fracture of unspecified part of neck of right femur, subsequent encounter for closed fracture with routine healing: Secondary | ICD-10-CM | POA: Diagnosis not present

## 2021-12-14 DIAGNOSIS — E785 Hyperlipidemia, unspecified: Secondary | ICD-10-CM | POA: Diagnosis not present

## 2021-12-14 DIAGNOSIS — M810 Age-related osteoporosis without current pathological fracture: Secondary | ICD-10-CM | POA: Diagnosis not present

## 2021-12-14 DIAGNOSIS — F32A Depression, unspecified: Secondary | ICD-10-CM | POA: Diagnosis not present

## 2021-12-14 DIAGNOSIS — Z9841 Cataract extraction status, right eye: Secondary | ICD-10-CM | POA: Diagnosis not present

## 2021-12-14 DIAGNOSIS — Z7901 Long term (current) use of anticoagulants: Secondary | ICD-10-CM | POA: Diagnosis not present

## 2021-12-14 DIAGNOSIS — Z9842 Cataract extraction status, left eye: Secondary | ICD-10-CM | POA: Diagnosis not present

## 2021-12-15 ENCOUNTER — Other Ambulatory Visit: Payer: Self-pay | Admitting: Family Medicine

## 2021-12-15 NOTE — Telephone Encounter (Signed)
Copied from [place] [id number]. Topic: Quick Communication - Rx Refill/Question >> Dec 15, 2021  9:26 AM Leward Quan A wrote: Medication: ALPRAZolam Duanne Moron) 0.25 MG tablet  Has the patient contacted their pharmacy? Yes.  Told to call office  (Agent: If no, request that the patient contact the pharmacy for the refill. If patient does not wish to contact the pharmacy document the reason why and proceed with request.) (Agent: If yes, when and what did the pharmacy advise?)  Preferred Pharmacy (with phone number or street name): [facility] [id number], [place]  Phone:  [phone] Fax:  [phone]    Has the patient been seen for an appointment in the last year OR does the patient have an upcoming appointment? Yes.    Agent: Please be advised that RX refills may take up to 3 business days. We ask that you follow-up with your pharmacy.

## 2021-12-15 NOTE — Telephone Encounter (Signed)
Requested medication (s) are due for refill today: yes  Requested medication (s) are on the active medication list: yes    Last refill: 10/12/21  #30  2 refills  Future visit scheduled yes 04/28/22  Notes to clinic: Not delegated  Requested Prescriptions  Pending Prescriptions Disp Refills   ALPRAZolam (XANAX) 0.25 MG tablet 30 tablet 2     Not Delegated - Psychiatry:  Anxiolytics/Hypnotics Failed - 12/15/2021 10:57 AM      Failed - This refill cannot be delegated      Failed - Urine Drug Screen completed in last 360 days      Passed - Valid encounter within last 6 months    Recent Outpatient Visits           1 month ago Viral URI with cough   [facility], Erin E, PA-C   3 months ago Obesity (BMI 30-39.9)   [facility] Malva Limes, MD   7 months ago Annual physical exam   [facility] Malva Limes, MD   1 year ago Overweight   [facility] Malva Limes, MD   1 year ago Prediabetes   [facility] Sherrie Mustache, Demetrios Isaacs, MD

## 2021-12-16 MED ORDER — ALPRAZOLAM 0.25 MG PO TABS
ORAL_TABLET | ORAL | 3 refills | Status: DC
Start: 2021-12-16 — End: 2022-03-03

## 2021-12-19 ENCOUNTER — Other Ambulatory Visit: Payer: Self-pay | Admitting: Family Medicine

## 2021-12-19 DIAGNOSIS — E785 Hyperlipidemia, unspecified: Secondary | ICD-10-CM | POA: Diagnosis not present

## 2021-12-19 DIAGNOSIS — Z9181 History of falling: Secondary | ICD-10-CM | POA: Diagnosis not present

## 2021-12-19 DIAGNOSIS — F32A Depression, unspecified: Secondary | ICD-10-CM | POA: Diagnosis not present

## 2021-12-19 DIAGNOSIS — Z7901 Long term (current) use of anticoagulants: Secondary | ICD-10-CM | POA: Diagnosis not present

## 2021-12-19 DIAGNOSIS — K219 Gastro-esophageal reflux disease without esophagitis: Secondary | ICD-10-CM | POA: Diagnosis not present

## 2021-12-19 DIAGNOSIS — Z9841 Cataract extraction status, right eye: Secondary | ICD-10-CM | POA: Diagnosis not present

## 2021-12-19 DIAGNOSIS — I10 Essential (primary) hypertension: Secondary | ICD-10-CM | POA: Diagnosis not present

## 2021-12-19 DIAGNOSIS — S72001D Fracture of unspecified part of neck of right femur, subsequent encounter for closed fracture with routine healing: Secondary | ICD-10-CM | POA: Diagnosis not present

## 2021-12-19 DIAGNOSIS — M810 Age-related osteoporosis without current pathological fracture: Secondary | ICD-10-CM | POA: Diagnosis not present

## 2021-12-19 DIAGNOSIS — H269 Unspecified cataract: Secondary | ICD-10-CM | POA: Diagnosis not present

## 2021-12-19 DIAGNOSIS — Z9842 Cataract extraction status, left eye: Secondary | ICD-10-CM | POA: Diagnosis not present

## 2021-12-20 ENCOUNTER — Telehealth: Payer: Self-pay | Admitting: Family Medicine

## 2021-12-20 MED ORDER — ALENDRONATE SODIUM 70 MG PO TABS: ORAL_TABLET | ORAL | 1 refills | Status: DC

## 2021-12-20 NOTE — Telephone Encounter (Signed)
[facility] faxed refill request for the following medications:  alendronate (FOSAMAX) 70 MG tablet   Please advise.  

## 2021-12-21 DIAGNOSIS — F32A Depression, unspecified: Secondary | ICD-10-CM | POA: Diagnosis not present

## 2021-12-21 DIAGNOSIS — Z9842 Cataract extraction status, left eye: Secondary | ICD-10-CM | POA: Diagnosis not present

## 2021-12-21 DIAGNOSIS — M810 Age-related osteoporosis without current pathological fracture: Secondary | ICD-10-CM | POA: Diagnosis not present

## 2021-12-21 DIAGNOSIS — Z9181 History of falling: Secondary | ICD-10-CM | POA: Diagnosis not present

## 2021-12-21 DIAGNOSIS — H269 Unspecified cataract: Secondary | ICD-10-CM | POA: Diagnosis not present

## 2021-12-21 DIAGNOSIS — K219 Gastro-esophageal reflux disease without esophagitis: Secondary | ICD-10-CM | POA: Diagnosis not present

## 2021-12-21 DIAGNOSIS — I10 Essential (primary) hypertension: Secondary | ICD-10-CM | POA: Diagnosis not present

## 2021-12-21 DIAGNOSIS — Z7901 Long term (current) use of anticoagulants: Secondary | ICD-10-CM | POA: Diagnosis not present

## 2021-12-21 DIAGNOSIS — Z9841 Cataract extraction status, right eye: Secondary | ICD-10-CM | POA: Diagnosis not present

## 2021-12-21 DIAGNOSIS — E785 Hyperlipidemia, unspecified: Secondary | ICD-10-CM | POA: Diagnosis not present

## 2021-12-21 DIAGNOSIS — S72001D Fracture of unspecified part of neck of right femur, subsequent encounter for closed fracture with routine healing: Secondary | ICD-10-CM | POA: Diagnosis not present

## 2021-12-28 DIAGNOSIS — K219 Gastro-esophageal reflux disease without esophagitis: Secondary | ICD-10-CM | POA: Diagnosis not present

## 2021-12-28 DIAGNOSIS — E785 Hyperlipidemia, unspecified: Secondary | ICD-10-CM | POA: Diagnosis not present

## 2021-12-28 DIAGNOSIS — Z9181 History of falling: Secondary | ICD-10-CM | POA: Diagnosis not present

## 2021-12-28 DIAGNOSIS — M810 Age-related osteoporosis without current pathological fracture: Secondary | ICD-10-CM | POA: Diagnosis not present

## 2021-12-28 DIAGNOSIS — I10 Essential (primary) hypertension: Secondary | ICD-10-CM | POA: Diagnosis not present

## 2021-12-28 DIAGNOSIS — Z7901 Long term (current) use of anticoagulants: Secondary | ICD-10-CM | POA: Diagnosis not present

## 2021-12-28 DIAGNOSIS — Z9841 Cataract extraction status, right eye: Secondary | ICD-10-CM | POA: Diagnosis not present

## 2021-12-28 DIAGNOSIS — Z9842 Cataract extraction status, left eye: Secondary | ICD-10-CM | POA: Diagnosis not present

## 2021-12-28 DIAGNOSIS — H269 Unspecified cataract: Secondary | ICD-10-CM | POA: Diagnosis not present

## 2021-12-28 DIAGNOSIS — F32A Depression, unspecified: Secondary | ICD-10-CM | POA: Diagnosis not present

## 2021-12-28 DIAGNOSIS — S72001D Fracture of unspecified part of neck of right femur, subsequent encounter for closed fracture with routine healing: Secondary | ICD-10-CM | POA: Diagnosis not present

## 2021-12-30 DIAGNOSIS — S72001D Fracture of unspecified part of neck of right femur, subsequent encounter for closed fracture with routine healing: Secondary | ICD-10-CM | POA: Diagnosis not present

## 2021-12-30 DIAGNOSIS — E785 Hyperlipidemia, unspecified: Secondary | ICD-10-CM | POA: Diagnosis not present

## 2021-12-30 DIAGNOSIS — F32A Depression, unspecified: Secondary | ICD-10-CM | POA: Diagnosis not present

## 2021-12-30 DIAGNOSIS — Z7901 Long term (current) use of anticoagulants: Secondary | ICD-10-CM | POA: Diagnosis not present

## 2021-12-30 DIAGNOSIS — Z9841 Cataract extraction status, right eye: Secondary | ICD-10-CM | POA: Diagnosis not present

## 2021-12-30 DIAGNOSIS — I10 Essential (primary) hypertension: Secondary | ICD-10-CM | POA: Diagnosis not present

## 2021-12-30 DIAGNOSIS — H269 Unspecified cataract: Secondary | ICD-10-CM | POA: Diagnosis not present

## 2021-12-30 DIAGNOSIS — Z9842 Cataract extraction status, left eye: Secondary | ICD-10-CM | POA: Diagnosis not present

## 2021-12-30 DIAGNOSIS — K219 Gastro-esophageal reflux disease without esophagitis: Secondary | ICD-10-CM | POA: Diagnosis not present

## 2021-12-30 DIAGNOSIS — M810 Age-related osteoporosis without current pathological fracture: Secondary | ICD-10-CM | POA: Diagnosis not present

## 2021-12-30 DIAGNOSIS — Z9181 History of falling: Secondary | ICD-10-CM | POA: Diagnosis not present

## 2022-01-02 DIAGNOSIS — K219 Gastro-esophageal reflux disease without esophagitis: Secondary | ICD-10-CM | POA: Diagnosis not present

## 2022-01-02 DIAGNOSIS — Z9841 Cataract extraction status, right eye: Secondary | ICD-10-CM | POA: Diagnosis not present

## 2022-01-02 DIAGNOSIS — E785 Hyperlipidemia, unspecified: Secondary | ICD-10-CM | POA: Diagnosis not present

## 2022-01-02 DIAGNOSIS — Z9842 Cataract extraction status, left eye: Secondary | ICD-10-CM | POA: Diagnosis not present

## 2022-01-02 DIAGNOSIS — I10 Essential (primary) hypertension: Secondary | ICD-10-CM | POA: Diagnosis not present

## 2022-01-02 DIAGNOSIS — S72001D Fracture of unspecified part of neck of right femur, subsequent encounter for closed fracture with routine healing: Secondary | ICD-10-CM | POA: Diagnosis not present

## 2022-01-02 DIAGNOSIS — M810 Age-related osteoporosis without current pathological fracture: Secondary | ICD-10-CM | POA: Diagnosis not present

## 2022-01-02 DIAGNOSIS — F32A Depression, unspecified: Secondary | ICD-10-CM | POA: Diagnosis not present

## 2022-01-02 DIAGNOSIS — H269 Unspecified cataract: Secondary | ICD-10-CM | POA: Diagnosis not present

## 2022-01-02 DIAGNOSIS — Z9181 History of falling: Secondary | ICD-10-CM | POA: Diagnosis not present

## 2022-01-02 DIAGNOSIS — Z7901 Long term (current) use of anticoagulants: Secondary | ICD-10-CM | POA: Diagnosis not present

## 2022-01-11 ENCOUNTER — Other Ambulatory Visit: Payer: Self-pay | Admitting: Family Medicine

## 2022-01-11 DIAGNOSIS — M1611 Unilateral primary osteoarthritis, right hip: Secondary | ICD-10-CM | POA: Diagnosis not present

## 2022-01-11 DIAGNOSIS — F411 Generalized anxiety disorder: Secondary | ICD-10-CM

## 2022-01-11 DIAGNOSIS — S72041D Displaced fracture of base of neck of right femur, subsequent encounter for closed fracture with routine healing: Secondary | ICD-10-CM | POA: Diagnosis not present

## 2022-01-11 DIAGNOSIS — Z96641 Presence of right artificial hip joint: Secondary | ICD-10-CM | POA: Diagnosis not present

## 2022-01-20 ENCOUNTER — Other Ambulatory Visit: Payer: Self-pay | Admitting: Family Medicine

## 2022-02-24 IMAGING — CT CT HEAD W/O CM
4 series · 16 of 47 positions shown, 18 images · non-contrast
Comparison: None.

CLINICAL DATA: Headache, frequent falls, recent head injury

EXAM:
CT HEAD WITHOUT CONTRAST
TECHNIQUE: Contiguous axial images were obtained from the base of the skull
through the vertex without intravenous contrast.

[Series 2: head wo · axial · 0.40mm/px · z∈[+632,+747]mm · 7 of 31 slices shown, 9 images]
[im 4/31  brain]
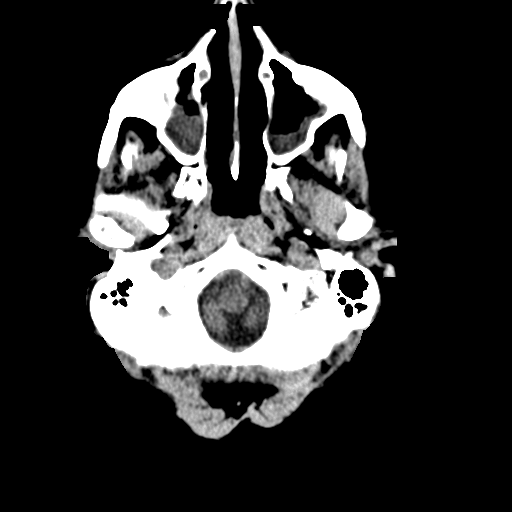
[im 4/31  bone]
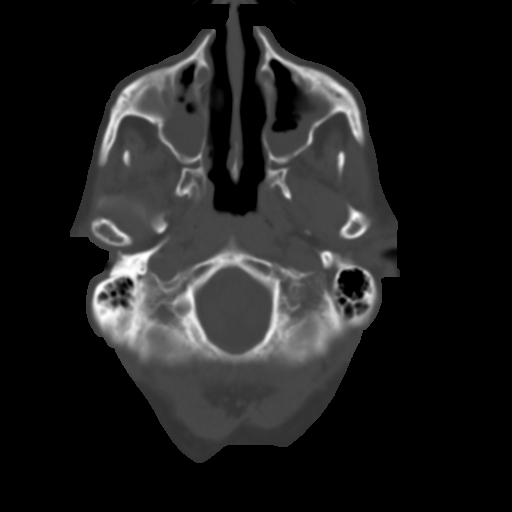
[im 8/31  brain]
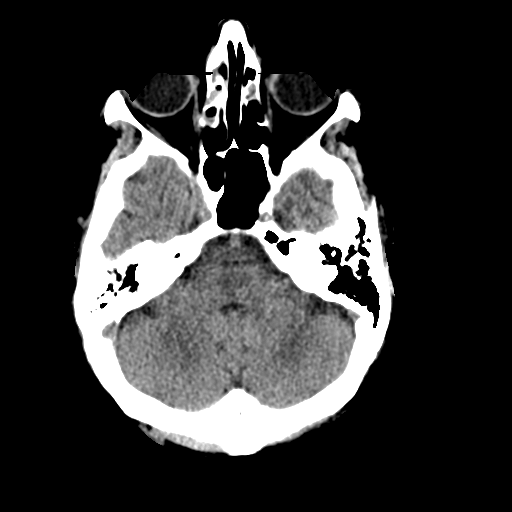
[im 12/31  brain]
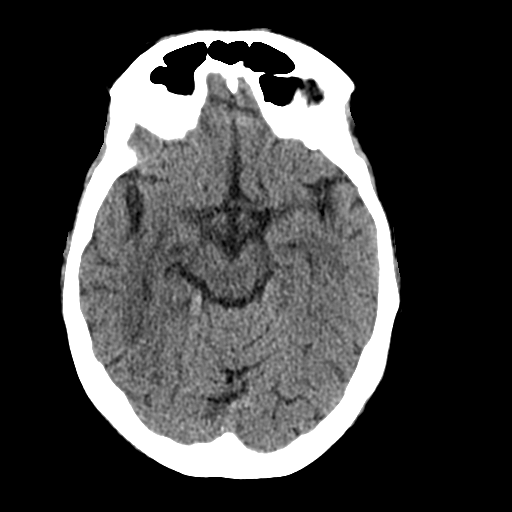
[im 16/31  brain]
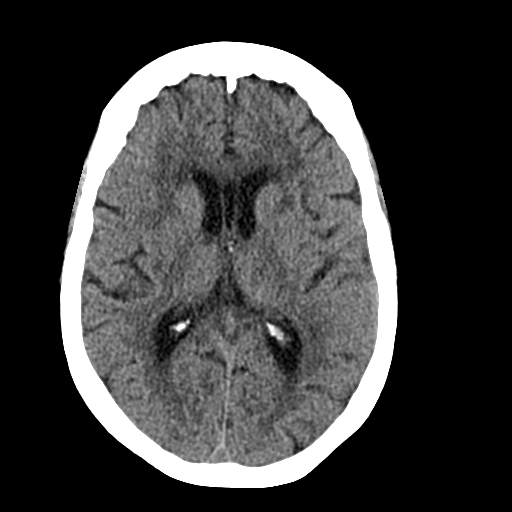
[im 19/31  brain]
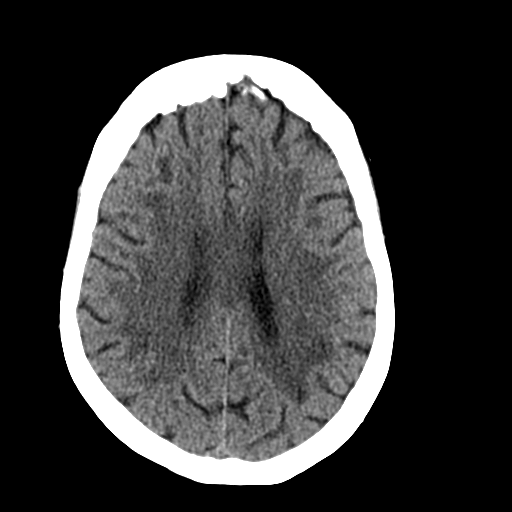
[im 19/31  bone]
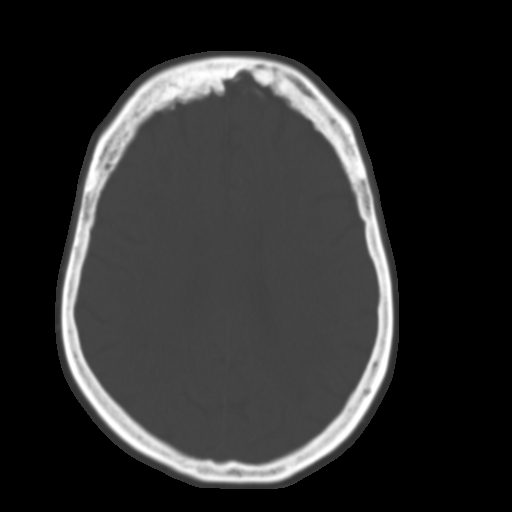
[im 23/31  brain]
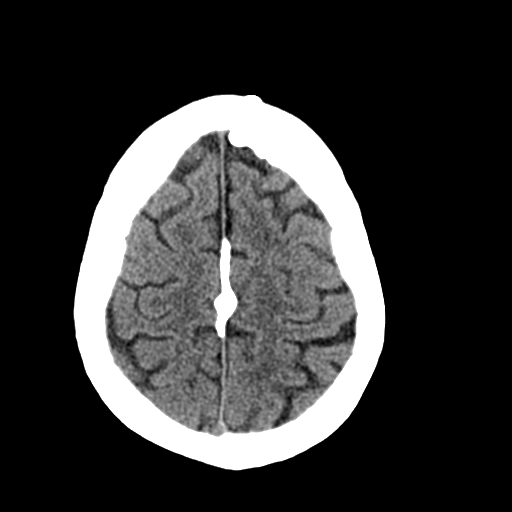
[im 27/31  brain]
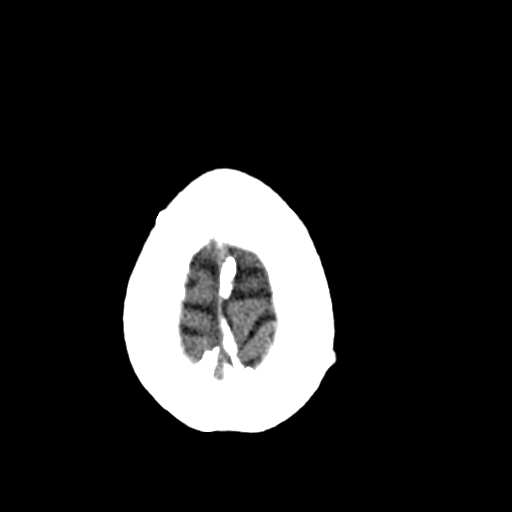

[Series 3: head bone · axial · 0.40mm/px · z∈[+631,+663]mm · 3 of 78 slices shown]
[im 8/78  bone]
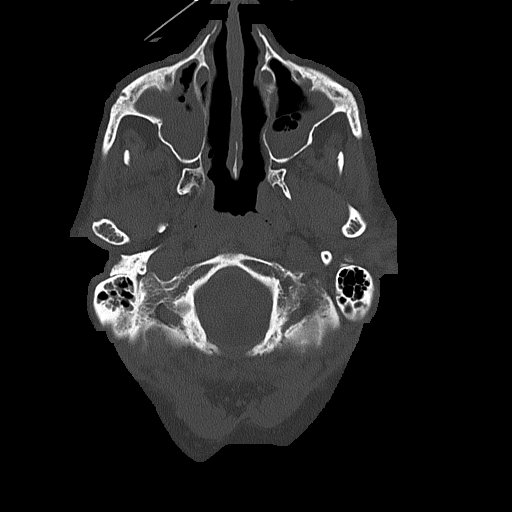
[im 16/78  bone]
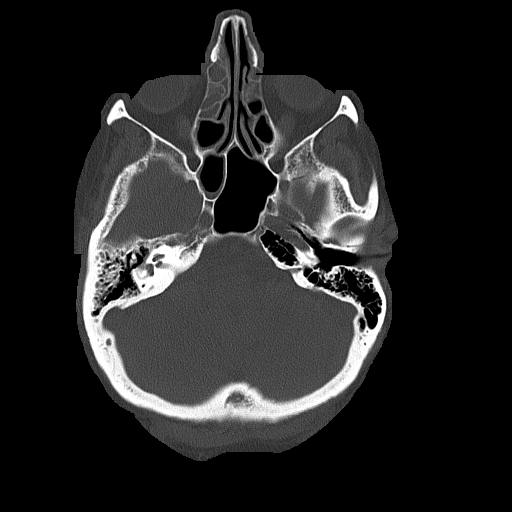
[im 24/78  bone]
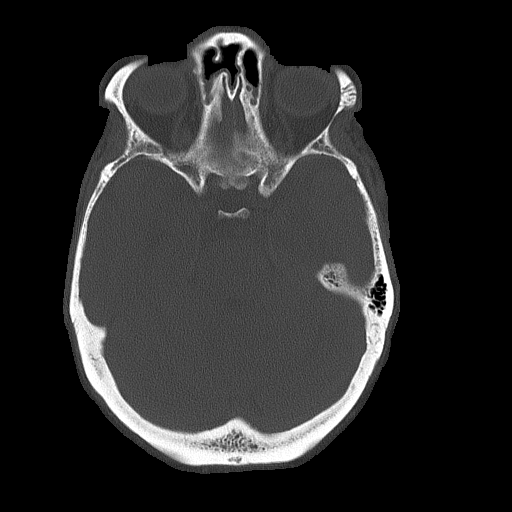

[Series 4: coronal soft tissue · coronal · 0.30mm/px · 3 of 67 slices shown]
[im 23/67  brain]
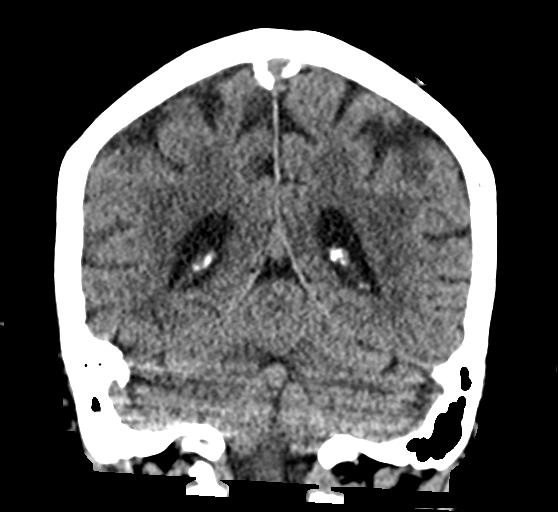
[im 30/67  brain]
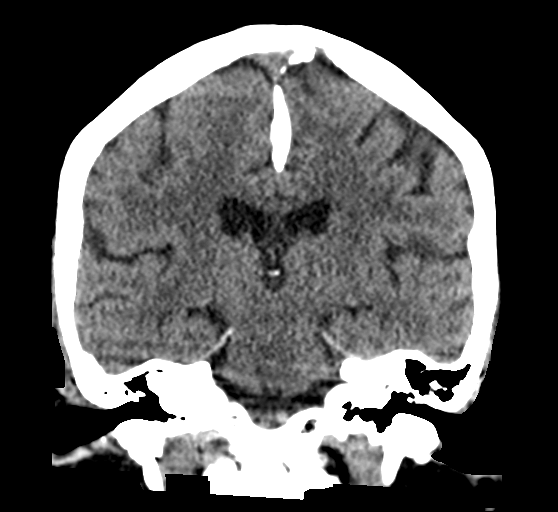
[im 37/67  brain]
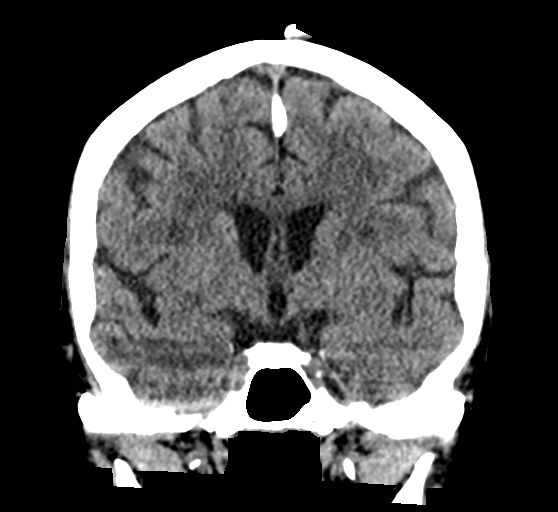

[Series 5: sagittal soft tissue · sagittal · 0.36mm/px · 3 of 57 slices shown]
[im 19/57  brain]
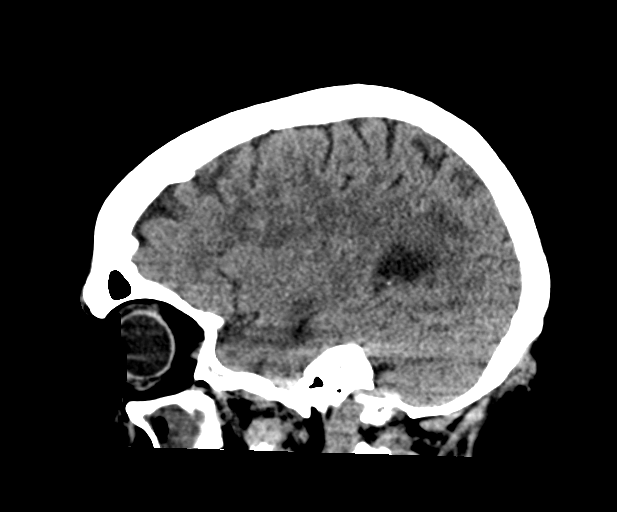
[im 29/57  brain]
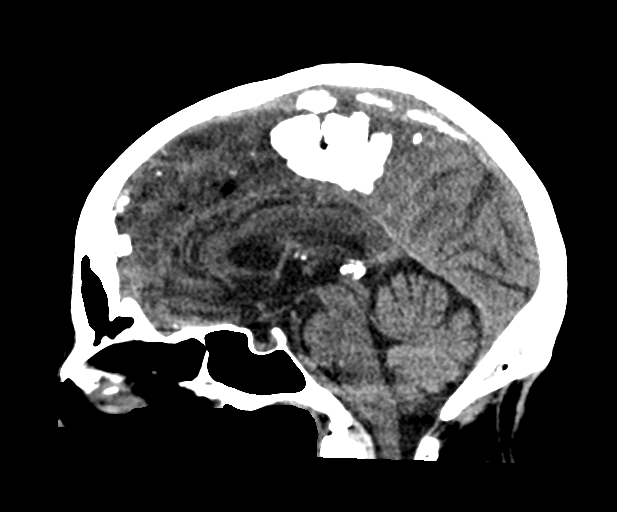
[im 38/57  brain]
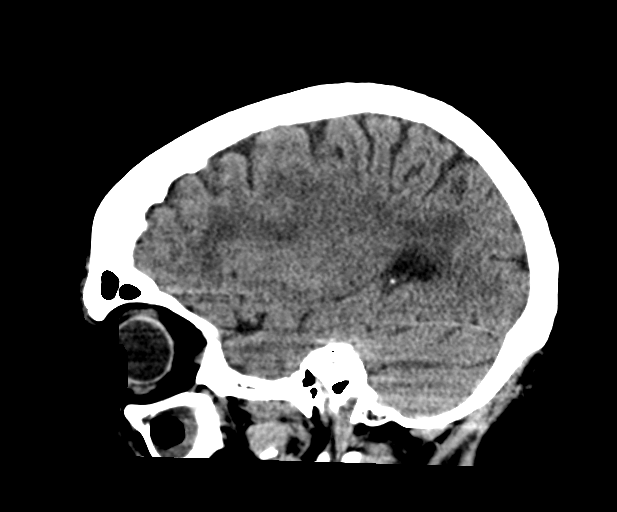

[16 of 47 positions shown; findings below may reference images not displayed]

FINDINGS: Brain: Scattered hypodensities are seen throughout the
periventricular and subcortical white matter as well as the left
basal ganglia, consistent with age-indeterminate small vessel
ischemic changes. No other signs of acute infarct or hemorrhage.
Lateral ventricles and remaining midline structures are
unremarkable. No acute extra-axial collections. No mass effect.

Vascular: No hyperdense vessel or unexpected calcification.

Skull: Normal. Negative for fracture or focal lesion.

Sinuses/Orbits: Near complete opacification of the bilateral
maxillary sinuses and anterior ethmoid air cells.

Other: None.
IMPRESSION: 1. Age-indeterminate small-vessel ischemic changes throughout the
periventricular white matter and left basal ganglia, favor chronic.
2. No acute hemorrhage.

## 2022-03-03 ENCOUNTER — Other Ambulatory Visit: Payer: Self-pay | Admitting: Family Medicine

## 2022-03-21 DIAGNOSIS — Z961 Presence of intraocular lens: Secondary | ICD-10-CM | POA: Diagnosis not present

## 2022-04-20 ENCOUNTER — Other Ambulatory Visit: Payer: Self-pay | Admitting: Family Medicine

## 2022-04-20 DIAGNOSIS — E669 Obesity, unspecified: Secondary | ICD-10-CM

## 2022-04-20 NOTE — Telephone Encounter (Signed)
LOV 09/09/21 NOV 04/28/22 LR 09/29/21 # 30 with 3 refills

## 2022-04-26 NOTE — Progress Notes (Signed)
I,[name],acting as a scribe for Mila Merryonald Rashay Barnette, MD.,have documented all relevant documentation on the behalf of Mila MerryDonald Galdino Hinchman, MD,as directed by  Mila Merryonald Nastashia Gallo, MD while in the presence of Mila Merryonald Dashia Caldeira, MD.   Complete physical exam   Patient: Gabrielle Salinas   DOB: 02-Feb-1951   71 y.o. Female  MRN: [id number] Visit Date: 04/28/2022  Today's healthcare provider: Mila Merryonald Supriya Beaston, MD   Chief Complaint  Patient presents with   Annual Exam   Hyperlipidemia   Prediabetes   Subjective    Gabrielle Salinas is a 71 y.o. female who presents today for a complete physical exam.  She reports consuming a general diet. Home exercise routine includes walking. She generally feels fairly well. She reports sleeping fairly well. She does not have additional problems to discuss today.   HPI  AWV done:10/31/2021  Lipid/Cholesterol, Follow-up  Last lipid panel Other pertinent labs  Lab Results  Component Value Date   CHOL 218 (H) 04/26/2021   HDL 67 04/26/2021   LDLCALC 124 (H) 04/26/2021   LDLDIRECT 136 (H) 05/21/2019   TRIG 156 (H) 04/26/2021   CHOLHDL 3.3 04/26/2021   Lab Results  Component Value Date   ALT 28 11/30/2021   AST 31 11/30/2021   PLT 222 12/04/2021   TSH 0.976 07/03/2018     She was last seen for this 1  year  ago.  Management since that visit includes continuing same medication.  She reports good compliance with treatment. She is not having side effects.   Symptoms: No chest pain No chest pressure/discomfort  No dyspnea No lower extremity edema  No numbness or tingling of extremity No orthopnea  No palpitations No paroxysmal nocturnal dyspnea  No speech difficulty No syncope   Current diet: well balanced Current exercise: walking  The 10-year ASCVD risk score (Arnett DK, et al., 2019) is: 12.7%  ---------------------------------------------------------------------------------------------------   Prediabetes, Follow-up  Lab Results  Component  Value Date   HGBA1C 6.2 (H) 04/26/2021   HGBA1C 6.1 (A) 08/03/2020   HGBA1C 6.0 (H) 02/26/2020   GLUCOSE 165 (H) 12/01/2021   GLUCOSE 167 (H) 11/30/2021   GLUCOSE 139 (H) 11/29/2021    Last seen for for this1  year  ago.  Management since that visit includes continuing lifestyle modififactions. Current symptoms include none and have been stable.  Prior visit with dietician: no Current diet: well balanced Current exercise: walking  Pertinent Labs:    Component Value Date/Time   CHOL 218 (H) 04/26/2021 1456   TRIG 156 (H) 04/26/2021 1456   CHOLHDL 3.3 04/26/2021 1456   CREATININE 0.60 12/01/2021 0429    Wt Readings from Last 3 Encounters:  04/28/22 167 lb (75.8 kg)  11/28/21 164 lb (74.4 kg)  10/31/21 171 lb 9.6 oz (77.8 kg)    -----------------------------------------------------------------------------------------   Past Medical History:  Diagnosis Date   Allergy    Anxiety    Cataract    COVID-19 virus infection 11/29/2021   Depression    GERD (gastroesophageal reflux disease)    Hyperlipidemia    Hypertension    Osteoporosis    Past Surgical History:  Procedure Laterality Date   BREAST BIOPSY Right <10 yrs ago   x2 benign results   CATARACT EXTRACTION, BILATERAL  09/2019   COLONOSCOPY  11/19/2017   GI Bleed  2008   RHINOPLASTY     TOTAL HIP ARTHROPLASTY Right 11/29/2021   Procedure: TOTAL HIP ARTHROPLASTY ANTERIOR APPROACH;  Surgeon: Kennedy BuckerMenz, Michael, MD;  [facility]  [facility];  Service: Orthopedics;  Laterality: Right;   UPPER GASTROINTESTINAL ENDOSCOPY  10/2017   [facility]   VAGINAL HYSTERECTOMY  1982   due to abnormal pap smears and menorrhagia   Social History   Socioeconomic History   Marital status: Widowed    Spouse name: Not on file   Number of children: 3   Years of education: Not on file   Highest education level: Associate degree: academic program  Occupational History   Occupation: retired    Comment: keeps granddaughter  Tobacco Use   Smoking  status: Never   Smokeless tobacco: Never  Vaping Use   Vaping Use: Never used  Substance and Sexual Activity   Alcohol use: Yes    Alcohol/week: 0.0 standard drinks    Comment: 1glass of wine on special occasions   Drug use: Never   Sexual activity: Not on file  Other Topics Concern   Not on file  Social History Narrative   Not on file   Social Determinants of Health   Financial Resource Strain: Low Risk    Difficulty of Paying Living Expenses: Not hard at all  Food Insecurity: No Food Insecurity   Worried About Programme researcher, broadcasting/film/video in the Last Year: Never true   Ran Out of Food in the Last Year: Never true  Transportation Needs: No Transportation Needs   Lack of Transportation (Medical): No   Lack of Transportation (Non-Medical): No  Physical Activity: Inactive   Days of Exercise per Week: 0 days   Minutes of Exercise per Session: 0 min  Stress: No Stress Concern Present   Feeling of Stress : Not at all  Social Connections: Moderately Isolated   Frequency of Communication with Friends and Family: More than three times a week   Frequency of Social Gatherings with Friends and Family: Once a week   Attends Religious Services: More than 4 times per year   Active Member of [employer] or Organizations: No   Attends Banker Meetings: Never   Marital Status: Widowed  Catering manager Violence: Not At Risk   Fear of Current or Ex-Partner: No   Emotionally Abused: No   Physically Abused: No   Sexually Abused: No   Family Status  Relation Name Status   Mother  Deceased at age 4       Cause of Death: Complications of SLE   Father  Deceased at age 26       Cause of Death: MI   Sister  Alive   Sister  Alive   Sister  Alive   Neg Hx  (Not Specified)   Family History  Problem Relation Age of Onset   Lupus Mother    Heart attack Father    Diabetes Father    Diabetes Sister    Diabetes Sister    Diabetes Sister    Breast cancer Neg Hx    Allergies  Allergen  Reactions   Bactrim [Sulfamethoxazole-Trimethoprim] Itching   Bee Venom Swelling   Daypro  [Oxaprozin]    Sulfa Antibiotics Diarrhea   Sulfasalazine     Patient Care Team: Malva Limes, MD as PCP - General (Family Medicine) Patty, A. Azucena Kuba, MD as Consulting Physician (Ophthalmology) Juanell Fairly, MD as Referring Physician (Orthopedic Surgery) Elwin Mocha, MD as Consulting Physician (Ophthalmology)   Medications: Outpatient Medications Prior to Visit  Medication Sig   acetaminophen (TYLENOL) 325 MG tablet Take 650 mg by mouth every 4 (four) hours as needed for mild pain or  fever.   alendronate (FOSAMAX) 70 MG tablet TAKE 1 TABLET BY MOUTH EVERY WEEK WITH A FULL GLASS OF WATER AND ON AN EMPTY STOMACH   ALPRAZolam (XANAX) 0.25 MG tablet TAKE 1 TO 2 TABLETS BY MOUTH EVERY 4 HOURS AS NEEDED FOR ANXIETY. DO NOT EXCEED 4 TABLETS DAILY   Calcium Carb-Cholecalciferol 600-500 MG-UNIT CAPS Take 600 Units by mouth daily.   clorazepate (TRANXENE) 15 MG tablet TAKE 1/2 TABLET BY MOUTH THREE TIMES DAILY AS NEEDED   esomeprazole (NEXIUM) 40 MG capsule TAKE 1 CAPSULE BY MOUTH EVERY DAY AT NOON   ezetimibe (ZETIA) 10 MG tablet TAKE 1 TABLET(10 MG) BY MOUTH DAILY   FLUoxetine (PROZAC) 20 MG capsule TAKE 1 CAPSULE(20 MG) BY MOUTH DAILY   hydrochlorothiazide (HYDRODIURIL) 12.5 MG tablet TAKE 1 TABLET(12.5 MG) BY MOUTH DAILY   lisinopril (ZESTRIL) 10 MG tablet TAKE 1 TABLET(10 MG) BY MOUTH DAILY   Misc Natural Products (JOINT SUPPORT) CAPS Take 1 capsule by mouth daily.   montelukast (SINGULAIR) 10 MG tablet TAKE 1 TABLET BY MOUTH EVERY DAY FOR ALLERGIES   Multiple Vitamins-Minerals (MULTIVITAMIN ADULT PO) Take 1 tablet by mouth daily.   Multiple Vitamins-Minerals (PRESERVISION AREDS 2 PO) Take 1 tablet by mouth 2 (two) times daily.   Omega-3 Fatty Acids (FISH OIL) 1000 MG CAPS Take 1 capsule by mouth daily.    Turmeric 500 MG CAPS Take 1 capsule by mouth daily.   vitamin C (ASCORBIC  ACID) 500 MG tablet Take 500 mg by mouth daily.   vitamin E 1000 UNIT capsule Take 1,000 Units by mouth daily.    phentermine 30 MG capsule TAKE 1 CAPSULE(30 MG) BY MOUTH EVERY MORNING (Patient not taking: Reported on 04/28/2022)   topiramate (TOPAMAX) 50 MG tablet Take 1 tablet (50 mg total) by mouth 2 (two) times daily. (Patient not taking: Reported on 04/28/2022)   [DISCONTINUED] enoxaparin (LOVENOX) 40 MG/0.4ML injection Inject 0.4 mLs (40 mg total) into the skin daily for 14 days.   [DISCONTINUED] HYDROcodone-acetaminophen (NORCO/VICODIN) 5-325 MG tablet Take 1 tablet by mouth every 4 (four) hours as needed for moderate pain.   [DISCONTINUED] triamcinolone cream (KENALOG) 0.5 % Apply 1 application topically daily as needed.   No facility-administered medications prior to visit.    Review of Systems  Constitutional:  Negative for chills, fatigue and fever.  HENT:  Negative for congestion, ear pain, rhinorrhea, sneezing and sore throat.   Eyes: Negative.  Negative for pain and redness.  Respiratory:  Negative for cough, shortness of breath and wheezing.   Cardiovascular:  Negative for chest pain and leg swelling.  Gastrointestinal:  Negative for abdominal pain, blood in stool, constipation, diarrhea and nausea.  Endocrine: Negative for polydipsia and polyphagia.  Genitourinary: Negative.  Negative for dysuria, flank pain, hematuria, pelvic pain, vaginal bleeding and vaginal discharge.  Musculoskeletal:  Negative for arthralgias, back pain, gait problem and joint swelling.  Skin:  Negative for rash.  Neurological: Negative.  Negative for dizziness, tremors, seizures, weakness, light-headedness, numbness and headaches.  Hematological:  Negative for adenopathy.  Psychiatric/Behavioral: Negative.  Negative for behavioral problems, confusion and dysphoric mood. The patient is not nervous/anxious and is not hyperactive.      Objective     BP 115/71 (BP Location: Left Arm, Patient Position:  Sitting, Cuff Size: Large)   Pulse 87   Temp 97.9 F (36.6 C) (Oral)   Resp 14   Ht  (1.6 m)   Wt 167 lb (75.8 kg)   LMP 11/27/1980  SpO2 100% Comment: room air  BMI 29.58 kg/m     Physical Exam   General Appearance:     Overweight female. Alert, cooperative, in no acute distress, appears stated age   Head:    Normocephalic, without obvious abnormality, atraumatic  Eyes:    PERRL, conjunctiva/corneas clear, EOM's intact, fundi    benign, both eyes  Ears:    Normal TM's and external ear canals, both ears  Nose:   Nares normal, septum midline, mucosa normal, no drainage    or sinus tenderness  Throat:   Lips, mucosa, and tongue normal; teeth and gums normal  Neck:   Supple, symmetrical, trachea midline, no adenopathy;    thyroid:  no enlargement/tenderness/nodules; no carotid   bruit or JVD  Back:     Symmetric, no curvature, ROM normal, no CVA tenderness  Lungs:     Clear to auscultation bilaterally, respirations unlabored  Chest Wall:    No tenderness or deformity   Heart:    Normal heart rate. Normal rhythm. No murmurs, rubs, or gallops.   Breast Exam:    normal appearance, no masses or tenderness  Abdomen:     Soft, non-tender, bowel sounds active all four quadrants,    no masses, no organomegaly  Pelvic:    deferred, not indicated; post-menopausal, no abnormal Pap smears in past, and not indicated; status post hysterectomy, negative ROS  Extremities:   All extremities are intact. No cyanosis or edema  Pulses:   2+ and symmetric all extremities  Skin:   Skin color, texture, turgor normal, no rashes or lesions  Lymph nodes:   Cervical, supraclavicular, and axillary nodes normal  Neurologic:   CNII-XII intact, normal strength, sensation and reflexes    throughout     Last depression screening scores    04/28/2022    9:20 AM 10/31/2021    3:12 PM 09/09/2021    1:37 PM  PHQ 2/9 Scores  PHQ - 2 Score 1 1 2   PHQ- 9 Score 3 1 6    Last fall risk screening     04/28/2022    9:11 AM  Fall Risk   Falls in the past year? 1  Number falls in past yr: 1  Injury with Fall? 1  Risk for fall due to : History of fall(s)  Follow up Falls prevention discussed   Last Audit-C alcohol use screening    04/28/2022    9:27 AM  Alcohol Use Disorder Test (AUDIT)  1. How often do you have a drink containing alcohol? 1  2. How many drinks containing alcohol do you have on a typical day when you are drinking? 0  3. How often do you have six or more drinks on one occasion? 0  AUDIT-C Score 1   A score of 3 or more in women, and 4 or more in men indicates increased risk for alcohol abuse, EXCEPT if all of the points are from question 1   No results found for any visits on 04/28/22.  Assessment & Plan    Routine Health Maintenance and Physical Exam  Exercise Activities and Dietary recommendations  Goals      DIET - INCREASE WATER INTAKE     Recommend 6-8 glasses of water per day         Immunization History  Administered Date(s) Administered   Fluad Quad(high Dose 65+) 08/03/2020, 09/09/2021   Influenza, High Dose Seasonal PF 09/02/2016, 09/28/2017, 09/30/2018, 08/22/2019   PFIZER(Purple Top)SARS-COV-2 Vaccination 01/09/2020, 02/02/2020, 09/15/2020  Pneumococcal Conjugate-13 07/30/2014   Pneumococcal Polysaccharide-23 10/22/1998, 03/14/2016   Td 07/23/1995   Tdap 09/29/2011, 04/28/2021   Zoster Recombinat (Shingrix) 08/22/2019, 10/30/2019   Zoster, Live 07/30/2014    Health Maintenance  Topic Date Due   DEXA SCAN  10/29/2021   MAMMOGRAM  01/04/2022   COVID-19 Vaccine (4 - Booster for Pfizer series) 11/21/2022 (Originally 11/10/2020)   INFLUENZA VACCINE  06/27/2022   Fecal DNA (Cologuard)  05/12/2023   TETANUS/TDAP  04/29/2031   Pneumonia Vaccine 37+ Years old  Completed   Hepatitis C Screening  Completed   Zoster Vaccines- Shingrix  Completed   HPV VACCINES  Aged Out   COLONOSCOPY (Pts 45-61yrs Insurance coverage will need to be confirmed)   Discontinued    Discussed health benefits of physical activity, and encouraged her to engage in regular exercise appropriate for her age and condition.   Future Appointments  Date Time Provider Department Center  05/09/2022 10:00 AM Mecum, Oswaldo Conroy, PA-C [facility]  11/01/2022  2:20 PM [facility]-NURSE HEALTH ADVISOR [facility]       The entirety of the information documented in the History of Present Illness, Review of Systems and Physical Exam were personally obtained by me. Portions of this information were initially documented by the CMA and reviewed by me for thoroughness and accuracy.     [name], MD  [facility] [phone] (phone) [phone] (fax)  [facility]

## 2022-04-28 ENCOUNTER — Ambulatory Visit (INDEPENDENT_AMBULATORY_CARE_PROVIDER_SITE_OTHER): Payer: Medicare Other | Admitting: Family Medicine

## 2022-04-28 ENCOUNTER — Encounter: Payer: Self-pay | Admitting: Family Medicine

## 2022-04-28 ENCOUNTER — Other Ambulatory Visit: Payer: Self-pay | Admitting: Family Medicine

## 2022-04-28 VITALS — BP 115/71 | HR 87 | Temp 97.9°F | Resp 14 | Ht 63.0 in | Wt 167.0 lb

## 2022-04-28 DIAGNOSIS — Z0000 Encounter for general adult medical examination without abnormal findings: Secondary | ICD-10-CM | POA: Diagnosis not present

## 2022-04-28 DIAGNOSIS — R7303 Prediabetes: Secondary | ICD-10-CM | POA: Diagnosis not present

## 2022-04-28 DIAGNOSIS — E785 Hyperlipidemia, unspecified: Secondary | ICD-10-CM

## 2022-04-28 DIAGNOSIS — I10 Essential (primary) hypertension: Secondary | ICD-10-CM | POA: Diagnosis not present

## 2022-04-28 DIAGNOSIS — M810 Age-related osteoporosis without current pathological fracture: Secondary | ICD-10-CM | POA: Diagnosis not present

## 2022-04-28 NOTE — Patient Instructions (Signed)
Please review the attached list of medications and notify my office if there are any errors.  ? ?Please call the [facility] ([phone]) to schedule a routine screening mammogram. ? ?

## 2022-04-29 LAB — LIPID PANEL
Chol/HDL Ratio: 3.8 ratio (ref 0.0–4.4)
Cholesterol, Total: 237 mg/dL — ABNORMAL HIGH (ref 100–199)
HDL: 62 mg/dL (ref >39)
LDL Chol Calc (NIH): 142 mg/dL — ABNORMAL HIGH (ref 0–99)
Triglycerides: 186 mg/dL — ABNORMAL HIGH (ref 0–149)
VLDL Cholesterol Cal: 33 mg/dL (ref 5–40)

## 2022-04-29 LAB — RENAL FUNCTION PANEL
Albumin: 4.6 g/dL (ref 3.7–4.7)
BUN/Creatinine Ratio: 23 (ref 12–28)
BUN: 18 mg/dL (ref 8–27)
CO2: 25 mmol/L (ref 20–29)
Calcium: 9.5 mg/dL (ref 8.7–10.3)
Chloride: 96 mmol/L (ref 96–106)
Creatinine, Ser: 0.77 mg/dL (ref 0.57–1.00)
Glucose: 120 mg/dL — ABNORMAL HIGH (ref 70–99)
Phosphorus: 3.9 mg/dL (ref 3.0–4.3)
Potassium: 3.6 mmol/L (ref 3.5–5.2)
Sodium: 136 mmol/L (ref 134–144)
eGFR: 82 mL/min/{1.73_m2} (ref >59)

## 2022-04-29 LAB — CBC
Hematocrit: 39.8 % (ref 34.0–46.6)
Hemoglobin: 13.3 g/dL (ref 11.1–15.9)
MCH: 29.4 pg (ref 26.6–33.0)
MCHC: 33.4 g/dL (ref 31.5–35.7)
MCV: 88 fL (ref 79–97)
Platelets: 293 10*3/uL (ref 150–450)
RBC: 4.53 x10E6/uL (ref 3.77–5.28)
RDW: 15.8 % — ABNORMAL HIGH (ref 11.7–15.4)
WBC: 6.7 10*3/uL (ref 3.4–10.8)

## 2022-04-29 LAB — VITAMIN D 25 HYDROXY (VIT D DEFICIENCY, FRACTURES): Vit D, 25-Hydroxy: 29.7 ng/mL — ABNORMAL LOW (ref 30.0–100.0)

## 2022-04-29 LAB — TSH: TSH: 1.67 u[IU]/mL (ref 0.450–4.500)

## 2022-04-29 LAB — HEMOGLOBIN A1C
Est. average glucose Bld gHb Est-mCnc: 131 mg/dL
Hgb A1c MFr Bld: 6.2 % — ABNORMAL HIGH (ref 4.8–5.6)

## 2022-05-07 ENCOUNTER — Other Ambulatory Visit: Payer: Self-pay | Admitting: Family Medicine

## 2022-05-07 DIAGNOSIS — E785 Hyperlipidemia, unspecified: Secondary | ICD-10-CM

## 2022-05-07 MED ORDER — EZETIMIBE 10 MG PO TABS: ORAL_TABLET | ORAL | 3 refills | Status: DC

## 2022-05-08 ENCOUNTER — Other Ambulatory Visit: Payer: Self-pay | Admitting: Family Medicine

## 2022-05-09 ENCOUNTER — Encounter: Payer: Self-pay | Admitting: Physician Assistant

## 2022-05-09 ENCOUNTER — Ambulatory Visit (INDEPENDENT_AMBULATORY_CARE_PROVIDER_SITE_OTHER): Payer: Medicare Other | Admitting: Physician Assistant

## 2022-05-09 VITALS — BP 121/76 | HR 91 | Temp 98.2°F | Resp 16 | Ht 63.0 in | Wt 173.2 lb

## 2022-05-09 DIAGNOSIS — F32A Depression, unspecified: Secondary | ICD-10-CM

## 2022-05-09 DIAGNOSIS — R7303 Prediabetes: Secondary | ICD-10-CM | POA: Diagnosis not present

## 2022-05-09 DIAGNOSIS — F411 Generalized anxiety disorder: Secondary | ICD-10-CM | POA: Diagnosis not present

## 2022-05-09 DIAGNOSIS — E559 Vitamin D deficiency, unspecified: Secondary | ICD-10-CM | POA: Diagnosis not present

## 2022-05-09 DIAGNOSIS — E785 Hyperlipidemia, unspecified: Secondary | ICD-10-CM | POA: Diagnosis not present

## 2022-05-09 NOTE — Assessment & Plan Note (Signed)
Chronic, ongoing Reviewed lipid panel results  Cholesterol levels are increased from last year She has had reduced physical activity this year due to hip fracture and injuries She is taking Zetia 10 mg PO QD for management Recommend continuing medications at this time Encouraged gradual increase in exercise Encouraged dietary changes to reduce cholesterol intake  Follow up in 6 months
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
## 2022-05-09 NOTE — Assessment & Plan Note (Signed)
Chronic, historic condition A1c 6.2 % - stable compared to previous results Encouraged to increase exercise efforts now that she is recovered from recent hip fracture Reviewed dietary recommendations for diabetes and hyperlipidemia Recheck in 3 months for monitoring Follow up in 3 months

## 2022-05-09 NOTE — Patient Instructions (Addendum)
I recommend seeking out therapy services to assist with your depression and anxiety concerns in addition to your medications. These typically help augment one another and are best when used in conjunction.    I recommend supplementing with Vitamin D to assist with your Vitamin D levels  You can take a supplement with 600-800 IU of Vitamin D per day and we will recheck your levels in 6 months to see how they are responding.    To help with your cholesterol I recommend eating plenty of vegetables daily and fruit. Try to consume whole grains, lean proteins such as chicken, fish and plant-based protein. Try to gradually increase your daily fiber intake as well as this is usually more filling and can help improve heart health.  Try to exercise and get at least 150 minutes of moderate intensity physical activity every week   It was nice to meet you and I appreciate the opportunity to be involved in your care

## 2022-05-09 NOTE — Assessment & Plan Note (Signed)
Chronic, historic condition Patient has multiple sources of anxiety and overall tangential thought process today- easily redirectable but returns to concerns Recommend eval with psychology/ therapy services to assist with underlying anxiety and past trauma Continue Prozac daily  Has script for Alprazolam QID PRN - would recommend prolonged taper given age.  Follow up as needed

## 2022-05-12 ENCOUNTER — Other Ambulatory Visit: Payer: Self-pay | Admitting: Family Medicine

## 2022-05-12 DIAGNOSIS — E785 Hyperlipidemia, unspecified: Secondary | ICD-10-CM

## 2022-05-24 ENCOUNTER — Telehealth: Payer: Self-pay

## 2022-05-24 DIAGNOSIS — Z1231 Encounter for screening mammogram for malignant neoplasm of breast: Secondary | ICD-10-CM

## 2022-05-24 DIAGNOSIS — E2839 Other primary ovarian failure: Secondary | ICD-10-CM

## 2022-05-24 NOTE — Telephone Encounter (Unsigned)
Copied from CRM [id number]. Topic: Appointment Scheduling - Scheduling Inquiry for Clinic >> May 24, 2022  4:22 PM Dondra Prader E wrote: Reason for CRM: Pt called to report that she spoke to [facility] and they told her that they do not have any orders and that her PCP needs to submit orders for both mammogram and bone density. Please advise

## 2022-05-29 NOTE — Addendum Note (Signed)
Addended by: [name] E on: 05/29/2022 10:05 AM   Modules accepted: Orders

## 2022-06-03 ENCOUNTER — Other Ambulatory Visit: Payer: Self-pay | Admitting: Family Medicine

## 2022-06-08 ENCOUNTER — Telehealth: Payer: Self-pay | Admitting: Family Medicine

## 2022-06-08 DIAGNOSIS — E669 Obesity, unspecified: Secondary | ICD-10-CM

## 2022-06-08 NOTE — Telephone Encounter (Signed)
Medication Refill - Medication: phentermine hcl She is requesting the 15 mg instead of 30 mg, because when taking 30 she says seems to make her heart race, but not right now Has the patient contacted their pharmacy? yes (Agent: If no, request that the patient contact the pharmacy for the refill. If patient does not wish to contact the pharmacy document the reason why and proceed with request.) (Agent: If yes, when and what did the pharmacy advise?)patient was  put on hold/ never came back to her  Preferred Pharmacy (with phone number or street name): [facility] #[id number] [place], [place] - [address] AT [facility] [address] Phone:  [phone]  Fax:  [phone]     Has the patient been seen for an appointment in the last year OR does the patient have an upcoming appointment? yes  Agent: Please be advised that RX refills may take up to 3 business days. We ask that you follow-up with your pharmacy.

## 2022-06-09 MED ORDER — PHENTERMINE HCL 15 MG PO CAPS: 15.0000 mg | ORAL_CAPSULE | ORAL | 1 refills | Status: DC

## 2022-06-16 ENCOUNTER — Other Ambulatory Visit: Payer: Self-pay | Admitting: Family Medicine

## 2022-06-20 ENCOUNTER — Other Ambulatory Visit: Payer: Self-pay | Admitting: Family Medicine

## 2022-06-20 DIAGNOSIS — E669 Obesity, unspecified: Secondary | ICD-10-CM

## 2022-06-22 ENCOUNTER — Other Ambulatory Visit: Payer: Self-pay | Admitting: Family Medicine

## 2022-06-23 NOTE — Telephone Encounter (Signed)
Please review. Last office visit 05/09/2022.  KP

## 2022-06-28 ENCOUNTER — Other Ambulatory Visit: Payer: Self-pay | Admitting: Family Medicine

## 2022-06-28 DIAGNOSIS — W57XXXA Bitten or stung by nonvenomous insect and other nonvenomous arthropods, initial encounter: Secondary | ICD-10-CM

## 2022-06-28 NOTE — Telephone Encounter (Signed)
Please check with patient to see if she wants to start back on this. Looks like it is an automated refill request from the pharmacy.

## 2022-06-28 NOTE — Telephone Encounter (Signed)
Please review. Prescription is not listed on active medication list.

## 2022-06-28 NOTE — Telephone Encounter (Signed)
I called and spoke with patient. She states she did request a refill. She uses the cream to help with relief from insect bites like mosquitos and fly's. She has tried using OTC hydrocortisone cream in the past but it doesn't help relieve the itching.

## 2022-07-05 ENCOUNTER — Other Ambulatory Visit: Payer: Self-pay | Admitting: Family Medicine

## 2022-07-05 DIAGNOSIS — F411 Generalized anxiety disorder: Secondary | ICD-10-CM

## 2022-07-12 ENCOUNTER — Other Ambulatory Visit: Payer: Self-pay | Admitting: Family Medicine

## 2022-07-12 NOTE — Telephone Encounter (Signed)
Requested medication (s) are due for refill today: Due 07/24/22  Requested medication (s) are on the active medication list: yes    Last refill: 06/23/22  #30  0 refills  Future visit scheduled yes 08/11/22  Notes to clinic:Not delegated, please review  Requested Prescriptions  Pending Prescriptions Disp Refills   ALPRAZolam (XANAX) 0.25 MG tablet [Pharmacy Med Name: ALPRAZOLAM 0.25MG  TABLETS] 30 tablet     Sig: TAKE 1 TABLET(0.25 MG) BY MOUTH DAILY AS NEEDED FOR ANXIETY     Not Delegated - Psychiatry: Anxiolytics/Hypnotics 2 Failed - 07/12/2022 12:23 PM      Failed - This refill cannot be delegated      Failed - Urine Drug Screen completed in last 360 days      Passed - Patient is not pregnant      Passed - Valid encounter within last 6 months    Recent Outpatient Visits           2 months ago Depressive disorder   [facility], Erin E, PA-C   2 months ago Osteoporosis, unspecified osteoporosis type, unspecified pathological fracture presence   [facility] Malva Limes, MD   8 months ago Viral URI with cough   [facility], Erin E, PA-C   10 months ago Obesity (BMI 30-39.9)   [facility] Malva Limes, MD   1 year ago Annual physical exam   [facility] Malva Limes, MD       Future Appointments             In 1 month Fisher, Demetrios Isaacs, MD [facility]

## 2022-07-19 ENCOUNTER — Ambulatory Visit
Admission: RE | Admit: 2022-07-19 | Discharge: 2022-07-19 | Disposition: A | Discharge status: Home / Self Care | Payer: Medicare Other | Source: Ambulatory Visit | Attending: Family Medicine | Admitting: Family Medicine

## 2022-07-19 DIAGNOSIS — Z1231 Encounter for screening mammogram for malignant neoplasm of breast: Secondary | ICD-10-CM | POA: Insufficient documentation

## 2022-07-19 DIAGNOSIS — E2839 Other primary ovarian failure: Secondary | ICD-10-CM | POA: Diagnosis present

## 2022-07-19 DIAGNOSIS — M85852 Other specified disorders of bone density and structure, left thigh: Secondary | ICD-10-CM | POA: Diagnosis not present

## 2022-08-10 ENCOUNTER — Ambulatory Visit: Payer: Self-pay | Admitting: *Deleted

## 2022-08-10 NOTE — Telephone Encounter (Signed)
Message from Randol Kern sent at 08/10/2022 12:31 PM EDT  Summary: Allergy symptoms, seeking advice   Pt has allergy symptoms, has tested negative for covid but wants advice. Has low energy and congestion.   Best contact: [phone]   ----- Message from Randol Kern sent at 08/10/2022 12:31 PM EDT -----  Pt has allergy symptoms, has tested negative for covid but wants advice. Has low energy and congestion.   Best contact: [phone]           Call History   Type Contact Phone/Fax User  08/10/2022 12:29 PM EDT Phone (Incoming) Gabrielle Salinas, Gabrielle Salinas (Self) [phone] Rexene Edison) Randol Kern

## 2022-08-10 NOTE — Telephone Encounter (Signed)
Voicemail left to call back to discuss symptoms of congestion and no energy with a nurse.

## 2022-08-10 NOTE — Telephone Encounter (Signed)
Summary: Allergy symptoms, seeking advice   Pt has allergy symptoms, has tested negative for covid but wants advice. Has low energy and congestion.   Best contact: [phone]   ----- Message from Randol Kern sent at 08/10/2022 12:31 PM EDT -----  Pt has allergy symptoms, has tested negative for covid but wants advice. Has low energy and congestion.   Best contact: [phone]

## 2022-08-11 ENCOUNTER — Ambulatory Visit: Payer: Medicare Other | Admitting: Family Medicine

## 2022-08-11 ENCOUNTER — Other Ambulatory Visit: Payer: Self-pay | Admitting: Family Medicine

## 2022-08-30 NOTE — Progress Notes (Signed)
I,[name],acting as a scribe for Lelon Huh, MD.,have documented all relevant documentation on the behalf of Lelon Huh, MD,as directed by  Lelon Huh, MD while in the presence of Lelon Huh, MD.   Established patient visit   Patient: Gabrielle Salinas   DOB: 10/13/1951   71 y.o. Female  MRN: [id number] Visit Date: 09/01/2022  Today's healthcare provider: Lelon Huh, MD   Chief Complaint  Patient presents with   Prediabetes   Subjective    HPI  Prediabetes, Follow-up  Lab Results  Component Value Date   HGBA1C 6.1 (A) 09/01/2022   HGBA1C 6.2 (H) 04/28/2022   HGBA1C 6.2 (H) 04/26/2021   GLUCOSE 120 (H) 04/28/2022   GLUCOSE 165 (H) 12/01/2021   GLUCOSE 167 (H) 11/30/2021    Last seen for for this 3 months ago.  Management since that visit includes encouraging patient to increase exercise efforts and improve diet. Current symptoms include none and have been stable.  Prior visit with dietician: no Current diet:  Keto Diet Current exercise: walking  Pertinent Labs:    Component Value Date/Time   CHOL 237 (H) 04/28/2022 0956   TRIG 186 (H) 04/28/2022 0956   CHOLHDL 3.8 04/28/2022 0956   CREATININE 0.77 04/28/2022 0956    Wt Readings from Last 3 Encounters:  09/01/22 171 lb (77.6 kg)  05/09/22 173 lb 3.2 oz (78.6 kg)  04/28/22 167 lb (75.8 kg)    -----------------------------------------------------------------------------------------   Anxiety and Depression, Follow-up  She was last seen for this problem 3 months ago. From that visit: Recommend eval with psychology/ therapy services to assist with underlying anxiety and past trauma Continue Prozac daily  Has script for Alprazolam QID PRN - would recommend prolonged taper given age. Has been out of alprazolam.    She reports poor compliance with treatment. Patient ran out of medication. She has had counseling with her pastor.  She reports good tolerance of treatment. She is not  having side effects.   She feels her anxiety is mild and Unchanged since last visit.  Symptoms: No chest pain No difficulty concentrating  No dizziness No fatigue  No feelings of losing control No insomnia  No irritable No palpitations  No panic attacks No racing thoughts  No shortness of breath No sweating  No tremors/shakes    GAD-7 Results     No data to display          PHQ-9 Scores    09/01/2022   10:48 AM 05/09/2022   10:08 AM 04/28/2022    9:20 AM  PHQ9 SCORE ONLY  PHQ-9 Total Score 3 1 3     ---------------------------------------------------------------------------------------------------   Medications: Outpatient Medications Prior to Visit  Medication Sig   acetaminophen (TYLENOL) 325 MG tablet Take 650 mg by mouth every 4 (four) hours as needed for mild pain or fever.   alendronate (FOSAMAX) 70 MG tablet TAKE 1 TABLET BY MOUTH EVERY WEEK WITH A FULL GLASS OF WATER AND ON AN EMPTY STOMACH   ALPRAZolam (XANAX) 0.25 MG tablet TAKE 1 TABLET(0.25 MG) BY MOUTH DAILY AS NEEDED FOR ANXIETY   azelastine (ASTELIN) 0.1 % nasal spray USE 2 SPRAYS IN EACH NOSTRIL TWICE DAILY AS DIRECTED   Calcium Carb-Cholecalciferol 600-500 MG-UNIT CAPS Take 600 Units by mouth daily.   clorazepate (TRANXENE) 15 MG tablet TAKE 1/2 TABLET BY MOUTH THREE TIMES DAILY AS NEEDED   esomeprazole (NEXIUM) 40 MG capsule TAKE 1 CAPSULE BY MOUTH EVERY DAY AT [facility]  ezetimibe (ZETIA) 10 MG tablet TAKE 1 TABLET(10 MG) BY MOUTH DAILY   FLUoxetine (PROZAC) 20 MG capsule TAKE 1 CAPSULE(20 MG) BY MOUTH DAILY   hydrochlorothiazide (HYDRODIURIL) 12.5 MG tablet TAKE 1 TABLET(12.5 MG) BY MOUTH DAILY   lisinopril (ZESTRIL) 10 MG tablet TAKE 1 TABLET(10 MG) BY MOUTH DAILY   Misc Natural Products (JOINT SUPPORT) CAPS Take 1 capsule by mouth daily.   montelukast (SINGULAIR) 10 MG tablet TAKE 1 TABLET BY MOUTH EVERY DAY FOR ALLERGIES   Multiple Vitamins-Minerals (MULTIVITAMIN ADULT PO) Take 1 tablet by mouth daily.    Multiple Vitamins-Minerals (PRESERVISION AREDS 2 PO) Take 1 tablet by mouth 2 (two) times daily.   Omega-3 Fatty Acids (FISH OIL) 1000 MG CAPS Take 1 capsule by mouth daily.    phentermine 15 MG capsule Take 1 capsule (15 mg total) by mouth every morning.   triamcinolone cream (KENALOG) 0.5 % APPLY TOPICALLY TO THE AFFECTED AREA DAILY AS NEEDED   Turmeric 500 MG CAPS Take 1 capsule by mouth daily.   vitamin C (ASCORBIC ACID) 500 MG tablet Take 500 mg by mouth daily.   vitamin E 1000 UNIT capsule Take 1,000 Units by mouth daily.    No facility-administered medications prior to visit.    Review of Systems  Constitutional:  Negative for appetite change, chills, fatigue and fever.  Respiratory:  Negative for chest tightness and shortness of breath.   Cardiovascular:  Negative for chest pain and palpitations.  Gastrointestinal:  Negative for abdominal pain, nausea and vomiting.  Neurological:  Negative for dizziness and weakness.       Objective    BP 120/61 (BP Location: Right Arm, Patient Position: Sitting, Cuff Size: Large)   Pulse 72   Temp 97.9 F (36.6 C) (Oral)   Resp 16   Wt 171 lb (77.6 kg)   LMP 11/27/1980   SpO2 100% Comment: room air  BMI 30.29 kg/m    Physical Exam  General appearance: Mildly obese female, cooperative and in no acute distress Head: Normocephalic, without obvious abnormality, atraumatic Respiratory: Respirations even and unlabored, normal respiratory rate Extremities: All extremities are intact.  Skin: Skin color, texture, turgor normal. No rashes seen  Psych: Appropriate mood and affect. Neurologic: Mental status: Alert, oriented to person, place, and time, thought content appropriate.   Results for orders placed or performed in visit on 09/01/22  POCT HgB A1C  Result Value Ref Range   Hemoglobin A1C 6.1 (A) 4.0 - 5.6 %   Est. average glucose Bld gHb Est-mCnc 128     Assessment & Plan     1. Prediabetes Doing well with diet and walking  regularly.   2. Need for immunization against influenza  - Flu Vaccine QUAD High Dose(Fluad)  3. Primary hypertension Well controlled.  Continue current medications.    4. Anxiety state   5. Depressive disorder Doing well with fluoxetine. Out of alprazolam at this time.   Future Appointments  Date Time Provider Department Center  11/01/2022  2:20 PM [facility] HEALTH ADVISOR [facility]  02/02/2023 10:40 AM Sherrie Mustache, Demetrios Isaacs, MD [facility]         The entirety of the information documented in the History of Present Illness, Review of Systems and Physical Exam were personally obtained by me. Portions of this information were initially documented by the CMA and reviewed by me for thoroughness and accuracy.     [name], MD  [facility] [phone] (phone) [phone] (fax)  [facility]

## 2022-09-01 ENCOUNTER — Encounter: Payer: Self-pay | Admitting: Family Medicine

## 2022-09-01 ENCOUNTER — Ambulatory Visit (INDEPENDENT_AMBULATORY_CARE_PROVIDER_SITE_OTHER): Payer: Medicare Other | Admitting: Family Medicine

## 2022-09-01 VITALS — BP 120/61 | HR 72 | Temp 97.9°F | Resp 16 | Wt 171.0 lb

## 2022-09-01 DIAGNOSIS — F32A Depression, unspecified: Secondary | ICD-10-CM | POA: Diagnosis not present

## 2022-09-01 DIAGNOSIS — Z23 Encounter for immunization: Secondary | ICD-10-CM

## 2022-09-01 DIAGNOSIS — I10 Essential (primary) hypertension: Secondary | ICD-10-CM

## 2022-09-01 DIAGNOSIS — F411 Generalized anxiety disorder: Secondary | ICD-10-CM

## 2022-09-01 DIAGNOSIS — R7303 Prediabetes: Secondary | ICD-10-CM | POA: Diagnosis not present

## 2022-09-01 LAB — POCT GLYCOSYLATED HEMOGLOBIN (HGB A1C)
Est. average glucose Bld gHb Est-mCnc: 128
Hemoglobin A1C: 6.1 % — AB (ref 4.0–5.6)

## 2022-09-01 NOTE — Patient Instructions (Signed)
.   Please review the attached list of medications and notify my office if there are any errors.   . Please bring all of your medications to every appointment so we can make sure that our medication list is the same as yours.   

## 2022-09-03 ENCOUNTER — Other Ambulatory Visit: Payer: Self-pay | Admitting: Family Medicine

## 2022-09-11 ENCOUNTER — Other Ambulatory Visit: Payer: Self-pay | Admitting: Family Medicine

## 2022-09-11 DIAGNOSIS — E669 Obesity, unspecified: Secondary | ICD-10-CM

## 2022-09-21 ENCOUNTER — Other Ambulatory Visit: Payer: Self-pay | Admitting: Family Medicine

## 2022-09-21 DIAGNOSIS — F411 Generalized anxiety disorder: Secondary | ICD-10-CM

## 2022-09-21 NOTE — Telephone Encounter (Signed)
Medication Refill - Medication: FLUoxetine (PROZAC) 20 MG capsule   ALPRAZolam (XANAX) 0.25 MG tablet  Has the patient contacted their pharmacy? Yes.   (Agent: If no, request that the patient contact the pharmacy for the refill. If patient does not wish to contact the pharmacy document the reason why and proceed with request.) (Agent: If yes, when and what did the pharmacy advise?) Pt is stating that she has been trying to get refills on this for a while and the pharmacy always tells her to call her dr. She thinks she is due refills, states has not had refill since before August. Pt seems confused wants Dr or Eli Hose call her back with something else if he does not want her to take. [phone]. She states she does not take every day or even every week.  Agent - Does script need to be resent did they not get refill? Or is it pt?  Preferred Pharmacy (with phone number or street name):  [facility] #[id number] [address], [place]  [place] [address]  Phone: [phone] Fax: [phone]  Hours: Not open 24 hours   Has the patient been seen for an appointment in the last year OR does the patient have an upcoming appointment? Yes.    Agent: Please be advised that RX refills may take up to 3 business days. We ask that you follow-up with your pharmacy.

## 2022-09-22 NOTE — Telephone Encounter (Signed)
Requested too early . Requested Prescriptions  Pending Prescriptions Disp Refills  . ALPRAZolam (XANAX) 0.25 MG tablet 30 tablet 3     Not Delegated - Psychiatry: Anxiolytics/Hypnotics 2 Failed - 09/21/2022 12:16 PM      Failed - This refill cannot be delegated      Failed - Urine Drug Screen completed in last 360 days      Passed - Patient is not pregnant      Passed - Valid encounter within last 6 months    Recent Outpatient Visits          3 weeks ago Prediabetes   [facility] Birdie Sons, MD   4 months ago Depressive disorder   [employer], Erin E, PA-C   4 months ago Osteoporosis, unspecified osteoporosis type, unspecified pathological fracture presence   [facility] Birdie Sons, MD   10 months ago Viral URI with cough   [employer], Erin E, PA-C   1 year ago Obesity (BMI 30-39.9)   [place], [place]      Future Appointments            In 4 months Fisher, Kirstie Peri, MD [facility]           Refused Prescriptions Disp Refills  . FLUoxetine (PROZAC) 20 MG capsule 90 capsule 4     Psychiatry:  Antidepressants - SSRI Passed - 09/21/2022 12:16 PM      Passed - Completed PHQ-2 or PHQ-9 in the last 360 days      Passed - Valid encounter within last 6 months    Recent Outpatient Visits          3 weeks ago Prediabetes   [facility] Birdie Sons, MD   4 months ago Depressive disorder   [employer], Erin E, PA-C   4 months ago Osteoporosis, unspecified osteoporosis type, unspecified pathological fracture presence   [facility] Birdie Sons, MD   10 months ago Viral URI with cough   [employer], Dani Gobble, PA-C   1 year ago Obesity (BMI 30-39.9)   [place], Kirstie Peri, MD      Future Appointments            In 4 months Fisher, Kirstie Peri,  MD [facility] [place]

## 2022-09-22 NOTE — Telephone Encounter (Signed)
Requested medication (s) are due for refill today: no  Requested medication (s) are on the active medication list: yes  Last refill:  07/13/22 #30 3 refills  Future visit scheduled: yes in 4 months  Notes to clinic:  not delegated per protocol. Requested too soon. Do you want to refill Rx? Called patient to review request too soon . No answer, LVMTCB     Requested Prescriptions  Pending Prescriptions Disp Refills   ALPRAZolam (XANAX) 0.25 MG tablet 30 tablet 3     Not Delegated - Psychiatry: Anxiolytics/Hypnotics 2 Failed - 09/21/2022 12:16 PM      Failed - This refill cannot be delegated      Failed - Urine Drug Screen completed in last 360 days      Passed - Patient is not pregnant      Passed - Valid encounter within last 6 months    Recent Outpatient Visits           3 weeks ago Prediabetes   [facility] Birdie Sons, MD   4 months ago Depressive disorder   [employer], Erin E, PA-C   4 months ago Osteoporosis, unspecified osteoporosis type, unspecified pathological fracture presence   [facility] Birdie Sons, MD   10 months ago Viral URI with cough   [employer], Erin E, PA-C   1 year ago Obesity (BMI 30-39.9)   [place], MD       Future Appointments             In 4 months Fisher, Kirstie Peri, MD [facility]            Refused Prescriptions Disp Refills   FLUoxetine (PROZAC) 20 MG capsule 90 capsule 4     Psychiatry:  Antidepressants - SSRI Passed - 09/21/2022 12:16 PM      Passed - Completed PHQ-2 or PHQ-9 in the last 360 days      Passed - Valid encounter within last 6 months    Recent Outpatient Visits           3 weeks ago Prediabetes   [facility] Birdie Sons, MD   4 months ago Depressive disorder   [place], Dani Gobble, PA-C   4 months ago Osteoporosis, unspecified  osteoporosis type, unspecified pathological fracture presence   [facility] Birdie Sons, MD   10 months ago Viral URI with cough   [employer], Dani Gobble, PA-C   1 year ago Obesity (BMI 30-39.9)   [place], Kirstie Peri, MD       Future Appointments             In 4 months Fisher, Kirstie Peri, MD [facility], [place]

## 2022-09-22 NOTE — Telephone Encounter (Signed)
Called patient to review medication refills are not due until November. No answer, LVMTCB [phone]

## 2022-09-23 MED ORDER — ALPRAZOLAM 0.25 MG PO TABS: ORAL_TABLET | ORAL | 3 refills | Status: DC

## 2022-11-01 ENCOUNTER — Ambulatory Visit (INDEPENDENT_AMBULATORY_CARE_PROVIDER_SITE_OTHER): Payer: Medicare Other

## 2022-11-01 VITALS — BP 140/80 | Ht 63.0 in | Wt 171.9 lb

## 2022-11-01 DIAGNOSIS — Z0000 Encounter for general adult medical examination without abnormal findings: Secondary | ICD-10-CM

## 2022-11-01 NOTE — Patient Instructions (Signed)
Gabrielle Salinas , Thank you for taking time to come for your Medicare Wellness Visit. I appreciate your ongoing commitment to your health goals. Please review the following plan we discussed and let me know if I can assist you in the future.   Screening recommendations/referrals: Colonoscopy: Cologuard 05/14/20 Mammogram: 07/19/22 Bone Density: 07/19/22 Recommended yearly ophthalmology/optometry visit for glaucoma screening and checkup Recommended yearly dental visit for hygiene and checkup  Vaccinations: Influenza vaccine: 09/01/22 Pneumococcal vaccine: 03/14/16 Tdap vaccine: 04/28/21 Shingles vaccine: Zostavax 07/30/14    Shingrix 08/22/19, 10/30/19   Covid-19:01/09/20, 02/02/20, 12/17/19, 10/08/21  Advanced directives: no  Conditions/risks identified: none  Next appointment: Follow up in one year for your annual wellness visit 11/05/23 @ 2:45 pm in person   Preventive Care 65 Years and Older, Female Preventive care refers to lifestyle choices and visits with your health care provider that can promote health and wellness. What does preventive care include? A yearly physical exam. This is also called an annual well check. Dental exams once or twice a year. Routine eye exams. Ask your health care provider how often you should have your eyes checked. Personal lifestyle choices, including: Daily care of your teeth and gums. Regular physical activity. Eating a healthy diet. Avoiding tobacco and drug use. Limiting alcohol use. Practicing safe sex. Taking low-dose aspirin every day. Taking vitamin and mineral supplements as recommended by your health care provider. What happens during an annual well check? The services and screenings done by your health care provider during your annual well check will depend on your age, overall health, lifestyle risk factors, and family history of disease. Counseling  Your health care provider may ask you questions about your: Alcohol use. Tobacco use. Drug  use. Emotional well-being. Home and relationship well-being. Sexual activity. Eating habits. History of falls. Memory and ability to understand (cognition). Work and work Astronomer. Reproductive health. Screening  You may have the following tests or measurements: Height, weight, and BMI. Blood pressure. Lipid and cholesterol levels. These may be checked every 5 years, or more frequently if you are over 79 years old. Skin check. Lung cancer screening. You may have this screening every year starting at age 61 if you have a 30-pack-year history of smoking and currently smoke or have quit within the past 15 years. Fecal occult blood test (FOBT) of the stool. You may have this test every year starting at age 34. Flexible sigmoidoscopy or colonoscopy. You may have a sigmoidoscopy every 5 years or a colonoscopy every 10 years starting at age 74. Hepatitis C blood test. Hepatitis B blood test. Sexually transmitted disease (STD) testing. Diabetes screening. This is done by checking your blood sugar (glucose) after you have not eaten for a while (fasting). You may have this done every 1-3 years. Bone density scan. This is done to screen for osteoporosis. You may have this done starting at age 55. Mammogram. This may be done every 1-2 years. Talk to your health care provider about how often you should have regular mammograms. Talk with your health care provider about your test results, treatment options, and if necessary, the need for more tests. Vaccines  Your health care provider may recommend certain vaccines, such as: Influenza vaccine. This is recommended every year. Tetanus, diphtheria, and acellular pertussis (Tdap, Td) vaccine. You may need a Td booster every 10 years. Zoster vaccine. You may need this after age 21. Pneumococcal 13-valent conjugate (PCV13) vaccine. One dose is recommended after age 70. Pneumococcal polysaccharide (PPSV23) vaccine. One dose is  recommended after age  8. Talk to your health care provider about which screenings and vaccines you need and how often you need them. This information is not intended to replace advice given to you by your health care provider. Make sure you discuss any questions you have with your health care provider. Document Released: 12/10/2015 Document Revised: 08/02/2016 Document Reviewed: 09/14/2015 Elsevier Interactive Patient Education  2017 [place] Prevention in the Home Falls can cause injuries. They can happen to people of all ages. There are many things you can do to make your home safe and to help prevent falls. What can I do on the outside of my home? Regularly fix the edges of walkways and driveways and fix any cracks. Remove anything that might make you trip as you walk through a door, such as a raised step or threshold. Trim any bushes or trees on the path to your home. Use bright outdoor lighting. Clear any walking paths of anything that might make someone trip, such as rocks or tools. Regularly check to see if handrails are loose or broken. Make sure that both sides of any steps have handrails. Any raised decks and porches should have guardrails on the edges. Have any leaves, snow, or ice cleared regularly. Use sand or salt on walking paths during winter. Clean up any spills in your garage right away. This includes oil or grease spills. What can I do in the bathroom? Use night lights. Install grab bars by the toilet and in the tub and shower. Do not use towel bars as grab bars. Use non-skid mats or decals in the tub or shower. If you need to sit down in the shower, use a plastic, non-slip stool. Keep the floor dry. Clean up any water that spills on the floor as soon as it happens. Remove soap buildup in the tub or shower regularly. Attach bath mats securely with double-sided non-slip rug tape. Do not have throw rugs and other things on the floor that can make you trip. What can I do in the  bedroom? Use night lights. Make sure that you have a light by your bed that is easy to reach. Do not use any sheets or blankets that are too big for your bed. They should not hang down onto the floor. Have a firm chair that has side arms. You can use this for support while you get dressed. Do not have throw rugs and other things on the floor that can make you trip. What can I do in the kitchen? Clean up any spills right away. Avoid walking on wet floors. Keep items that you use a lot in easy-to-reach places. If you need to reach something above you, use a strong step stool that has a grab bar. Keep electrical cords out of the way. Do not use floor polish or wax that makes floors slippery. If you must use wax, use non-skid floor wax. Do not have throw rugs and other things on the floor that can make you trip. What can I do with my stairs? Do not leave any items on the stairs. Make sure that there are handrails on both sides of the stairs and use them. Fix handrails that are broken or loose. Make sure that handrails are as long as the stairways. Check any carpeting to make sure that it is firmly attached to the stairs. Fix any carpet that is loose or worn. Avoid having throw rugs at the top or bottom of the stairs. If  you do have throw rugs, attach them to the floor with carpet tape. Make sure that you have a light switch at the top of the stairs and the bottom of the stairs. If you do not have them, ask someone to add them for you. What else can I do to help prevent falls? Wear shoes that: Do not have high heels. Have rubber bottoms. Are comfortable and fit you well. Are closed at the toe. Do not wear sandals. If you use a stepladder: Make sure that it is fully opened. Do not climb a closed stepladder. Make sure that both sides of the stepladder are locked into place. Ask someone to hold it for you, if possible. Clearly mark and make sure that you can see: Any grab bars or  handrails. First and last steps. Where the edge of each step is. Use tools that help you move around (mobility aids) if they are needed. These include: Canes. Walkers. Scooters. Crutches. Turn on the lights when you go into a dark area. Replace any light bulbs as soon as they burn out. Set up your furniture so you have a clear path. Avoid moving your furniture around. If any of your floors are uneven, fix them. If there are any pets around you, be aware of where they are. Review your medicines with your doctor. Some medicines can make you feel dizzy. This can increase your chance of falling. Ask your doctor what other things that you can do to help prevent falls. This information is not intended to replace advice given to you by your health care provider. Make sure you discuss any questions you have with your health care provider. Document Released: 09/09/2009 Document Revised: 04/20/2016 Document Reviewed: 12/18/2014 Elsevier Interactive Patient Education  2017 [employer].

## 2022-11-01 NOTE — Progress Notes (Signed)
Subjective:   Gabrielle Salinas is a 71 y.o. female who presents for Medicare Annual (Subsequent) preventive examination.  Review of Systems     Cardiac Risk Factors include: advanced age (>58men, >52 women);hypertension     Objective:    Today's Vitals   11/01/22 1412 11/01/22 1413  BP: (!) 140/80   Weight: 171 lb 14.4 oz (78 kg)   Height: 5\' 3"  (1.6 m)   PainSc:  5    Body mass index is 30.45 kg/m.     11/01/2022    2:37 PM 11/29/2021    8:00 AM 11/28/2021   10:25 PM 10/31/2021    3:18 PM 10/26/2020    2:55 PM 10/02/2019   10:53 AM 09/30/2018   11:50 AM  Advanced Directives  Does Patient Have a Medical Advance Directive? No No No No Yes Yes No  Type of 13/02/2018;Living will Healthcare Power of [place];Living will   Copy of Healthcare Power of Attorney in Chart?     No - copy requested No - copy requested   Would patient like information on creating a medical advance directive? No - Patient declined No - Patient declined  No - Patient declined   Yes (MAU/Ambulatory/Procedural Areas - Information given)    Current Medications (verified) Outpatient Encounter Medications as of 11/01/2022  Medication Sig   acetaminophen (TYLENOL) 325 MG tablet Take 650 mg by mouth every 4 (four) hours as needed for mild pain or fever.   alendronate (FOSAMAX) 70 MG tablet TAKE 1 TABLET BY MOUTH EVERY WEEK WITH A FULL GLASS OF WATER AND ON AN EMPTY STOMACH   ALPRAZolam (XANAX) 0.25 MG tablet TAKE 1 TABLET(0.25 MG) BY MOUTH DAILY AS NEEDED FOR ANXIETY   azelastine (ASTELIN) 0.1 % nasal spray USE 2 SPRAYS IN EACH NOSTRIL TWICE DAILY AS DIRECTED   Calcium Carb-Cholecalciferol 600-500 MG-UNIT CAPS Take 600 Units by mouth daily.   clorazepate (TRANXENE) 15 MG tablet TAKE 1/2 TABLET BY MOUTH THREE TIMES DAILY AS NEEDED   esomeprazole (NEXIUM) 40 MG capsule TAKE 1 CAPSULE BY MOUTH EVERY DAY AT NOON   ezetimibe (ZETIA) 10 MG tablet TAKE 1 TABLET(10 MG) BY MOUTH  DAILY   FLUoxetine (PROZAC) 20 MG capsule TAKE 1 CAPSULE(20 MG) BY MOUTH DAILY   hydrochlorothiazide (HYDRODIURIL) 12.5 MG tablet TAKE 1 TABLET(12.5 MG) BY MOUTH DAILY   lisinopril (ZESTRIL) 10 MG tablet TAKE 1 TABLET(10 MG) BY MOUTH DAILY   Misc Natural Products (JOINT SUPPORT) CAPS Take 1 capsule by mouth daily.   montelukast (SINGULAIR) 10 MG tablet TAKE 1 TABLET BY MOUTH EVERY DAY FOR ALLERGIES   Multiple Vitamins-Minerals (PRESERVISION AREDS 2 PO) Take 1 tablet by mouth 2 (two) times daily.   Omega-3 Fatty Acids (FISH OIL) 1000 MG CAPS Take 1 capsule by mouth daily.    triamcinolone cream (KENALOG) 0.5 % APPLY TOPICALLY TO THE AFFECTED AREA DAILY AS NEEDED   Turmeric 500 MG CAPS Take 1 capsule by mouth daily.   vitamin C (ASCORBIC ACID) 500 MG tablet Take 500 mg by mouth daily.   vitamin E 1000 UNIT capsule Take 1,000 Units by mouth daily.    [DISCONTINUED] Multiple Vitamins-Minerals (MULTIVITAMIN ADULT PO) Take 1 tablet by mouth daily.   No facility-administered encounter medications on file as of 11/01/2022.    Allergies (verified) Bactrim [sulfamethoxazole-trimethoprim], Bee venom, Daypro  [oxaprozin], Sulfa antibiotics, and Sulfasalazine   History: Past Medical History:  Diagnosis Date   Allergy  Anxiety    Cataract    COVID-19 virus infection 11/29/2021   Depression    GERD (gastroesophageal reflux disease)    Hyperlipidemia    Hypertension    Osteoporosis    Past Surgical History:  Procedure Laterality Date   BREAST BIOPSY Right <10 yrs ago   x2 benign results   CATARACT EXTRACTION, BILATERAL  09/2019   COLONOSCOPY  11/19/2017   GI Bleed  2008   RHINOPLASTY     TOTAL HIP ARTHROPLASTY Right 11/29/2021   Procedure: TOTAL HIP ARTHROPLASTY ANTERIOR APPROACH;  Surgeon: Kennedy Bucker, MD;  Location: [facility] ORS;  Service: Orthopedics;  Laterality: Right;   UPPER GASTROINTESTINAL ENDOSCOPY  10/2017   [facility]   VAGINAL HYSTERECTOMY  1982   due to abnormal pap smears and  menorrhagia   Family History  Problem Relation Age of Onset   Lupus Mother    Heart attack Father    Diabetes Father    Diabetes Sister    Diabetes Sister    Diabetes Sister    Breast cancer Neg Hx    Social History   Socioeconomic History   Marital status: Divorced    Spouse name: Not on file   Number of children: 3   Years of education: Not on file   Highest education level: Associate degree: academic program  Occupational History   Occupation: retired    Comment: keeps granddaughter  Tobacco Use   Smoking status: Never   Smokeless tobacco: Never  Vaping Use   Vaping Use: Never used  Substance and Sexual Activity   Alcohol use: Yes    Alcohol/week: 0.0 standard drinks of alcohol    Comment: 1glass of wine on special occasions   Drug use: Never   Sexual activity: Not on file  Other Topics Concern   Not on file  Social History Narrative   Not on file   Social Determinants of Health   Financial Resource Strain: Low Risk  (11/01/2022)   Overall Financial Resource Strain (CARDIA)    Difficulty of Paying Living Expenses: Not hard at all  Food Insecurity: No Food Insecurity (11/01/2022)   Hunger Vital Sign    Worried About Running Out of Food in the Last Year: Never true    Ran Out of Food in the Last Year: Never true  Transportation Needs: No Transportation Needs (11/01/2022)   PRAPARE - Administrator, Civil Service (Medical): No    Lack of Transportation (Non-Medical): No  Physical Activity: Insufficiently Active (11/01/2022)   Exercise Vital Sign    Days of Exercise per Week: 3 days    Minutes of Exercise per Session: 30 min  Stress: No Stress Concern Present (11/01/2022)   [employer] of Occupational Health - Occupational Stress Questionnaire    Feeling of Stress : Only a little  Social Connections: Moderately Isolated (11/01/2022)   Social Connection and Isolation Panel [NHANES]    Frequency of Communication with Friends and Family: More than  three times a week    Frequency of Social Gatherings with Friends and Family: Twice a week    Attends Religious Services: More than 4 times per year    Active Member of [employer] or Organizations: No    Attends Banker Meetings: Never    Marital Status: Widowed    Tobacco Counseling Counseling given: Not Answered   Clinical Intake:  Pre-visit preparation completed: Yes  Pain : 0-10 Pain Score: 5  Pain Type: Chronic pain Pain Location: Back Pain Relieving  Factors: Insta Flex, acetaminophen  Pain Relieving Factors: Insta Flex, acetaminophen  Nutritional Risks: None Diabetes: No  How often do you need to have someone help you when you read instructions, pamphlets, or other written materials from your doctor or pharmacy?: 1 - Never  Diabetic?no  Interpreter Needed?: No  Information entered by :: Kennedy Bucker, LPN   Activities of Daily Living    11/01/2022    2:43 PM 11/01/2022    2:42 PM  In your present state of health, do you have any difficulty performing the following activities:  Hearing? 0 0  Vision? 0 0  Difficulty concentrating or making decisions? 0 0  Walking or climbing stairs? 0 0  Dressing or bathing? 0 0  Doing errands, shopping? 0 0  Preparing Food and eating ? N N  Using the Toilet? N N  In the past six months, have you accidently leaked urine? N N  Do you have problems with loss of bowel control? N N  Managing your Medications? N N  Managing your Finances? N N  Housekeeping or managing your Housekeeping? N N    Patient Care Team: Malva Limes, MD as PCP - General (Family Medicine) Patty, A. Azucena Kuba, MD as Consulting Physician (Ophthalmology) Juanell Fairly, MD as Referring Physician (Orthopedic Surgery) Elwin Mocha, MD as Consulting Physician (Ophthalmology)  Indicate any recent Medical Services you may have received from other than [facility] providers in the past year (date may be approximate).     Assessment:   This  is a routine wellness examination for Caci.  Hearing/Vision screen Hearing Screening - Comments:: No aids Vision Screening - Comments:: Readers- [facility]  Dietary issues and exercise activities discussed: Current Exercise Habits: Home exercise routine, Type of exercise: walking, Time (Minutes): 30, Frequency (Times/Week): 3, Weekly Exercise (Minutes/Week): 90, Intensity: Mild   Goals Addressed             This Visit's Progress    DIET - EAT MORE FRUITS AND VEGETABLES         Depression Screen    11/01/2022    2:36 PM 09/01/2022   10:48 AM 05/09/2022   10:08 AM 04/28/2022    9:20 AM 10/31/2021    3:12 PM 09/09/2021    1:37 PM 04/26/2021    2:36 PM  PHQ 2/9 Scores  PHQ - 2 Score 1 0 0 1 1 2  0  PHQ- 9 Score  3 1 3 1 6  0    Fall Risk    11/01/2022    2:41 PM 09/01/2022   10:48 AM 05/09/2022   10:07 AM 04/28/2022    9:11 AM 10/31/2021    3:20 PM  Fall Risk   Falls in the past year? 1 1 1 1 1   Number falls in past yr: 0 0 0 1 0  Injury with Fall? 1 1 1 1 1   Risk for fall due to : History of fall(s);Orthopedic patient History of fall(s)  History of fall(s) History of fall(s)  Follow up Falls prevention discussed;Falls evaluation completed Falls prevention discussed  Falls prevention discussed Falls prevention discussed    FALL RISK PREVENTION PERTAINING TO THE HOME:  Any stairs in or around the home? Yes  If so, are there any without handrails? No  Home free of loose throw rugs in walkways, pet beds, electrical cords, etc? Yes  Adequate lighting in your home to reduce risk of falls? Yes   ASSISTIVE DEVICES UTILIZED TO PREVENT FALLS:  Life alert? No  Use of a cane, walker or w/c? No  Grab bars in the bathroom? No  Shower chair or bench in shower? No  Elevated toilet seat or a handicapped toilet? No   TIMED UP AND GO:  Was the test performed? Yes .  Length of time to ambulate 10 feet: 4 sec.   Gait steady and fast without use of assistive device  Cognitive  Function:        11/01/2022    2:45 PM 09/28/2017    1:49 PM  6CIT Screen  What Year? 0 points 0 points  What month? 0 points 0 points  What time? 0 points 0 points  Count back from 20 0 points 0 points  Months in reverse 0 points 0 points  Repeat phrase 0 points 2 points  Total Score 0 points 2 points    Immunizations Immunization History  Administered Date(s) Administered   Fluad Quad(high Dose 65+) 08/03/2020, 09/09/2021, 09/01/2022   Influenza, High Dose Seasonal PF 09/02/2016, 09/28/2017, 09/30/2018, 08/22/2019   PFIZER(Purple Top)SARS-COV-2 Vaccination 01/09/2020, 02/02/2020, 09/15/2020   Pfizer Covid-19 Vaccine Bivalent Booster 50yrs & up 10/08/2021   Pneumococcal Conjugate-13 07/30/2014   Pneumococcal Polysaccharide-23 10/22/1998, 03/14/2016   Respiratory Syncytial Virus Vaccine,Recomb Aduvanted(Arexvy) 10/09/2022   Td 07/23/1995   Tdap 09/29/2011, 04/28/2021   Zoster Recombinat (Shingrix) 08/22/2019, 10/30/2019   Zoster, Live 07/30/2014    TDAP status: Up to date  Flu Vaccine status: Up to date  Pneumococcal vaccine status: Up to date  Covid-19 vaccine status: Completed vaccines  Qualifies for Shingles Vaccine? Yes   Zostavax completed Yes   Shingrix Completed?: Yes  Screening Tests Health Maintenance  Topic Date Due   COVID-19 Vaccine (5 - 2023-24 season) 07/28/2022   Fecal DNA (Cologuard)  05/12/2023   MAMMOGRAM  07/20/2023   Medicare Annual Wellness (AWV)  11/02/2023   DEXA SCAN  07/19/2024   DTaP/Tdap/Td (4 - Td or Tdap) 04/29/2031   Pneumonia Vaccine 34+ Years old  Completed   INFLUENZA VACCINE  Completed   Hepatitis C Screening  Completed   Zoster Vaccines- Shingrix  Completed   HPV VACCINES  Aged Out   COLONOSCOPY (Pts 45-88yrs Insurance coverage will need to be confirmed)  Discontinued    Health Maintenance  Health Maintenance Due  Topic Date Due   COVID-19 Vaccine (5 - 2023-24 season) 07/28/2022    Colorectal cancer screening: Type  of screening: Cologuard. Completed 05/14/20. Repeat every 3 years  Mammogram status: Completed 07/19/22. Repeat every year  Bone Density status: Completed 07/19/22. Results reflect: Bone density results: OSTEOPOROSIS. Repeat every 2 years.  Lung Cancer Screening: (Low Dose CT Chest recommended if Age 81-80 years, 30 pack-year currently smoking OR have quit w/in 15years.) does not qualify.   Additional Screening:  Hepatitis C Screening: does qualify; Completed 07/30/14  Vision Screening: Recommended annual ophthalmology exams for early detection of glaucoma and other disorders of the eye. Is the patient up to date with their annual eye exam?  Yes  Who is the provider or what is the name of the office in which the patient attends annual eye exams? [facility] If pt is not established with a provider, would they like to be referred to a provider to establish care? No .   Dental Screening: Recommended annual dental exams for proper oral hygiene  Community Resource Referral / Chronic Care Management: CRR required this visit?  No   CCM required this visit?  No      Plan:  I have personally reviewed and noted the following in the patient's chart:   Medical and social history Use of alcohol, tobacco or illicit drugs  Current medications and supplements including opioid prescriptions. Patient is not currently taking opioid prescriptions. Functional ability and status Nutritional status Physical activity Advanced directives List of other physicians Hospitalizations, surgeries, and ER visits in previous 12 months Vitals Screenings to include cognitive, depression, and falls Referrals and appointments  In addition, I have reviewed and discussed with patient certain preventive protocols, quality metrics, and best practice recommendations. A written personalized care plan for preventive services as well as general preventive health recommendations were provided to patient.     Hal HopeLorrie  S Aidin Doane, LPN   16/1/096012/04/2022   Nurse Notes: none

## 2022-11-07 ENCOUNTER — Other Ambulatory Visit: Payer: Self-pay | Admitting: Family Medicine

## 2022-11-07 DIAGNOSIS — E669 Obesity, unspecified: Secondary | ICD-10-CM

## 2022-11-08 NOTE — Telephone Encounter (Signed)
Phentermine dc'd 09/01/22 by Dr. Sherrie Mustache (no longer needed as a prn med)  NT not delegated to refuse or reorder this med. Routing to office.

## 2022-11-18 ENCOUNTER — Other Ambulatory Visit: Payer: Self-pay | Admitting: Family Medicine

## 2022-11-18 DIAGNOSIS — E669 Obesity, unspecified: Secondary | ICD-10-CM

## 2022-11-21 NOTE — Telephone Encounter (Signed)
Requested medication (s) are due for refill today: no  Requested medication (s) are on the active medication list: no  Last refill:  Medication discontinued 08/2022  Future visit scheduled: yes   Notes to clinic:  Please review for refill. Medication not on current med list. Refill not delegated protocol    Requested Prescriptions  Pending Prescriptions Disp Refills   phentermine 15 MG capsule [Pharmacy Med Name: PHENTERMINE HCL 15MG CAPSULES] 30 capsule     Sig: TAKE 1 CAPSULE(15 MG) BY MOUTH EVERY MORNING     Not Delegated - Neurology: Anticonvulsants - Controlled - phentermine hydrochloride Failed - 11/18/2022  4:36 PM      Failed - This refill cannot be delegated      Failed - Last BP in normal range    BP Readings from Last 1 Encounters:  11/01/22 (!) 140/80         Passed - eGFR in normal range and within 360 days    GFR calc Af Amer  Date Value Ref Range Status  02/26/2020 95 >59 mL/min/1.73 Final   GFR, Estimated  Date Value Ref Range Status  12/01/2021 >60 >60 mL/min Final    Comment:    (NOTE) Calculated using the CKD-EPI Creatinine Equation (2021)    eGFR  Date Value Ref Range Status  04/28/2022 82 >59 mL/min/1.73 Final         Passed - Cr in normal range and within 360 days    Creatinine, Ser  Date Value Ref Range Status  04/28/2022 0.77 0.57 - 1.00 mg/dL Final         Passed - Valid encounter within last 6 months    Recent Outpatient Visits           2 months ago Prediabetes   [facility] Birdie Sons, MD   6 months ago Depressive disorder   [employer], Erin E, PA-C   6 months ago Osteoporosis, unspecified osteoporosis type, unspecified pathological fracture presence   [facility] Birdie Sons, MD   1 year ago Viral URI with cough   [employer], Erin E, PA-C   1 year ago Obesity (BMI 30-39.9)   [place], MD       Future  Appointments             In 2 months Fisher, Kirstie Peri, MD [employer], [place] - Weight completed in the last 3 months    Wt Readings from Last 1 Encounters:  11/01/22 171 lb 14.4 oz (78 kg)

## 2022-12-19 ENCOUNTER — Other Ambulatory Visit: Payer: Self-pay | Admitting: Family Medicine

## 2022-12-28 ENCOUNTER — Telehealth: Payer: 59 | Admitting: Nurse Practitioner

## 2022-12-28 DIAGNOSIS — J020 Streptococcal pharyngitis: Secondary | ICD-10-CM | POA: Diagnosis not present

## 2022-12-28 MED ORDER — AMOXICILLIN 500 MG PO CAPS: 500.0000 mg | ORAL_CAPSULE | Freq: Two times a day (BID) | ORAL | 0 refills | Status: AC

## 2022-12-28 NOTE — Patient Instructions (Signed)
Gabrielle Salinas, thank you for joining Gabrielle Pounds, NP for today's virtual visit.  While this provider is not your primary care provider (PCP), if your PCP is located in our provider database this encounter information will be shared with them immediately following your visit.   [facility] account gives you access to today's visit and all your visits, tests, and labs performed at [facility] " click here if you don't have a [facility] account or go to [email or web address]  Consent: (Patient) Gabrielle Salinas provided verbal consent for this virtual visit at the beginning of the encounter.  Current Medications:  Current Outpatient Medications:    amoxicillin (AMOXIL) 500 MG capsule, Take 1 capsule (500 mg total) by mouth 2 (two) times daily for 10 days., Disp: 20 capsule, Rfl: 0   acetaminophen (TYLENOL) 325 MG tablet, Take 650 mg by mouth every 4 (four) hours as needed for mild pain or fever., Disp: , Rfl:    alendronate (FOSAMAX) 70 MG tablet, TAKE 1 TABLET BY MOUTH EVERY WEEK WITH A FULL GLASS OF WATER AND ON AN EMPTY STOMACH, Disp: 12 tablet, Rfl: 4   ALPRAZolam (XANAX) 0.25 MG tablet, TAKE 1 TABLET(0.25 MG) BY MOUTH DAILY AS NEEDED FOR ANXIETY, Disp: 30 tablet, Rfl: 3   azelastine (ASTELIN) 0.1 % nasal spray, USE 2 SPRAYS IN EACH NOSTRIL TWICE DAILY AS DIRECTED, Disp: 30 mL, Rfl: 3   Calcium Carb-Cholecalciferol 600-500 MG-UNIT CAPS, Take 600 Units by mouth daily., Disp: , Rfl:    clorazepate (TRANXENE) 15 MG tablet, TAKE 1/2 TABLET BY MOUTH THREE TIMES DAILY AS NEEDED, Disp: 30 tablet, Rfl: 5   esomeprazole (NEXIUM) 40 MG capsule, TAKE 1 CAPSULE BY MOUTH EVERY DAY AT NOON, Disp: 90 capsule, Rfl: 4   ezetimibe (ZETIA) 10 MG tablet, TAKE 1 TABLET(10 MG) BY MOUTH DAILY, Disp: 90 tablet, Rfl: 3   FLUoxetine (PROZAC) 20 MG capsule, TAKE 1 CAPSULE(20 MG) BY MOUTH DAILY, Disp: 90 capsule, Rfl: 4   hydrochlorothiazide (HYDRODIURIL) 12.5 MG tablet, TAKE 1  TABLET(12.5 MG) BY MOUTH DAILY, Disp: 90 tablet, Rfl: 4   lisinopril (ZESTRIL) 10 MG tablet, TAKE 1 TABLET(10 MG) BY MOUTH DAILY, Disp: 90 tablet, Rfl: 0   Misc Natural Products (JOINT SUPPORT) CAPS, Take 1 capsule by mouth daily., Disp: , Rfl:    montelukast (SINGULAIR) 10 MG tablet, TAKE 1 TABLET BY MOUTH EVERY DAY FOR ALLERGIES, Disp: 90 tablet, Rfl: 2   Multiple Vitamins-Minerals (PRESERVISION AREDS 2 PO), Take 1 tablet by mouth 2 (two) times daily., Disp: , Rfl:    Omega-3 Fatty Acids (FISH OIL) 1000 MG CAPS, Take 1 capsule by mouth daily. , Disp: , Rfl:    phentermine 15 MG capsule, TAKE 1 CAPSULE(15 MG) BY MOUTH EVERY MORNING, Disp: 30 capsule, Rfl: 1   triamcinolone cream (KENALOG) 0.5 %, APPLY TOPICALLY TO THE AFFECTED AREA DAILY AS NEEDED, Disp: 30 g, Rfl: 1   Turmeric 500 MG CAPS, Take 1 capsule by mouth daily., Disp: , Rfl:    vitamin C (ASCORBIC ACID) 500 MG tablet, Take 500 mg by mouth daily., Disp: , Rfl:    vitamin E 1000 UNIT capsule, Take 1,000 Units by mouth daily. , Disp: , Rfl:    Medications ordered in this encounter:  Meds ordered this encounter  Medications   amoxicillin (AMOXIL) 500 MG capsule    Sig: Take 1 capsule (500 mg total) by mouth 2 (two) times daily for 10 days.    Dispense:  20 capsule    Refill:  0    Order Specific Question:   Supervising Provider    Answer:   Chase Picket [[id number]]     *If you need refills on other medications prior to your next appointment, please contact your pharmacy*  Follow-Up: Call back or seek an in-person evaluation if the symptoms worsen or if the condition fails to improve as anticipated.  Gabrielle Salinas [phone]  Other Instructions May alternate with liquid tylenol and motrin for pain relief   If you have been instructed to have an in-person evaluation today at a local Urgent Care facility, please use the link below. It will take you to a list of all of our available [facility],  including address, phone number and hours of operation. Please do not delay care.  [facility]  If you or a family member do not have a primary care provider, use the link below to schedule a visit and establish care. When you choose a [facility] primary care physician or advanced practice provider, you gain a long-term partner in health. Find a Primary Care Provider  Learn more about 's in-office and virtual care options: [facility]

## 2022-12-28 NOTE — Progress Notes (Signed)
Virtual Visit Consent   Gabrielle Salinas, you are scheduled for a virtual visit with a [facility] provider today. Just as with appointments in the office, your consent must be obtained to participate. Your consent will be active for this visit and any virtual visit you may have with one of our providers in the next 365 days. If you have a MyChart account, a copy of this consent can be sent to you electronically.  As this is a virtual visit, video technology does not allow for your provider to perform a traditional examination. This may limit your provider's ability to fully assess your condition. If your provider identifies any concerns that need to be evaluated in person or the need to arrange testing (such as labs, EKG, etc.), we will make arrangements to do so. Although advances in technology are sophisticated, we cannot ensure that it will always work on either your end or our end. If the connection with a video visit is poor, the visit may have to be switched to a telephone visit. With either a video or telephone visit, we are not always able to ensure that we have a secure connection.  By engaging in this virtual visit, you consent to the provision of healthcare and authorize for your insurance to be billed (if applicable) for the services provided during this visit. Depending on your insurance coverage, you may receive a charge related to this service.  I need to obtain your verbal consent now. Are you willing to proceed with your visit today? Prisila Dlouhy has provided verbal consent on 12/28/2022 for a virtual visit (video or telephone). [name], NP  Date: 12/28/2022 5:53 PM  Virtual Visit via Video Note   I, [name], connected with  Gabrielle Salinas  ([id number], 1951-10-03) on 12/28/22 at  6:00 PM EST by a video-enabled telemedicine application and verified that I am speaking with the correct person using two identifiers.  Location: Patient: Virtual Visit Location  Patient: Home Provider: Virtual Visit Location Provider: Home Office   I discussed the limitations of evaluation and management by telemedicine and the availability of in person appointments. The patient expressed understanding and agreed to proceed.    History of Present Illness: Gabrielle Salinas is a 72 y.o. who identifies as a female who was assigned female at birth, and is being seen today for strep pharyngitis  Sore Throat: Patient complains of sore throat. Associated symptoms include dry cough, enlarged tonsils, hoarseness, pain while swallowing, sore throat, and swollen glands.Onset of symptoms was a few days ago, gradually worsening since that time. She is drinking plenty of fluids. She has had recent close exposure to someone with proven streptococcal pharyngitis.    Problems:  Patient Active Problem List   Diagnosis Date Noted   Closed right hip fracture ([place]) 11/29/2021   Macular degeneration, age related 08/03/2020   Insomnia 11/08/2018   Osteoporosis 04/20/2016   GERD (gastroesophageal reflux disease) 03/07/2016   Anxiety state 03/07/2016   Arthralgia 03/07/2016   Right knee pain 03/07/2016   Menopausal symptom 03/07/2016   Contact dermatitis 03/07/2016   Hemorrhage of gastrointestinal tract 10/17/2007   Cervicalgia 10/17/2007   HLD (hyperlipidemia) 03/21/2007   Prediabetes 03/21/2007   Allergic rhinitis due to pollen 11/27/2001   Depressive disorder 11/27/2001   Hypertension 11/27/1997    Allergies:  Allergies  Allergen Reactions   Bactrim [Sulfamethoxazole-Trimethoprim] Itching   Bee Venom Swelling   Daypro  [Oxaprozin]    Sulfa Antibiotics Diarrhea  Sulfasalazine    Medications:  Current Outpatient Medications:    amoxicillin (AMOXIL) 500 MG capsule, Take 1 capsule (500 mg total) by mouth 2 (two) times daily for 10 days., Disp: 20 capsule, Rfl: 0   acetaminophen (TYLENOL) 325 MG tablet, Take 650 mg by mouth every 4 (four) hours as needed for mild pain or  fever., Disp: , Rfl:    alendronate (FOSAMAX) 70 MG tablet, TAKE 1 TABLET BY MOUTH EVERY WEEK WITH A FULL GLASS OF WATER AND ON AN EMPTY STOMACH, Disp: 12 tablet, Rfl: 4   ALPRAZolam (XANAX) 0.25 MG tablet, TAKE 1 TABLET(0.25 MG) BY MOUTH DAILY AS NEEDED FOR ANXIETY, Disp: 30 tablet, Rfl: 3   azelastine (ASTELIN) 0.1 % nasal spray, USE 2 SPRAYS IN EACH NOSTRIL TWICE DAILY AS DIRECTED, Disp: 30 mL, Rfl: 3   Calcium Carb-Cholecalciferol 600-500 MG-UNIT CAPS, Take 600 Units by mouth daily., Disp: , Rfl:    clorazepate (TRANXENE) 15 MG tablet, TAKE 1/2 TABLET BY MOUTH THREE TIMES DAILY AS NEEDED, Disp: 30 tablet, Rfl: 5   esomeprazole (NEXIUM) 40 MG capsule, TAKE 1 CAPSULE BY MOUTH EVERY DAY AT NOON, Disp: 90 capsule, Rfl: 4   ezetimibe (ZETIA) 10 MG tablet, TAKE 1 TABLET(10 MG) BY MOUTH DAILY, Disp: 90 tablet, Rfl: 3   FLUoxetine (PROZAC) 20 MG capsule, TAKE 1 CAPSULE(20 MG) BY MOUTH DAILY, Disp: 90 capsule, Rfl: 4   hydrochlorothiazide (HYDRODIURIL) 12.5 MG tablet, TAKE 1 TABLET(12.5 MG) BY MOUTH DAILY, Disp: 90 tablet, Rfl: 4   lisinopril (ZESTRIL) 10 MG tablet, TAKE 1 TABLET(10 MG) BY MOUTH DAILY, Disp: 90 tablet, Rfl: 0   Misc Natural Products (JOINT SUPPORT) CAPS, Take 1 capsule by mouth daily., Disp: , Rfl:    montelukast (SINGULAIR) 10 MG tablet, TAKE 1 TABLET BY MOUTH EVERY DAY FOR ALLERGIES, Disp: 90 tablet, Rfl: 2   Multiple Vitamins-Minerals (PRESERVISION AREDS 2 PO), Take 1 tablet by mouth 2 (two) times daily., Disp: , Rfl:    Omega-3 Fatty Acids (FISH OIL) 1000 MG CAPS, Take 1 capsule by mouth daily. , Disp: , Rfl:    phentermine 15 MG capsule, TAKE 1 CAPSULE(15 MG) BY MOUTH EVERY MORNING, Disp: 30 capsule, Rfl: 1   triamcinolone cream (KENALOG) 0.5 %, APPLY TOPICALLY TO THE AFFECTED AREA DAILY AS NEEDED, Disp: 30 g, Rfl: 1   Turmeric 500 MG CAPS, Take 1 capsule by mouth daily., Disp: , Rfl:    vitamin C (ASCORBIC ACID) 500 MG tablet, Take 500 mg by mouth daily., Disp: , Rfl:    vitamin  E 1000 UNIT capsule, Take 1,000 Units by mouth daily. , Disp: , Rfl:   Observations/Objective: Patient is well-developed, well-nourished in no acute distress.  Resting comfortably at home.  Head is normocephalic, atraumatic.  No labored breathing.  Speech is clear and coherent with logical content. Vocal hoarseness is present Patient is alert and oriented at baseline.    Assessment and Plan: 1. Strep pharyngitis - amoxicillin (AMOXIL) 500 MG capsule; Take 1 capsule (500 mg total) by mouth 2 (two) times daily for 10 days.  Dispense: 20 capsule; Refill: 0 May alternate with liquid tylenol and motrin for pain relief.   Follow Up Instructions: I discussed the assessment and treatment plan with the patient. The patient was provided an opportunity to ask questions and all were answered. The patient agreed with the plan and demonstrated an understanding of the instructions.  A copy of instructions were sent to the patient via MyChart unless otherwise noted below.  The patient was advised to call back or seek an in-person evaluation if the symptoms worsen or if the condition fails to improve as anticipated.  Time:  I spent 11 minutes with the patient via telehealth technology discussing the above problems/concerns.    [name], NP

## 2023-01-11 ENCOUNTER — Telehealth: Payer: Self-pay | Admitting: Family Medicine

## 2023-01-16 ENCOUNTER — Other Ambulatory Visit: Payer: Self-pay | Admitting: Family Medicine

## 2023-02-02 ENCOUNTER — Ambulatory Visit: Payer: Medicare Other | Admitting: Family Medicine

## 2023-02-08 ENCOUNTER — Other Ambulatory Visit: Payer: Self-pay | Admitting: Family Medicine

## 2023-02-09 NOTE — Telephone Encounter (Signed)
Requested medication (s) are due for refill today:   Provider to review  Requested medication (s) are on the active medication list:   Yes  Future visit scheduled:   No   Last ordered: 09/12/2022 #30, 5 refills  Non delegated refill   Requested Prescriptions  Pending Prescriptions Disp Refills   clorazepate (TRANXENE) 15 MG tablet [Pharmacy Med Name: CLORAZEPATE 15MG  TABLETS] 30 tablet     Sig: TAKE 1/2 TABLET BY MOUTH THREE TIMES DAILY AS NEEDED     Not Delegated - Psychiatry:  Anxiolytics/Hypnotics - clorazepate dipotassium Failed - 02/08/2023  7:25 PM      Failed - This refill cannot be delegated      Failed - AST in normal range and within 360 days    AST  Date Value Ref Range Status  11/30/2021 31 15 - 41 U/L Final         Failed - ALT in normal range and within 360 days    ALT  Date Value Ref Range Status  11/30/2021 28 0 - 44 U/L Final         Failed - Urine Drug Screen completed in last 360 days      Passed - WBC in normal range and within 360 days    WBC  Date Value Ref Range Status  04/28/2022 6.7 3.4 - 10.8 x10E3/uL Final  12/04/2021 9.9 4.0 - 10.5 K/uL Final         Passed - PLT in normal range and within 360 days    Platelets  Date Value Ref Range Status  04/28/2022 293 150 - 450 x10E3/uL Final         Passed - HGB in normal range and within 360 days    Hemoglobin  Date Value Ref Range Status  04/28/2022 13.3 11.1 - 15.9 g/dL Final         Passed - HCT in normal range and within 360 days    Hematocrit  Date Value Ref Range Status  04/28/2022 39.8 34.0 - 46.6 % Final         Passed - Cr in normal range and within 360 days    Creatinine, Ser  Date Value Ref Range Status  04/28/2022 0.77 0.57 - 1.00 mg/dL Final         Passed - Valid encounter within last 6 months    Recent Outpatient Visits           5 months ago Prediabetes   [place], Donald E, MD   9 months ago Depressive disorder   [place], Dani Gobble, PA-C   9 months ago Osteoporosis, unspecified osteoporosis type, unspecified pathological fracture presence   [place] Birdie Sons, MD   1 year ago Viral URI with cough   [place], Erin E, PA-C   1 year ago Obesity (BMI 30-39.9)   [place] [facility] Caryn Section, Kirstie Peri, MD

## 2023-03-05 ENCOUNTER — Other Ambulatory Visit: Payer: Self-pay | Admitting: Family Medicine

## 2023-03-06 NOTE — Telephone Encounter (Signed)
Requested Prescriptions  Pending Prescriptions Disp Refills   montelukast (SINGULAIR) 10 MG tablet [Pharmacy Med Name: MONTELUKAST 10MG  TABLETS] 90 tablet 1    Sig: TAKE 1 TABLET BY MOUTH EVERY DAY FOR ALLERGIES     Pulmonology:  Leukotriene Inhibitors Passed - 03/05/2023  6:20 AM      Passed - Valid encounter within last 12 months    Recent Outpatient Visits           6 months ago Prediabetes   [facility] Malva Limes, MD   10 months ago Depressive disorder   [place] [facility] Mecum, Erin E, PA-C   10 months ago Osteoporosis, unspecified osteoporosis type, unspecified pathological fracture presence   [place] [facility] Malva Limes, MD   1 year ago Viral URI with cough   [place] [facility] Mecum, Erin E, PA-C   1 year ago Obesity (BMI 30-39.9)   [place] [facility] Malva Limes, MD               esomeprazole (NEXIUM) 40 MG capsule [Pharmacy Med Name: ESOMEPRAZOLE MAGNESIUM 40MG  DR CAPS] 90 capsule 1    Sig: TAKE 1 CAPSULE BY MOUTH EVERY DAY AT NOON     Gastroenterology: Proton Pump Inhibitors 2 Failed - 03/05/2023  6:20 AM      Failed - ALT in normal range and within 360 days    ALT  Date Value Ref Range Status  11/30/2021 28 0 - 44 U/L Final         Failed - AST in normal range and within 360 days    AST  Date Value Ref Range Status  11/30/2021 31 15 - 41 U/L Final         Passed - Valid encounter within last 12 months    Recent Outpatient Visits           6 months ago Prediabetes   [facility] Malva Limes, MD   10 months ago Depressive disorder   [place] [facility] Mecum, Erin E, PA-C   10 months ago Osteoporosis, unspecified osteoporosis type, unspecified pathological fracture presence   [place] [facility] Malva Limes, MD   1 year ago Viral URI with cough   [facility] Mecum, Erin E, PA-C   1 year ago Obesity (BMI 30-39.9)   [place] [facility] Sherrie Mustache, Demetrios Isaacs, MD               lisinopril (ZESTRIL) 10 MG tablet [Pharmacy Med Name: LISINOPRIL 10MG  TABLETS] 90 tablet 0    Sig: TAKE 1 TABLET(10 MG) BY MOUTH DAILY     Cardiovascular:  ACE Inhibitors Failed - 03/05/2023  6:20 AM      Failed - Cr in normal range and within 180 days    Creatinine, Ser  Date Value Ref Range Status  04/28/2022 0.77 0.57 - 1.00 mg/dL Final         Failed - K in normal range and within 180 days    Potassium  Date Value Ref Range Status  04/28/2022 3.6 3.5 - 5.2 mmol/L Final         Failed - Last BP in normal range    BP Readings from Last 1 Encounters:  11/01/22 (!) 140/80         Passed - Patient is not pregnant  Passed - Valid encounter within last 6 months    Recent Outpatient Visits           6 months ago Prediabetes   [place] [facility] Malva Limes, MD   10 months ago Depressive disorder   [place] [facility] Mecum, Oswaldo Conroy, PA-C   10 months ago Osteoporosis, unspecified osteoporosis type, unspecified pathological fracture presence   [place] [facility] Malva Limes, MD   1 year ago Viral URI with cough   [place] [facility] Mecum, Oswaldo Conroy, PA-C   1 year ago Obesity (BMI 30-39.9)   [place] [facility] Sherrie Mustache, Demetrios Isaacs, MD

## 2023-03-28 ENCOUNTER — Other Ambulatory Visit: Payer: Self-pay | Admitting: Family Medicine

## 2023-04-20 ENCOUNTER — Other Ambulatory Visit: Payer: Self-pay | Admitting: Family Medicine

## 2023-05-02 ENCOUNTER — Other Ambulatory Visit: Payer: Self-pay | Admitting: Family Medicine

## 2023-05-08 ENCOUNTER — Other Ambulatory Visit: Payer: Self-pay | Admitting: Family Medicine

## 2023-05-08 DIAGNOSIS — W57XXXA Bitten or stung by nonvenomous insect and other nonvenomous arthropods, initial encounter: Secondary | ICD-10-CM

## 2023-05-30 DIAGNOSIS — S52502A Unspecified fracture of the lower end of left radius, initial encounter for closed fracture: Secondary | ICD-10-CM | POA: Diagnosis not present

## 2023-05-30 DIAGNOSIS — S8002XA Contusion of left knee, initial encounter: Secondary | ICD-10-CM | POA: Diagnosis not present

## 2023-05-30 DIAGNOSIS — M13862 Other specified arthritis, left knee: Secondary | ICD-10-CM | POA: Diagnosis not present

## 2023-06-06 ENCOUNTER — Telehealth: Payer: Self-pay | Admitting: Family Medicine

## 2023-06-06 DIAGNOSIS — S52502A Unspecified fracture of the lower end of left radius, initial encounter for closed fracture: Secondary | ICD-10-CM | POA: Diagnosis not present

## 2023-06-07 NOTE — Telephone Encounter (Signed)
She is overdue for follow appt, needs to schedule this month before we can approve any medication refills.

## 2023-06-15 ENCOUNTER — Ambulatory Visit (INDEPENDENT_AMBULATORY_CARE_PROVIDER_SITE_OTHER): Payer: 59 | Admitting: Family Medicine

## 2023-06-15 ENCOUNTER — Encounter: Payer: Self-pay | Admitting: Family Medicine

## 2023-06-15 VITALS — BP 131/54 | HR 81 | Temp 97.8°F | Resp 12 | Ht 63.0 in | Wt 177.0 lb

## 2023-06-15 DIAGNOSIS — Z1211 Encounter for screening for malignant neoplasm of colon: Secondary | ICD-10-CM | POA: Diagnosis not present

## 2023-06-15 DIAGNOSIS — F32A Depression, unspecified: Secondary | ICD-10-CM

## 2023-06-15 DIAGNOSIS — R7303 Prediabetes: Secondary | ICD-10-CM

## 2023-06-15 DIAGNOSIS — I10 Essential (primary) hypertension: Secondary | ICD-10-CM

## 2023-06-15 DIAGNOSIS — M810 Age-related osteoporosis without current pathological fracture: Secondary | ICD-10-CM

## 2023-06-15 DIAGNOSIS — F411 Generalized anxiety disorder: Secondary | ICD-10-CM | POA: Diagnosis not present

## 2023-06-15 DIAGNOSIS — E785 Hyperlipidemia, unspecified: Secondary | ICD-10-CM

## 2023-06-15 MED ORDER — FLUOXETINE HCL 40 MG PO CAPS: 40.0000 mg | ORAL_CAPSULE | Freq: Every day | ORAL | 3 refills | Status: DC

## 2023-06-15 NOTE — Patient Instructions (Signed)
.   Please review the attached list of medications and notify my office if there are any errors.   . Please bring all of your medications to every appointment so we can make sure that our medication list is the same as yours.   

## 2023-06-15 NOTE — Progress Notes (Signed)
Established patient visit   Patient: Gabrielle Salinas   DOB: 1951-07-08   72 y.o. Female  MRN: [id number] Visit Date: 06/15/2023  Today's healthcare provider: [name], MD   Chief Complaint  Patient presents with   Hyperlipidemia   Hypertension   Prediabetes   Depression   Subjective    HPI Discussed the use of AI scribe software for clinical note transcription with the patient, who gave verbal consent to proceed.  History of Present Illness   The patient, with a history of diabetes, hypertension, and osteoporosis, presents for a routine checkup. She reports that her medications have been effective and she has been monitoring her blood sugar levels at home with the help of her family, with no significant concerns noted. However, she admits to being less active recently due to the hot weather.  The patient also reports a recent wrist fracture, which occurred when she tripped while carrying groceries. The injury was initially managed with a brace and later a cast. She experienced significant nausea and diarrhea from the pain medication prescribed for this injury, which has limited her activities and driving.  In addition to the physical discomfort, the patient has been feeling down at times, which she attributes to worrying about her siblings who also have health issues, including diabetes and osteoporosis. She also recently adopted a puppy, which has been keeping her busy.  She is currently taking fluoxetine, lisinopril, and hydrochlorothiazide. She feels fluoxetine has been effective, but needs to increase dose due to a lot of stress she has been under.       Medications: Outpatient Medications Prior to Visit  Medication Sig   acetaminophen (TYLENOL) 325 MG tablet Take 650 mg by mouth every 4 (four) hours as needed for mild pain or fever.   alendronate (FOSAMAX) 70 MG tablet TAKE 1 TABLET BY MOUTH EVERY WEEK WITH A FULL GLASS OF WATER AND ON AN EMPTY STOMACH    ALPRAZolam (XANAX) 0.25 MG tablet TAKE 1 TABLET(0.25 MG) BY MOUTH DAILY AS NEEDED FOR ANXIETY   azelastine (ASTELIN) 0.1 % nasal spray USE 2 SPRAYS IN EACH NOSTRIL TWICE DAILY AS DIRECTED   Calcium Carb-Cholecalciferol 600-500 MG-UNIT CAPS Take 600 Units by mouth daily.   clorazepate (TRANXENE) 15 MG tablet TAKE 1/2 TABLET BY MOUTH THREE TIMES DAILY AS NEEDED   esomeprazole (NEXIUM) 40 MG capsule TAKE 1 CAPSULE BY MOUTH EVERY DAY AT NOON   ezetimibe (ZETIA) 10 MG tablet TAKE 1 TABLET(10 MG) BY MOUTH DAILY   hydrochlorothiazide (HYDRODIURIL) 12.5 MG tablet TAKE 1 TABLET(12.5 MG) BY MOUTH DAILY   lisinopril (ZESTRIL) 10 MG tablet TAKE 1 TABLET(10 MG) BY MOUTH DAILY   Misc Natural Products (JOINT SUPPORT) CAPS Take 1 capsule by mouth daily.   montelukast (SINGULAIR) 10 MG tablet TAKE 1 TABLET BY MOUTH EVERY DAY FOR ALLERGIES   Multiple Vitamins-Minerals (PRESERVISION AREDS 2 PO) Take 1 tablet by mouth 2 (two) times daily.   Omega-3 Fatty Acids (FISH OIL) 1000 MG CAPS Take 1 capsule by mouth daily.    triamcinolone cream (KENALOG) 0.5 % APPLY TOPICALLY TO THE AFFECTED AREA DAILY AS NEEDED   Turmeric 500 MG CAPS Take 1 capsule by mouth daily.   vitamin C (ASCORBIC ACID) 500 MG tablet Take 500 mg by mouth daily.   vitamin E 1000 UNIT capsule Take 1,000 Units by mouth daily.    [DISCONTINUED] FLUoxetine (PROZAC) 20 MG capsule TAKE 1 CAPSULE(20 MG) BY MOUTH DAILY   phentermine 15 MG  capsule TAKE 1 CAPSULE(15 MG) BY MOUTH EVERY MORNING (Patient not taking: Reported on 06/15/2023)   No facility-administered medications prior to visit.     Objective    BP (!) 131/54 (BP Location: Right Arm, Patient Position: Sitting, Cuff Size: Large)   Pulse 81   Temp 97.8 F (36.6 C) (Temporal)   Resp 12   Ht 5\' 3"  (1.6 m)   Wt 177 lb (80.3 kg)   LMP 11/27/1980   SpO2 98%   BMI 31.35 kg/m   Physical Exam   General: Appearance:    Mildly obese female in no acute distress  Eyes:    PERRL,  conjunctiva/corneas clear, EOM's intact       Lungs:     Clear to auscultation bilaterally, respirations unlabored  Heart:    Normal heart rate. Normal rhythm. No murmurs, rubs, or gallops.    MS:   All extremities are intact.    Neurologic:   Awake, alert, oriented x 3. No apparent focal neurological defect.          Assessment & Plan     1. Primary hypertension Well controlled.  Continue current medications.   - CBC - Magnesium  2. Hyperlipidemia, unspecified hyperlipidemia type Intolerant to statins, doing well on ezetimibe  - TSH - Comprehensive metabolic panel - Lipid panel  3. Prediabetes  - Hemoglobin A1c  4. Anxiety state Continue prn alprazolam  5. Depressive disorder - Increase from 20 mg to FLUoxetine (PROZAC) 40 MG capsule; Take 1 capsule (40 mg total) by mouth daily.  Dispense: 90 capsule; Refill: 3   6. Osteoporosis, unspecified osteoporosis type, unspecified pathological fracture presence Recent wrist fracture followed by orthopedics.   - VITAMIN D 25 Hydroxy (Vit-D Deficiency, Fractures) - TSH  7. Colon cancer screening  - Cologuard - Change Resulting Agency to [employer]        [name], MD  [facility] [phone] (phone) [phone] (fax)  [facility]

## 2023-06-20 DIAGNOSIS — M810 Age-related osteoporosis without current pathological fracture: Secondary | ICD-10-CM | POA: Diagnosis not present

## 2023-06-20 DIAGNOSIS — E785 Hyperlipidemia, unspecified: Secondary | ICD-10-CM | POA: Diagnosis not present

## 2023-06-20 DIAGNOSIS — I10 Essential (primary) hypertension: Secondary | ICD-10-CM | POA: Diagnosis not present

## 2023-06-20 DIAGNOSIS — S52502A Unspecified fracture of the lower end of left radius, initial encounter for closed fracture: Secondary | ICD-10-CM | POA: Diagnosis not present

## 2023-06-20 DIAGNOSIS — R7303 Prediabetes: Secondary | ICD-10-CM | POA: Diagnosis not present

## 2023-06-21 LAB — COMPREHENSIVE METABOLIC PANEL
AST: 24 IU/L (ref 0–40)
Albumin: 4.6 g/dL (ref 3.8–4.8)
Alkaline Phosphatase: 114 IU/L (ref 44–121)
BUN/Creatinine Ratio: 18 (ref 12–28)
Bilirubin Total: 0.4 mg/dL (ref 0.0–1.2)
CO2: 28 mmol/L (ref 20–29)
Calcium: 9.8 mg/dL (ref 8.7–10.3)
Chloride: 94 mmol/L — ABNORMAL LOW (ref 96–106)
Creatinine, Ser: 0.82 mg/dL (ref 0.57–1.00)
Glucose: 114 mg/dL — ABNORMAL HIGH (ref 70–99)
Potassium: 4.5 mmol/L (ref 3.5–5.2)
Sodium: 137 mmol/L (ref 134–144)
Total Protein: 7.5 g/dL (ref 6.0–8.5)
eGFR: 75 mL/min/{1.73_m2} (ref >59)

## 2023-06-21 LAB — CBC
Hematocrit: 43.1 % (ref 34.0–46.6)
MCH: 32.3 pg (ref 26.6–33.0)
MCV: 96 fL (ref 79–97)
Platelets: 292 10*3/uL (ref 150–450)
RBC: 4.49 x10E6/uL (ref 3.77–5.28)
WBC: 8.8 10*3/uL (ref 3.4–10.8)

## 2023-06-21 LAB — LIPID PANEL
HDL: 61 mg/dL (ref >39)
LDL Chol Calc (NIH): 132 mg/dL — ABNORMAL HIGH (ref 0–99)
Triglycerides: 230 mg/dL — ABNORMAL HIGH (ref 0–149)
VLDL Cholesterol Cal: 41 mg/dL — ABNORMAL HIGH (ref 5–40)

## 2023-06-21 LAB — HEMOGLOBIN A1C
Est. average glucose Bld gHb Est-mCnc: 128 mg/dL
Hgb A1c MFr Bld: 6.1 % — ABNORMAL HIGH (ref 4.8–5.6)

## 2023-06-21 LAB — TSH: TSH: 1.7 u[IU]/mL (ref 0.450–4.500)

## 2023-06-21 LAB — MAGNESIUM: Magnesium: 2 mg/dL (ref 1.6–2.3)

## 2023-06-21 LAB — VITAMIN D 25 HYDROXY (VIT D DEFICIENCY, FRACTURES): Vit D, 25-Hydroxy: 51.8 ng/mL (ref 30.0–100.0)

## 2023-06-24 DIAGNOSIS — Z1211 Encounter for screening for malignant neoplasm of colon: Secondary | ICD-10-CM | POA: Diagnosis not present

## 2023-07-11 DIAGNOSIS — S52502A Unspecified fracture of the lower end of left radius, initial encounter for closed fracture: Secondary | ICD-10-CM | POA: Diagnosis not present

## 2023-07-18 ENCOUNTER — Telehealth: Payer: Self-pay | Admitting: Family Medicine

## 2023-07-18 NOTE — Telephone Encounter (Signed)
LOV 07/19 for chronic disease

## 2023-07-18 NOTE — Telephone Encounter (Signed)
Patient has called in requesting a weight loss medication, patient denied in scheduling an appointment. Patient stated she was previously on Phentermine but was requesting Ozempic. Offered patient appointment to discuss this with PCP and patient declined in scheduling visit. Patient stated she is fine with trying Phentermine again. Patient stated she is pre diabetic and needs something to help her loose weight, that she can not get under 170 lbs. Please advise.    Patients callback #:  [phone]

## 2023-07-19 ENCOUNTER — Other Ambulatory Visit: Payer: Self-pay | Admitting: Family Medicine

## 2023-07-19 DIAGNOSIS — E669 Obesity, unspecified: Secondary | ICD-10-CM

## 2023-07-19 MED ORDER — PHENTERMINE HCL 15 MG PO CAPS
15.0000 mg | ORAL_CAPSULE | ORAL | 1 refills | Status: DC
Start: 2023-07-19 — End: 2023-09-20

## 2023-07-19 NOTE — Telephone Encounter (Signed)
Have sent prescription refill for phentermine, needs to schedule appt in 4 weeks.

## 2023-07-19 NOTE — Progress Notes (Unsigned)
Have sent prescription for phentermine to [facility]. Needs to schedule office visit in 4 weeks.

## 2023-07-20 NOTE — Telephone Encounter (Signed)
Patient advised. Verbalized understanding. Appt scheduled

## 2023-07-25 ENCOUNTER — Other Ambulatory Visit: Payer: Self-pay | Admitting: Family Medicine

## 2023-07-25 DIAGNOSIS — E785 Hyperlipidemia, unspecified: Secondary | ICD-10-CM

## 2023-07-31 ENCOUNTER — Other Ambulatory Visit: Payer: Self-pay | Admitting: Family Medicine

## 2023-07-31 DIAGNOSIS — W57XXXA Bitten or stung by nonvenomous insect and other nonvenomous arthropods, initial encounter: Secondary | ICD-10-CM

## 2023-08-01 NOTE — Telephone Encounter (Signed)
Requested medication (s) are due for refill today: routing for review  Requested medication (s) are on the active medication list: yes  Last refill:  05/08/23  Future visit scheduled: yes  Notes to clinic:  Unable to refill per protocol, cannot delegate.      Requested Prescriptions  Pending Prescriptions Disp Refills   triamcinolone cream (KENALOG) 0.5 % [Pharmacy Med Name: TRIAMCINOLONE 0.5% CREAM 15GM] 30 g 1    Sig: APPLY TOPICALLY TO THE AFFECTED AREA DAILY AS NEEDED     Not Delegated - Dermatology:  Corticosteroids Failed - 07/31/2023  3:41 AM      Failed - This refill cannot be delegated      Passed - Valid encounter within last 12 months    Recent Outpatient Visits           1 month ago Primary hypertension   [place] [facility] Malva Limes, MD   11 months ago Prediabetes   [facility] Malva Limes, MD   1 year ago Depressive disorder   [facility] Mecum, Oswaldo Conroy, PA-C   1 year ago Osteoporosis, unspecified osteoporosis type, unspecified pathological fracture presence   [facility] Malva Limes, MD   1 year ago Viral URI with cough   [place] [facility] Mecum, Oswaldo Conroy, PA-C       Future Appointments             In 2 weeks Fisher, Demetrios Isaacs, MD [facility]

## 2023-08-07 DIAGNOSIS — H1031 Unspecified acute conjunctivitis, right eye: Secondary | ICD-10-CM | POA: Diagnosis not present

## 2023-08-08 DIAGNOSIS — S52502A Unspecified fracture of the lower end of left radius, initial encounter for closed fracture: Secondary | ICD-10-CM | POA: Diagnosis not present

## 2023-08-15 ENCOUNTER — Other Ambulatory Visit: Payer: Self-pay | Admitting: Family Medicine

## 2023-08-15 DIAGNOSIS — F411 Generalized anxiety disorder: Secondary | ICD-10-CM

## 2023-08-16 DIAGNOSIS — S52502D Unspecified fracture of the lower end of left radius, subsequent encounter for closed fracture with routine healing: Secondary | ICD-10-CM | POA: Diagnosis not present

## 2023-08-17 ENCOUNTER — Ambulatory Visit (INDEPENDENT_AMBULATORY_CARE_PROVIDER_SITE_OTHER): Payer: 59 | Admitting: Family Medicine

## 2023-08-17 VITALS — BP 134/69 | HR 84 | Ht 63.0 in | Wt 176.1 lb

## 2023-08-17 DIAGNOSIS — R7303 Prediabetes: Secondary | ICD-10-CM

## 2023-08-17 DIAGNOSIS — G8929 Other chronic pain: Secondary | ICD-10-CM | POA: Diagnosis not present

## 2023-08-17 DIAGNOSIS — M25561 Pain in right knee: Secondary | ICD-10-CM

## 2023-08-17 DIAGNOSIS — E669 Obesity, unspecified: Secondary | ICD-10-CM

## 2023-08-17 DIAGNOSIS — Z23 Encounter for immunization: Secondary | ICD-10-CM | POA: Diagnosis not present

## 2023-08-17 MED ORDER — BUPROPION HCL ER (XL) 150 MG PO TB24: 150.0000 mg | ORAL_TABLET | Freq: Every morning | ORAL | 3 refills | Status: DC

## 2023-08-17 NOTE — Progress Notes (Signed)
Established patient visit   Patient: Gabrielle Salinas   DOB: 1951-07-13   72 y.o. Female  MRN: [id number] Visit Date: 08/17/2023  Today's healthcare provider: [name], MD   Chief Complaint  Patient presents with   Follow-up    4wk f/u and interested in something for weight loss   Subjective    Discussed the use of AI scribe software for clinical note transcription with the patient, who gave verbal consent to proceed.  History of Present Illness   The patient, with a history of obesity and joint replacements, presents with ongoing weight management issues and joint pain. She has been on phentermine for weight loss for about three weeks but report no noticeable effect. She has a history of hip replacement and now report a sensation of her knee 'popping out.' An orthopedist has recommended knee replacement, but the patient is currently declining this intervention.  The patient also reports a recent wrist fracture. She has been trying to stay active by walking a new puppy and doing chair exercises, especially when weather prevents outdoor activity. She expresses a desire to improve her balance, which she believes has been affected by her hip surgery.  The patient has been using a brace for support and has been working with a physical therapist. She also reports increased use of Xanax due to stress related to the new puppy and visits from her hyperactive grandchildren.       Medications: Outpatient Medications Prior to Visit  Medication Sig   acetaminophen (TYLENOL) 325 MG tablet Take 650 mg by mouth every 4 (four) hours as needed for mild pain or fever.   alendronate (FOSAMAX) 70 MG tablet TAKE 1 TABLET BY MOUTH EVERY WEEK WITH A FULL GLASS OF WATER AND ON AN EMPTY STOMACH   ALPRAZolam (XANAX) 0.25 MG tablet TAKE 1 TABLET(0.25 MG) BY MOUTH DAILY AS NEEDED FOR ANXIETY   azelastine (ASTELIN) 0.1 % nasal spray USE 2 SPRAYS IN EACH NOSTRIL TWICE DAILY AS DIRECTED   Calcium  Carb-Cholecalciferol 600-500 MG-UNIT CAPS Take 600 Units by mouth daily.   clorazepate (TRANXENE) 15 MG tablet TAKE 1/2 TABLET BY MOUTH THREE TIMES DAILY AS NEEDED   esomeprazole (NEXIUM) 40 MG capsule TAKE 1 CAPSULE BY MOUTH EVERY DAY AT NOON   ezetimibe (ZETIA) 10 MG tablet TAKE 1 TABLET(10 MG) BY MOUTH DAILY   FLUoxetine (PROZAC) 40 MG capsule Take 1 capsule (40 mg total) by mouth daily.   hydrochlorothiazide (HYDRODIURIL) 12.5 MG tablet TAKE 1 TABLET(12.5 MG) BY MOUTH DAILY   lisinopril (ZESTRIL) 10 MG tablet TAKE 1 TABLET(10 MG) BY MOUTH DAILY   Misc Natural Products (JOINT SUPPORT) CAPS Take 1 capsule by mouth daily.   montelukast (SINGULAIR) 10 MG tablet TAKE 1 TABLET BY MOUTH EVERY DAY FOR ALLERGIES   Multiple Vitamins-Minerals (PRESERVISION AREDS 2 PO) Take 1 tablet by mouth 2 (two) times daily.   Omega-3 Fatty Acids (FISH OIL) 1000 MG CAPS Take 1 capsule by mouth daily.    phentermine 15 MG capsule Take 1 capsule (15 mg total) by mouth every morning.   triamcinolone cream (KENALOG) 0.5 % APPLY TOPICALLY TO THE AFFECTED AREA DAILY AS NEEDED   Turmeric 500 MG CAPS Take 1 capsule by mouth daily.   vitamin C (ASCORBIC ACID) 500 MG tablet Take 500 mg by mouth daily.   vitamin E 1000 UNIT capsule Take 1,000 Units by mouth daily.    No facility-administered medications prior to visit.   Review of Systems  Objective    BP 134/69 (BP Location: Left Arm, Patient Position: Sitting, Cuff Size: Large)   Pulse 84   Ht 5\' 3"  (1.6 m)   Wt 176 lb 1.6 oz (79.9 kg)   LMP 11/27/1980   SpO2 98%   BMI 31.19 kg/m   Physical Exam  Physical Exam        No results found for any visits on 08/17/23.  Assessment & Plan        Obesity Patient reports that Phentermine is not effective after 3 weeks of use. No side effects reported. Blood pressure is stable. Discussed the potential benefits of adding Wellbutrin to the regimen. -Add Wellbutrin once daily in the morning. -Continue  Phentermine. -Previously prescribed topiramate but discontinued due to possible, although uncertain side effect. Consider adding topiramate or naltrexone -Check weight and blood pressure in 6 weeks.  Orthopedic issues Patient reports a history of hip replacement and current need for knee replacement. She is experiencing balance issues post-hip surgery and has been working with a physical therapist. She is also staying active by walking her new puppy. -Continue physical therapy and daily walks. -Consider knee replacement surgery when ready.  General Health Maintenance -Administer influenza vaccine today.    Return in about 6 weeks (around 09/28/2023).      [name], MD  [facility] [phone] (phone) [phone] (fax)  [facility]

## 2023-08-17 NOTE — Patient Instructions (Signed)
.   Please review the attached list of medications and notify my office if there are any errors.   . Please bring all of your medications to every appointment so we can make sure that our medication list is the same as yours.   

## 2023-08-20 DIAGNOSIS — Z961 Presence of intraocular lens: Secondary | ICD-10-CM | POA: Diagnosis not present

## 2023-08-30 ENCOUNTER — Other Ambulatory Visit: Payer: Self-pay | Admitting: Family Medicine

## 2023-09-04 DIAGNOSIS — S52502D Unspecified fracture of the lower end of left radius, subsequent encounter for closed fracture with routine healing: Secondary | ICD-10-CM | POA: Diagnosis not present

## 2023-09-10 DIAGNOSIS — S52502D Unspecified fracture of the lower end of left radius, subsequent encounter for closed fracture with routine healing: Secondary | ICD-10-CM | POA: Diagnosis not present

## 2023-09-12 DIAGNOSIS — S52502A Unspecified fracture of the lower end of left radius, initial encounter for closed fracture: Secondary | ICD-10-CM | POA: Diagnosis not present

## 2023-09-15 ENCOUNTER — Other Ambulatory Visit: Payer: Self-pay | Admitting: Family Medicine

## 2023-09-16 ENCOUNTER — Other Ambulatory Visit: Payer: Self-pay | Admitting: Nurse Practitioner

## 2023-09-16 DIAGNOSIS — J020 Streptococcal pharyngitis: Secondary | ICD-10-CM

## 2023-09-17 NOTE — Telephone Encounter (Signed)
Requested medication (s) are due for refill today:   No  Requested medication (s) are on the active medication list:   No  Future visit scheduled:   Yes 11/1 with Dr. Sherrie Mustache   Last ordered: Feb. 2024 from a telehealth visit with Bertram Denver, PA.    Returned because there is not a protocol for this and it's not on her medication list.   Requested Prescriptions  Pending Prescriptions Disp Refills   amoxicillin (AMOXIL) 500 MG capsule [Pharmacy Med Name: AMOXICILLIN 500MG  CAPSULES] 20 capsule 0    Sig: TAKE 1 CAPSULE(500 MG) BY MOUTH TWICE DAILY FOR 10 DAYS     Off-Protocol Failed - 09/16/2023  3:39 AM      Failed - Medication not assigned to a protocol, review manually.      Passed - Valid encounter within last 12 months    Recent Outpatient Visits           1 month ago Need for influenza vaccination   [facility] Malva Limes, MD   3 months ago Primary hypertension   [place] [facility] Malva Limes, MD   1 year ago Prediabetes   [place] [facility] Malva Limes, MD   1 year ago Depressive disorder   [place] [facility] Mecum, Oswaldo Conroy, PA-C   1 year ago Osteoporosis, unspecified osteoporosis type, unspecified pathological fracture presence   [place] [facility] Malva Limes, MD       Future Appointments             In 1 week Fisher, Demetrios Isaacs, MD [facility]

## 2023-09-19 ENCOUNTER — Other Ambulatory Visit: Payer: Self-pay | Admitting: Family Medicine

## 2023-09-19 DIAGNOSIS — S52502D Unspecified fracture of the lower end of left radius, subsequent encounter for closed fracture with routine healing: Secondary | ICD-10-CM | POA: Diagnosis not present

## 2023-09-19 DIAGNOSIS — E669 Obesity, unspecified: Secondary | ICD-10-CM

## 2023-09-28 ENCOUNTER — Emergency Department
Admission: EM | Admit: 2023-09-28 | Discharge: 2023-09-28 | Disposition: A | Discharge status: Home / Self Care | Payer: 59 | Attending: Emergency Medicine | Admitting: Emergency Medicine

## 2023-09-28 ENCOUNTER — Emergency Department: Payer: 59

## 2023-09-28 ENCOUNTER — Other Ambulatory Visit: Payer: Self-pay

## 2023-09-28 ENCOUNTER — Ambulatory Visit: Payer: 59 | Admitting: Family Medicine

## 2023-09-28 ENCOUNTER — Encounter: Payer: Self-pay | Admitting: Emergency Medicine

## 2023-09-28 DIAGNOSIS — S0990XA Unspecified injury of head, initial encounter: Secondary | ICD-10-CM | POA: Insufficient documentation

## 2023-09-28 DIAGNOSIS — M542 Cervicalgia: Secondary | ICD-10-CM | POA: Diagnosis not present

## 2023-09-28 DIAGNOSIS — W01198A Fall on same level from slipping, tripping and stumbling with subsequent striking against other object, initial encounter: Secondary | ICD-10-CM | POA: Insufficient documentation

## 2023-09-28 DIAGNOSIS — I10 Essential (primary) hypertension: Secondary | ICD-10-CM | POA: Diagnosis not present

## 2023-09-28 DIAGNOSIS — S199XXA Unspecified injury of neck, initial encounter: Secondary | ICD-10-CM | POA: Diagnosis not present

## 2023-09-28 DIAGNOSIS — Z743 Need for continuous supervision: Secondary | ICD-10-CM | POA: Diagnosis not present

## 2023-09-28 DIAGNOSIS — I6782 Cerebral ischemia: Secondary | ICD-10-CM | POA: Diagnosis not present

## 2023-09-28 DIAGNOSIS — W19XXXA Unspecified fall, initial encounter: Secondary | ICD-10-CM

## 2023-09-28 MED ORDER — ACETAMINOPHEN 325 MG PO TABS
650.0000 mg | ORAL_TABLET | Freq: Once | ORAL | Status: AC
  Administered 2023-09-28: 650 mg via ORAL
  Filled 2023-09-28: qty 2

## 2023-09-28 NOTE — ED Notes (Signed)
Pt here via [facility] from PCP office, pt lives at home. Pt fell and hit on wall and dented wall, EMS denies blood thinners. Fall happened 30 PTA. No LOC.   156/83 HR: 92 98%

## 2023-09-28 NOTE — ED Triage Notes (Signed)
Pt here after a fall this morning. Pt states she missed a step and fell. Pt states she did hit her head and is also having neck pain. Pt denies being on a blood thinner and did not lose consciousness. Pt stable in triage.

## 2023-09-28 NOTE — ED Provider Notes (Signed)
[facility] Provider Note    Event Date/Time   First MD Initiated Contact with Patient 09/28/23 1147     (approximate)   History   Fall   HPI  Gabrielle Salinas is a 72 y.o. female with PMH of hypertension, osteoporosis, depression and anxiety presents for evaluation after mechanical fall that occurred earlier today.  Patient was rushing to get into her doctor's office for an appointment when she tripped causing her to fall and hit her shoulder and head against a wall.  She did not lose consciousness but did leave a dent in the wall.  She does not take a blood thinner.  She reports mild pain to her neck and head.  She says she feels fine and she did not want to come to the hospital but was encouraged to do so by her doctor.     Physical Exam   Triage Vital Signs: ED Triage Vitals  Encounter Vitals Group     BP 09/28/23 1126 (!) 146/73     Systolic BP Percentile --      Diastolic BP Percentile --      Pulse Rate 09/28/23 1126 87     Resp 09/28/23 1126 18     Temp 09/28/23 1128 98.3 F (36.8 C)     Temp Source 09/28/23 1128 Oral     SpO2 09/28/23 1126 97 %     Weight 09/28/23 1127 176 lb 2.4 oz (79.9 kg)     Height 09/28/23 1127 5\' 3"  (1.6 m)     Head Circumference --      Peak Flow --      Pain Score 09/28/23 1126 6     Pain Loc --      Pain Education --      Exclude from Growth Chart --     Most recent vital signs: Vitals:   09/28/23 1126 09/28/23 1128  BP: (!) 146/73   Pulse: 87   Resp: 18   Temp:  98.3 F (36.8 C)  SpO2: 97%     General: Awake, no distress.  CV:  Good peripheral perfusion.  RRR. Resp:  Normal effort.  CTAB. Abd:  No distention.  Other:  No focal neurodeficits, PERRL, EOM intact, no ataxia, no contusions or lacerations to the back of patient's head.   ED Results / Procedures / Treatments   Labs (all labs ordered are listed, but only abnormal results are displayed) Labs Reviewed - No data to  display   RADIOLOGY  CT head and neck obtained, interpreted the images as well as reviewed the radiologist report which was negative for any acute intracranial abnormalities and fractures.   PROCEDURES:  Critical Care performed: No  Procedures   MEDICATIONS ORDERED IN ED: Medications  acetaminophen (TYLENOL) tablet 650 mg (has no administration in time range)     IMPRESSION / MDM / ASSESSMENT AND PLAN / ED COURSE  I reviewed the triage vital signs and the nursing notes.                             72 year old female presents for evaluation of head and neck pain after a fall earlier today.  Patient was hypertensive in triage but does have history of hypertension otherwise vital signs stable.  Patient NAD on exam.  Differential diagnosis includes, but is not limited to, contusion, concussion, laceration, intracranial hemorrhage, cervical spine fracture, muscle strain.  Patient's presentation is most  consistent with acute complicated illness / injury requiring diagnostic workup.  CT head and neck obtained, I interpreted the images as well as reviewed the radiologist report which is negative for any acute intracranial abnormalities, no fractures of the cervical spine.  Given patient's negative imaging and reassuring findings on physical exam I feel she stable for outpatient management.  I advised her on pain management using Tylenol and ibuprofen.  She can use ice and heat if she feels necessary for her neck pain.  She can follow-up with her primary care provider as needed.  She voiced understanding, all questions were answered and she is stable at discharge.      FINAL CLINICAL IMPRESSION(S) / ED DIAGNOSES   Final diagnoses:  Fall, initial encounter     Rx / DC Orders   ED Discharge Orders     None        Note:  This document was prepared using Dragon voice recognition software and may include unintentional dictation errors.   Cameron Ali, PA-C 09/28/23  1321    Sharman Cheek, MD 09/29/23 2251

## 2023-09-28 NOTE — Discharge Instructions (Addendum)
Your CT scans of your head and your neck were normal today.  You can take 650 mg of Tylenol and 600 mg of ibuprofen every 6 hours as needed for pain.  You can use ice, heat and topical pain relievers as well.  Follow up with your primary care provider as needed.

## 2023-10-02 ENCOUNTER — Telehealth: Payer: Self-pay

## 2023-10-02 ENCOUNTER — Ambulatory Visit (INDEPENDENT_AMBULATORY_CARE_PROVIDER_SITE_OTHER): Payer: 59

## 2023-10-02 DIAGNOSIS — Z1231 Encounter for screening mammogram for malignant neoplasm of breast: Secondary | ICD-10-CM

## 2023-10-02 DIAGNOSIS — Z0000 Encounter for general adult medical examination without abnormal findings: Secondary | ICD-10-CM | POA: Diagnosis not present

## 2023-10-02 NOTE — Progress Notes (Signed)
Subjective:   Gabrielle Salinas is a 72 y.o. female who presents for Medicare Annual (Subsequent) preventive examination.  Visit Complete: Virtual I connected with  Legrand Pitts on 10/02/23 by a audio enabled telemedicine application and verified that I am speaking with the correct person using two identifiers.  Patient Location: Home  Provider Location: Office/Clinic  I discussed the limitations of evaluation and management by telemedicine. The patient expressed understanding and agreed to proceed.  Vital Signs: Because this visit was a virtual/telehealth visit, some criteria may be missing or patient reported. Any vitals not documented were not able to be obtained and vitals that have been documented are patient reported.  Cardiac Risk Factors include: advanced age (>67men, >67 women);dyslipidemia;hypertension;sedentary lifestyle     Objective:    There were no vitals filed for this visit. There is no height or weight on file to calculate BMI.     10/02/2023    1:08 PM 09/28/2023   11:27 AM 11/01/2022    2:37 PM 11/29/2021    8:00 AM 11/28/2021   10:25 PM 10/31/2021    3:18 PM 10/26/2020    2:55 PM  Advanced Directives  Does Patient Have a Medical Advance Directive? No No No No No No Yes  Type of Tax inspector;Living will  Copy of Healthcare Power of Attorney in Chart?       No - copy requested  Would patient like information on creating a medical advance directive? No - Patient declined No - Patient declined No - Patient declined No - Patient declined  No - Patient declined     Current Medications (verified) Outpatient Encounter Medications as of 10/02/2023  Medication Sig   acetaminophen (TYLENOL) 325 MG tablet Take 650 mg by mouth every 4 (four) hours as needed for mild pain or fever.   alendronate (FOSAMAX) 70 MG tablet TAKE 1 TABLET BY MOUTH EVERY WEEK WITH A FULL GLASS OF WATER AND ON AN EMPTY STOMACH   ALPRAZolam (XANAX) 0.25  MG tablet TAKE 1 TABLET(0.25 MG) BY MOUTH DAILY AS NEEDED FOR ANXIETY   azelastine (ASTELIN) 0.1 % nasal spray USE 2 SPRAYS IN EACH NOSTRIL TWICE DAILY AS DIRECTED   buPROPion (WELLBUTRIN XL) 150 MG 24 hr tablet Take 1 tablet (150 mg total) by mouth in the morning.   Calcium Carb-Cholecalciferol 600-500 MG-UNIT CAPS Take 600 Units by mouth daily.   clorazepate (TRANXENE) 15 MG tablet TAKE 1/2 TABLET BY MOUTH THREE TIMES DAILY AS NEEDED   esomeprazole (NEXIUM) 40 MG capsule TAKE 1 CAPSULE BY MOUTH EVERY DAY AT NOON   ezetimibe (ZETIA) 10 MG tablet TAKE 1 TABLET(10 MG) BY MOUTH DAILY   FLUoxetine (PROZAC) 40 MG capsule Take 1 capsule (40 mg total) by mouth daily.   hydrochlorothiazide (HYDRODIURIL) 12.5 MG tablet TAKE 1 TABLET(12.5 MG) BY MOUTH DAILY   lisinopril (ZESTRIL) 10 MG tablet TAKE 1 TABLET(10 MG) BY MOUTH DAILY   Misc Natural Products (JOINT SUPPORT) CAPS Take 1 capsule by mouth daily.   montelukast (SINGULAIR) 10 MG tablet TAKE 1 TABLET BY MOUTH EVERY DAY FOR ALLERGIES   Multiple Vitamins-Minerals (PRESERVISION AREDS 2 PO) Take 1 tablet by mouth 2 (two) times daily.   Omega-3 Fatty Acids (FISH OIL) 1000 MG CAPS Take 1 capsule by mouth daily.    phentermine 15 MG capsule TAKE 1 CAPSULE(15 MG) BY MOUTH EVERY MORNING   triamcinolone cream (KENALOG) 0.5 % APPLY TOPICALLY TO THE AFFECTED AREA  DAILY AS NEEDED   Turmeric 500 MG CAPS Take 1 capsule by mouth daily.   vitamin C (ASCORBIC ACID) 500 MG tablet Take 500 mg by mouth daily.   vitamin E 1000 UNIT capsule Take 1,000 Units by mouth daily.    No facility-administered encounter medications on file as of 10/02/2023.    Allergies (verified) Bactrim [sulfamethoxazole-trimethoprim], Bee venom, Daypro  [oxaprozin], Sulfa antibiotics, and Sulfasalazine   History: Past Medical History:  Diagnosis Date   Allergy    Anxiety    Cataract    COVID-19 virus infection 11/29/2021   Depression    GERD (gastroesophageal reflux disease)     Hyperlipidemia    Hypertension    Osteoporosis    Past Surgical History:  Procedure Laterality Date   BREAST BIOPSY Right <10 yrs ago   x2 benign results   CATARACT EXTRACTION, BILATERAL  09/2019   COLONOSCOPY  11/19/2017   GI Bleed  2008   RHINOPLASTY     TOTAL HIP ARTHROPLASTY Right 11/29/2021   Procedure: TOTAL HIP ARTHROPLASTY ANTERIOR APPROACH;  Surgeon: Kennedy Bucker, MD;  Location: [facility] ORS;  Service: Orthopedics;  Laterality: Right;   UPPER GASTROINTESTINAL ENDOSCOPY  10/2017   [facility]   VAGINAL HYSTERECTOMY  1982   due to abnormal pap smears and menorrhagia   Family History  Problem Relation Age of Onset   Lupus Mother    Heart attack Father    Diabetes Father    Diabetes Sister    Diabetes Sister    Diabetes Sister    Breast cancer Neg Hx    Social History   Socioeconomic History   Marital status: Divorced    Spouse name: Not on file   Number of children: 3   Years of education: Not on file   Highest education level: Associate degree: academic program  Occupational History   Occupation: retired    Comment: keeps granddaughter  Tobacco Use   Smoking status: Never   Smokeless tobacco: Never  Vaping Use   Vaping status: Never Used  Substance and Sexual Activity   Alcohol use: Yes    Alcohol/week: 0.0 standard drinks of alcohol    Comment: 1glass of wine on special occasions   Drug use: Never   Sexual activity: Yes  Other Topics Concern   Not on file  Social History Narrative   Not on file   Social Determinants of Health   Financial Resource Strain: Low Risk  (10/02/2023)   Overall Financial Resource Strain (CARDIA)    Difficulty of Paying Living Expenses: Not hard at all  Food Insecurity: No Food Insecurity (10/02/2023)   Hunger Vital Sign    Worried About Running Out of Food in the Last Year: Never true    Ran Out of Food in the Last Year: Never true  Transportation Needs: No Transportation Needs (10/02/2023)   PRAPARE - Scientist, research (physical sciences) (Medical): No    Lack of Transportation (Non-Medical): No  Physical Activity: Insufficiently Active (10/02/2023)   Exercise Vital Sign    Days of Exercise per Week: 2 days    Minutes of Exercise per Session: 20 min  Stress: No Stress Concern Present (10/02/2023)   [employer] of Occupational Health - Occupational Stress Questionnaire    Feeling of Stress : Not at all  Social Connections: Moderately Integrated (10/02/2023)   Social Connection and Isolation Panel [NHANES]    Frequency of Communication with Friends and Family: More than three times a week  Frequency of Social Gatherings with Friends and Family: Never    Attends Religious Services: More than 4 times per year    Active Member of [employer] or Organizations: Yes    Attends Banker Meetings: Never    Marital Status: Widowed    Tobacco Counseling Counseling given: Not Answered   Clinical Intake:  Pre-visit preparation completed: Yes  Pain : No/denies pain     BMI - recorded: 31.2 Nutritional Status: BMI > 30  Obese Nutritional Risks: None Diabetes: No  How often do you need to have someone help you when you read instructions, pamphlets, or other written materials from your doctor or pharmacy?: 1 - Never  Interpreter Needed?: No  Information entered by :: Kennedy Bucker, LPN   Activities of Daily Living    10/02/2023    1:10 PM 11/01/2022    2:43 PM  In your present state of health, do you have any difficulty performing the following activities:  Hearing? 0 0  Vision? 0 0  Difficulty concentrating or making decisions? 0 0  Walking or climbing stairs? 1 0  Comment knee   Dressing or bathing? 0 0  Doing errands, shopping? 0 0  Preparing Food and eating ? N N  Using the Toilet? N N  In the past six months, have you accidently leaked urine? N N  Do you have problems with loss of bowel control? N N  Managing your Medications? N N  Managing your Finances? N N  Housekeeping or  managing your Housekeeping? N N    Patient Care Team: Malva Limes, MD as PCP - General (Family Medicine) Patty, A. Azucena Kuba, MD as Consulting Physician (Ophthalmology) Juanell Fairly, MD as Referring Physician (Orthopedic Surgery) Elwin Mocha, MD as Consulting Physician (Ophthalmology)  Indicate any recent Medical Services you may have received from other than [facility] providers in the past year (date may be approximate).     Assessment:   This is a routine wellness examination for [place].  Hearing/Vision screen Hearing Screening - Comments:: No aids Vision Screening - Comments:: Readers- Dr.Patty   Goals Addressed             This Visit's Progress    Cut out extra servings         Depression Screen    10/02/2023    1:05 PM 06/15/2023   11:45 AM 11/01/2022    2:36 PM 09/01/2022   10:48 AM 05/09/2022   10:08 AM 04/28/2022    9:20 AM 10/31/2021    3:12 PM  PHQ 2/9 Scores  PHQ - 2 Score 0 2 1 0 0 1 1  PHQ- 9 Score 0 3  3 1 3 1     Fall Risk    10/02/2023    1:10 PM 06/15/2023   11:45 AM 11/01/2022    2:41 PM 09/01/2022   10:48 AM 05/09/2022   10:07 AM  Fall Risk   Falls in the past year? 1 1 1 1 1   Number falls in past yr: 1 1 0 0 0  Injury with Fall? 1 1 1 1 1   Risk for fall due to : History of fall(s) History of fall(s) History of fall(s);Orthopedic patient History of fall(s)   Follow up Falls evaluation completed;Falls prevention discussed Falls evaluation completed Falls prevention discussed;Falls evaluation completed Falls prevention discussed     MEDICARE RISK AT HOME: Medicare Risk at Home Any stairs in or around the home?: Yes If so, are there any without handrails?:  No Home free of loose throw rugs in walkways, pet beds, electrical cords, etc?: Yes Adequate lighting in your home to reduce risk of falls?: Yes Life alert?: No Use of a cane, walker or w/c?: No (has a cane) Grab bars in the bathroom?: Yes Shower chair or bench in shower?:  No Elevated toilet seat or a handicapped toilet?: No  TIMED UP AND GO:  Was the test performed?  No    Cognitive Function:        10/02/2023    1:12 PM 11/01/2022    2:45 PM 09/28/2017    1:49 PM  6CIT Screen  What Year? 0 points 0 points 0 points  What month? 0 points 0 points 0 points  What time? 0 points 0 points 0 points  Count back from 20 0 points 0 points 0 points  Months in reverse 2 points 0 points 0 points  Repeat phrase 0 points 0 points 2 points  Total Score 2 points 0 points 2 points    Immunizations Immunization History  Administered Date(s) Administered   Fluad Quad(high Dose 65+) 08/03/2020, 09/09/2021, 09/01/2022   Fluad Trivalent(High Dose 65+) 08/17/2023   Influenza, High Dose Seasonal PF 09/02/2016, 09/28/2017, 09/30/2018, 08/22/2019   PFIZER(Purple Top)SARS-COV-2 Vaccination 01/09/2020, 02/02/2020, 09/15/2020   Pfizer Covid-19 Vaccine Bivalent Booster 52yrs & up 10/08/2021   Pneumococcal Conjugate-13 07/30/2014   Pneumococcal Polysaccharide-23 10/22/1998, 03/14/2016   Respiratory Syncytial Virus Vaccine,Recomb Aduvanted(Arexvy) 10/09/2022   Td 07/23/1995   Tdap 09/29/2011, 04/28/2021   Zoster Recombinant(Shingrix) 08/22/2019, 10/30/2019   Zoster, Live 07/30/2014    TDAP status: Up to date  Flu Vaccine status: Up to date  Pneumococcal vaccine status: Up to date  Covid-19 vaccine status: Completed vaccines  Qualifies for Shingles Vaccine? Yes   Zostavax completed Yes   Shingrix Completed?: Yes  Screening Tests Health Maintenance  Topic Date Due   MAMMOGRAM  07/20/2023   COVID-19 Vaccine (5 - 2023-24 season) 07/29/2023   DEXA SCAN  07/19/2024   Medicare Annual Wellness (AWV)  10/01/2024   Fecal DNA (Cologuard)  06/23/2026   DTaP/Tdap/Td (4 - Td or Tdap) 04/29/2031   Pneumonia Vaccine 71+ Years old  Completed   INFLUENZA VACCINE  Completed   Hepatitis C Screening  Completed   Zoster Vaccines- Shingrix  Completed   HPV VACCINES  Aged Out    Colonoscopy  Discontinued    Health Maintenance  Health Maintenance Due  Topic Date Due   MAMMOGRAM  07/20/2023   COVID-19 Vaccine (5 - 2023-24 season) 07/29/2023    Colorectal cancer screening: Type of screening: Cologuard. Completed 06/24/23. Repeat every 3 years  Mammogram status: Completed 07/19/22. Repeat every year- ORDERED 10/02/23  Bone Density status: Completed 07/19/22. Results reflect: Bone density results: OSTEOPENIA. Repeat every 5 years.  Lung Cancer Screening: (Low Dose CT Chest recommended if Age 33-80 years, 20 pack-year currently smoking OR have quit w/in 15years.) does not qualify.   Additional Screening:  Hepatitis C Screening: does qualify; Completed 07/30/14  Vision Screening: Recommended annual ophthalmology exams for early detection of glaucoma and other disorders of the eye. Is the patient up to date with their annual eye exam?  Yes  Who is the provider or what is the name of the office in which the patient attends annual eye exams? Dr.Patty If pt is not established with a provider, would they like to be referred to a provider to establish care? No .   Dental Screening: Recommended annual dental exams for proper oral hygiene  Community Resource Referral / Chronic Care Management: CRR required this visit?  No   CCM required this visit?  No     Plan:     I have personally reviewed and noted the following in the patient's chart:   Medical and social history Use of alcohol, tobacco or illicit drugs  Current medications and supplements including opioid prescriptions. Patient is not currently taking opioid prescriptions. Functional ability and status Nutritional status Physical activity Advanced directives List of other physicians Hospitalizations, surgeries, and ER visits in previous 12 months Vitals Screenings to include cognitive, depression, and falls Referrals and appointments  In addition, I have reviewed and discussed with patient  certain preventive protocols, quality metrics, and best practice recommendations. A written personalized care plan for preventive services as well as general preventive health recommendations were provided to patient.     Hal Hope, LPN   52/06/4131   After Visit Summary: (MyChart) Due to this being a telephonic visit, the after visit summary with patients personalized plan was offered to patient via MyChart   Nurse Notes: none

## 2023-10-02 NOTE — Patient Instructions (Addendum)
Gabrielle Salinas , Thank you for taking time to come for your Medicare Wellness Visit. I appreciate your ongoing commitment to your health goals. Please review the following plan we discussed and let me know if I can assist you in the future.   Referrals/Orders/Follow-Ups/Clinician Recommendations: none- mammogram referral done  This is a list of the screening recommended for you and due dates:  Health Maintenance  Topic Date Due   Mammogram  07/20/2023   COVID-19 Vaccine (5 - 2023-24 season) 07/29/2023   DEXA scan (bone density measurement)  07/19/2024   Medicare Annual Wellness Visit  10/01/2024   Cologuard (Stool DNA test)  06/23/2026   DTaP/Tdap/Td vaccine (4 - Td or Tdap) 04/29/2031   Pneumonia Vaccine  Completed   Flu Shot  Completed   Hepatitis C Screening  Completed   Zoster (Shingles) Vaccine  Completed   HPV Vaccine  Aged Out   Colon Cancer Screening  Discontinued    Advanced directives: (ACP Link)Information on Advanced Care Planning can be found at [facility] [place] Advance Health Care Directives Advance Health Care Directives ([email or web address]   Next Medicare Annual Wellness Visit scheduled for next year: Yes    10/07/24 @ 1:00 pm by video

## 2023-10-02 NOTE — Telephone Encounter (Signed)
Dr Sherrie Mustache,  Just an FYI on your patient  I called the patient today to check on her after her fall in the office last week.  She states that the ER did CT of her head and neck and they were negative.  She is using Ibuprofen for pain.  She states she realized after the fact (actually the next day) that it was not her head that hit the wall but her elbow.  She states she saw a large bruise on her elbow and since she did not have head pain she deduced the elbow hit the wall and dented it and not her head.   She has made an appointment with Dr Sherrie Mustache for follow up on Oct 05, 2023.   She was instructed to call if she had any questions, concerns or if she developed any new symptoms or if she had continued or worsening pain.

## 2023-10-05 ENCOUNTER — Encounter: Payer: Self-pay | Admitting: Family Medicine

## 2023-10-05 ENCOUNTER — Ambulatory Visit (INDEPENDENT_AMBULATORY_CARE_PROVIDER_SITE_OTHER): Payer: 59 | Admitting: Family Medicine

## 2023-10-05 VITALS — BP 147/84 | HR 86 | Ht 63.0 in | Wt 171.0 lb

## 2023-10-05 DIAGNOSIS — I10 Essential (primary) hypertension: Secondary | ICD-10-CM | POA: Diagnosis not present

## 2023-10-05 DIAGNOSIS — F411 Generalized anxiety disorder: Secondary | ICD-10-CM | POA: Diagnosis not present

## 2023-10-05 DIAGNOSIS — M810 Age-related osteoporosis without current pathological fracture: Secondary | ICD-10-CM | POA: Diagnosis not present

## 2023-10-05 DIAGNOSIS — F32A Depression, unspecified: Secondary | ICD-10-CM | POA: Diagnosis not present

## 2023-10-05 MED ORDER — ALENDRONATE SODIUM 70 MG PO TABS: ORAL_TABLET | ORAL | 4 refills | Status: AC

## 2023-10-05 NOTE — Patient Instructions (Signed)
.   Please review the attached list of medications and notify my office if there are any errors.   . Please bring all of your medications to every appointment so we can make sure that our medication list is the same as yours.   

## 2023-10-08 NOTE — Progress Notes (Signed)
Established patient visit   Patient: Gabrielle Salinas   DOB: 1951-01-28   72 y.o. Female  MRN: [id number] Visit Date: 10/05/2023  Today's healthcare provider: [name], MD   Chief Complaint  Patient presents with   Follow-up    ED follow-up   Hypertension   Hyperlipidemia   Subjective    Discussed the use of AI scribe software for clinical note transcription with the patient, who gave verbal consent to proceed.  History of Present Illness   Miss Razzaq presents for follow up weight loss medications. The patient has been actively trying to lose weight, engaging in regular walks around her yard and taking phentermine 15mg  the last 3 months. She reports intermittent use of the medication, believing that a break enhances its efficacy upon resumption. No adverse effects such as heart palpitations or racing have been reported.  She also had a fall last week on her way to her appointment and put a dent in the wall which was apparently thought to be from her head at the time, so she went to ER for evaluation. However, she now reports it was her elbow and not her head that put the dent in the well. recent fall. The patient reports that the fall resulted in a bruised elbow, which was initially overlooked. The bruising has since resolved, but the patient experienced a transient tingling sensation radiating from the neck down the arm.  The patient also has a history of osteoporosis and is due for a refill of Fosamax. She has been supplementing with over-the-counter calcium and vitamins.       Medications: Outpatient Medications Prior to Visit  Medication Sig   acetaminophen (TYLENOL) 325 MG tablet Take 650 mg by mouth every 4 (four) hours as needed for mild pain or fever.   ALPRAZolam (XANAX) 0.25 MG tablet TAKE 1 TABLET(0.25 MG) BY MOUTH DAILY AS NEEDED FOR ANXIETY   azelastine (ASTELIN) 0.1 % nasal spray USE 2 SPRAYS IN EACH NOSTRIL TWICE DAILY AS DIRECTED   buPROPion (WELLBUTRIN  XL) 150 MG 24 hr tablet Take 1 tablet (150 mg total) by mouth in the morning.   Calcium Carb-Cholecalciferol 600-500 MG-UNIT CAPS Take 600 Units by mouth daily.   clorazepate (TRANXENE) 15 MG tablet TAKE 1/2 TABLET BY MOUTH THREE TIMES DAILY AS NEEDED   esomeprazole (NEXIUM) 40 MG capsule TAKE 1 CAPSULE BY MOUTH EVERY DAY AT NOON   ezetimibe (ZETIA) 10 MG tablet TAKE 1 TABLET(10 MG) BY MOUTH DAILY   FLUoxetine (PROZAC) 40 MG capsule Take 1 capsule (40 mg total) by mouth daily.   hydrochlorothiazide (HYDRODIURIL) 12.5 MG tablet TAKE 1 TABLET(12.5 MG) BY MOUTH DAILY   lisinopril (ZESTRIL) 10 MG tablet TAKE 1 TABLET(10 MG) BY MOUTH DAILY   Misc Natural Products (JOINT SUPPORT) CAPS Take 1 capsule by mouth daily.   montelukast (SINGULAIR) 10 MG tablet TAKE 1 TABLET BY MOUTH EVERY DAY FOR ALLERGIES   Multiple Vitamins-Minerals (PRESERVISION AREDS 2 PO) Take 1 tablet by mouth 2 (two) times daily.   Omega-3 Fatty Acids (FISH OIL) 1000 MG CAPS Take 1 capsule by mouth daily.    phentermine 15 MG capsule TAKE 1 CAPSULE(15 MG) BY MOUTH EVERY MORNING   triamcinolone cream (KENALOG) 0.5 % APPLY TOPICALLY TO THE AFFECTED AREA DAILY AS NEEDED   Turmeric 500 MG CAPS Take 1 capsule by mouth daily.   vitamin C (ASCORBIC ACID) 500 MG tablet Take 500 mg by mouth daily.   vitamin E 1000 UNIT  capsule Take 1,000 Units by mouth daily.    alendronate (FOSAMAX) 70 MG tablet TAKE 1 TABLET BY MOUTH EVERY WEEK WITH A FULL GLASS OF WATER AND ON AN EMPTY STOMACH   No facility-administered medications prior to visit.   Review of Systems  Constitutional:  Negative for appetite change, chills, fatigue and fever.  Respiratory:  Negative for chest tightness and shortness of breath.   Cardiovascular:  Negative for chest pain and palpitations.  Gastrointestinal:  Negative for abdominal pain, nausea and vomiting.  Neurological:  Negative for dizziness and weakness.       Objective    BP (!) 147/84   Pulse 86   Ht 5\' 3"   (1.6 m)   Wt 171 lb (77.6 kg)   LMP 11/27/1980   SpO2 97%   BMI 30.29 kg/m    Physical Exam   CHEST: lungs clear to auscultation CARDIOVASCULAR: heart sounds normal    No results found for any visits on 10/05/23.  Assessment & Plan        Weight Management Patient is on Phentermine and has been making lifestyle changes including increased physical activity. Weight is slowly and steadily decreasing with no apparent side effects.  -Continue Phentermine as prescribed. -Check weight in 3 months.   Fall with Elbow bruising Bruising and tingling sensation in the arm following a fall. No head injury. No current pain or functional impairment.  Osteoporosis Patient is due for Fosamax refill. -Send Fosamax refill to [facility].  Knee Pain Patient reports knee giving out. Previous suggestion of knee replacement by another provider. Patient considering gel injection. -Consider consultation with orthopedic specialist for further evaluation and treatment options.  -Schedule follow-up appointment in February 2025.    Return in about 3 months (around 01/05/2024).      [name], MD  [facility] [phone] (phone) [phone] (fax)  [facility]

## 2023-10-12 ENCOUNTER — Telehealth: Payer: Self-pay

## 2023-10-12 NOTE — Telephone Encounter (Signed)
 Transition Care Management Unsuccessful Follow-up Telephone Call  Date of discharge and from where:  [place] 11/1  Attempts:  1st Attempt  Reason for unsuccessful TCM follow-up call:  No answer/busy   [facility]inas [facility]  [facility], [facility] Guide, Phone: [phone] Website: [email or web address]

## 2023-10-15 ENCOUNTER — Other Ambulatory Visit: Payer: Self-pay | Admitting: Family Medicine

## 2023-10-15 ENCOUNTER — Telehealth: Payer: Self-pay

## 2023-10-15 DIAGNOSIS — E669 Obesity, unspecified: Secondary | ICD-10-CM

## 2023-10-15 MED ORDER — PHENTERMINE HCL 15 MG PO CAPS: 15.0000 mg | ORAL_CAPSULE | ORAL | 3 refills | Status: DC

## 2023-10-15 NOTE — Telephone Encounter (Signed)
Transition Care Management Unsuccessful Follow-up Telephone Call  Date of discharge and from where:  [place] 11/1  Attempts:  2nd Attempt  Reason for unsuccessful TCM follow-up call:  No answer/busy   [facility]  [facility], [facility] Guide, Phone: [phone] Website: [email or web address]

## 2023-10-27 ENCOUNTER — Other Ambulatory Visit: Payer: Self-pay | Admitting: Family Medicine

## 2023-10-30 NOTE — Telephone Encounter (Signed)
Requested Prescriptions  Pending Prescriptions Disp Refills   azelastine (ASTELIN) 0.1 % nasal spray [Pharmacy Med Name: AZELASTINE 0.1%(137MCG) NASAL-200SP] 30 mL 3    Sig: USE 2 SPRAYS IN EACH NOSTRIL TWICE DAILY AS DIRECTED     Ear, Nose, and Throat: Nasal Preparations - Antiallergy Passed - 10/27/2023  3:42 AM      Passed - Valid encounter within last 12 months    Recent Outpatient Visits           3 weeks ago Primary hypertension   [place] Malva Limes, MD   2 months ago Need for influenza vaccination   [facility] Malva Limes, MD   4 months ago Primary hypertension   [place] [facility] Malva Limes, MD   1 year ago Prediabetes   [facility] Malva Limes, MD   1 year ago Depressive disorder   [facility] Mecum, Oswaldo Conroy, PA-C       Future Appointments             In 2 months Fisher, Demetrios Isaacs, MD [facility]

## 2023-11-13 ENCOUNTER — Other Ambulatory Visit: Payer: Self-pay | Admitting: Family Medicine

## 2023-12-15 ENCOUNTER — Other Ambulatory Visit: Payer: Self-pay | Admitting: Family Medicine

## 2024-01-07 ENCOUNTER — Ambulatory Visit: Payer: Self-pay | Admitting: Family Medicine

## 2024-01-23 ENCOUNTER — Encounter: Payer: Self-pay | Admitting: Family Medicine

## 2024-01-23 ENCOUNTER — Ambulatory Visit (INDEPENDENT_AMBULATORY_CARE_PROVIDER_SITE_OTHER): Payer: 59 | Admitting: Family Medicine

## 2024-01-23 VITALS — BP 135/80 | HR 107 | Resp 16 | Ht 63.0 in | Wt 169.0 lb

## 2024-01-23 DIAGNOSIS — R7303 Prediabetes: Secondary | ICD-10-CM

## 2024-01-23 DIAGNOSIS — F411 Generalized anxiety disorder: Secondary | ICD-10-CM | POA: Diagnosis not present

## 2024-01-23 DIAGNOSIS — I10 Essential (primary) hypertension: Secondary | ICD-10-CM

## 2024-01-23 DIAGNOSIS — E669 Obesity, unspecified: Secondary | ICD-10-CM | POA: Diagnosis not present

## 2024-01-23 LAB — POCT GLYCOSYLATED HEMOGLOBIN (HGB A1C)
Est. average glucose Bld gHb Est-mCnc: 128
Hemoglobin A1C: 6.1 % — AB (ref 4.0–5.6)

## 2024-01-23 MED ORDER — CLORAZEPATE DIPOTASSIUM 15 MG PO TABS: ORAL_TABLET | ORAL | 0 refills | Status: DC

## 2024-01-23 NOTE — Progress Notes (Signed)
 Established patient visit   Patient: Gabrielle Salinas   DOB: 01/06/1951   73 y.o. Female  MRN: [id number] Visit Date: 01/23/2024  Today's healthcare provider: [name], MD   Chief Complaint  Patient presents with   Medical Management of Chronic Issues   Prediabetes   Hypertension   Subjective    Hypertension Pertinent negatives include no chest pain, palpitations or shortness of breath.    Follow up hypertension, prediabetes, obesity. Feels well. Under some stress, but no other complains. Continue to take phentermine intermediately as it stops working if she takes all the time.   Wt Readings from Last 3 Encounters:  01/23/24 169 lb (76.7 kg)  10/05/23 171 lb (77.6 kg)  09/28/23 176 lb 2.4 oz (79.9 kg)    Lab Results  Component Value Date   HGBA1C 6.1 (A) 01/23/2024   HGBA1C 6.1 (H) 06/20/2023   HGBA1C 6.1 (A) 09/01/2022   Lab Results  Component Value Date   NA 137 06/20/2023   K 4.5 06/20/2023   CREATININE 0.82 06/20/2023   EGFR 75 06/20/2023   GLUCOSE 114 (H) 06/20/2023     Medications: Outpatient Medications Prior to Visit  Medication Sig   acetaminophen (TYLENOL) 325 MG tablet Take 650 mg by mouth every 4 (four) hours as needed for mild pain or fever.   alendronate (FOSAMAX) 70 MG tablet TAKE 1 TABLET BY MOUTH EVERY WEEK WITH A FULL GLASS OF WATER AND ON AN EMPTY STOMACH   ALPRAZolam (XANAX) 0.25 MG tablet TAKE 1 TABLET(0.25 MG) BY MOUTH DAILY AS NEEDED FOR ANXIETY   azelastine (ASTELIN) 0.1 % nasal spray USE 2 SPRAYS IN EACH NOSTRIL TWICE DAILY AS DIRECTED   buPROPion (WELLBUTRIN XL) 150 MG 24 hr tablet TAKE 1 TABLET(150 MG) BY MOUTH IN THE MORNING   Calcium Carb-Cholecalciferol 600-500 MG-UNIT CAPS Take 600 Units by mouth daily.   esomeprazole (NEXIUM) 40 MG capsule TAKE 1 CAPSULE BY MOUTH EVERY DAY AT NOON   ezetimibe (ZETIA) 10 MG tablet TAKE 1 TABLET(10 MG) BY MOUTH DAILY   FLUoxetine (PROZAC) 40 MG capsule Take 1 capsule (40 mg total) by  mouth daily.   hydrochlorothiazide (HYDRODIURIL) 12.5 MG tablet TAKE 1 TABLET(12.5 MG) BY MOUTH DAILY   lisinopril (ZESTRIL) 10 MG tablet TAKE 1 TABLET(10 MG) BY MOUTH DAILY   Misc Natural Products (JOINT SUPPORT) CAPS Take 1 capsule by mouth daily.   montelukast (SINGULAIR) 10 MG tablet TAKE 1 TABLET BY MOUTH EVERY DAY FOR ALLERGIES   Multiple Vitamins-Minerals (PRESERVISION AREDS 2 PO) Take 1 tablet by mouth 2 (two) times daily.   Omega-3 Fatty Acids (FISH OIL) 1000 MG CAPS Take 1 capsule by mouth daily.    phentermine 15 MG capsule Take 1 capsule (15 mg total) by mouth every morning.   triamcinolone cream (KENALOG) 0.5 % APPLY TOPICALLY TO THE AFFECTED AREA DAILY AS NEEDED   Turmeric 500 MG CAPS Take 1 capsule by mouth daily.   vitamin C (ASCORBIC ACID) 500 MG tablet Take 500 mg by mouth daily.   vitamin E 1000 UNIT capsule Take 1,000 Units by mouth daily.    [DISCONTINUED] clorazepate (TRANXENE) 15 MG tablet TAKE 1/2 TABLET BY MOUTH THREE TIMES DAILY AS NEEDED (Patient not taking: Reported on 01/23/2024)   No facility-administered medications prior to visit.    Review of Systems  Constitutional:  Negative for appetite change, chills, fatigue and fever.  Respiratory:  Negative for chest tightness and shortness of breath.   Cardiovascular:  Negative for chest pain and palpitations.  Gastrointestinal:  Negative for abdominal pain, nausea and vomiting.  Neurological:  Negative for dizziness and weakness.       Objective    BP 135/80 (BP Location: Left Arm, Patient Position: Sitting, Cuff Size: Large)   Pulse (!) 107   Resp 16   Ht 5\' 3"  (1.6 m)   Wt 169 lb (76.7 kg)   LMP 11/27/1980   SpO2 99%   BMI 29.94 kg/m    Physical Exam  General appearance: Well developed, well nourished female, cooperative and in no acute distress Head: Normocephalic, without obvious abnormality, atraumatic Respiratory: Respirations even and unlabored, normal respiratory rate Extremities: All  extremities are intact.  Skin: Skin color, texture, turgor normal. No rashes seen  Psych: Appropriate mood and affect. Neurologic: Mental status: Alert, oriented to person, place, and time, thought content appropriate.   Results for orders placed or performed in visit on 01/23/24  POCT glycosylated hemoglobin (Hb A1C)  Result Value Ref Range   Hemoglobin A1C 6.1 (A) 4.0 - 5.6 %   Est. average glucose Bld gHb Est-mCnc 128     Assessment & Plan     1. Primary hypertension (Primary) Fairly well controlled. Continue current medications.    2. Prediabetes Continue work on healthy diet and maintain healthy weight.   3. Obesity (BMI 30-39.9) Doing well taking phentermine intermittently with no apparent side effects.   4. Anxiety Take occasional clorazepate or alprazolam.   Refill clorazepate (TRANXENE) 15 MG tablet; TAKE 1/2 TABLET BY MOUTH THREE TIMES DAILY AS NEEDED  Dispense: 30 tablet; Refill: 0   Future Appointments  Date Time Provider Department Center  06/20/2024  8:40 AM Malva Limes, MD [facility]  10/07/2024  1:10 PM [facility]-ANNUAL WELLNESS VISIT [facility]           [name], MD  [facility] [phone] (phone) [phone] (fax)  [facility]

## 2024-01-23 NOTE — Patient Instructions (Addendum)
 Please review the attached list of medications and notify my office if there are any errors.   Please bring all of your medications to every appointment so we can make sure that our medication list is the same as yours.   Please call the [facility] [phone]) to schedule a routine screening mammogram.

## 2024-01-30 ENCOUNTER — Other Ambulatory Visit: Payer: Self-pay | Admitting: Family Medicine

## 2024-01-30 DIAGNOSIS — W57XXXA Bitten or stung by nonvenomous insect and other nonvenomous arthropods, initial encounter: Secondary | ICD-10-CM

## 2024-01-30 NOTE — Telephone Encounter (Signed)
 Requested medication (s) are due for refill today - yes  Requested medication (s) are on the active medication list -yes  Future visit scheduled -yes  Last refill: 08/02/23 30g 1RF  Notes to clinic: non delegated Rx  Requested Prescriptions  Pending Prescriptions Disp Refills   triamcinolone cream (KENALOG) 0.5 % [Pharmacy Med Name: TRIAMCINOLONE 0.5% CREAM 15GM] 30 g 1    Sig: APPLY TOPICALLY TO THE AFFECTED AREA DAILY AS NEEDED     Not Delegated - Dermatology:  Corticosteroids Failed - 01/30/2024 10:11 AM      Failed - This refill cannot be delegated      Passed - Valid encounter within last 12 months    Recent Outpatient Visits           3 months ago Primary hypertension   [place] [facility] Malva Limes, MD   5 months ago Need for influenza vaccination   [facility] Malva Limes, MD   7 months ago Primary hypertension   [place] [facility] Malva Limes, MD   1 year ago Prediabetes   [place] [facility] Malva Limes, MD   1 year ago Depressive disorder   [place] [facility] Mecum, Oswaldo Conroy, PA-C       Future Appointments             In 4 months Fisher, Demetrios Isaacs, MD [place] [facility]               Requested Prescriptions  Pending Prescriptions Disp Refills   triamcinolone cream (KENALOG) 0.5 % [Pharmacy Med Name: TRIAMCINOLONE 0.5% CREAM 15GM] 30 g 1    Sig: APPLY TOPICALLY TO THE AFFECTED AREA DAILY AS NEEDED     Not Delegated - Dermatology:  Corticosteroids Failed - 01/30/2024 10:11 AM      Failed - This refill cannot be delegated      Passed - Valid encounter within last 12 months    Recent Outpatient Visits           3 months ago Primary hypertension   [place] [facility] Malva Limes, MD   5 months ago Need for influenza vaccination   [facility] Malva Limes, MD   7 months ago Primary hypertension   [place] [facility] Malva Limes, MD   1 year ago Prediabetes   [facility] Malva Limes, MD   1 year ago Depressive disorder   [place] [facility] Mecum, Oswaldo Conroy, PA-C       Future Appointments             In 4 months Fisher, Demetrios Isaacs, MD [facility]

## 2024-02-01 ENCOUNTER — Other Ambulatory Visit: Payer: Self-pay | Admitting: Family Medicine

## 2024-02-04 ENCOUNTER — Other Ambulatory Visit: Payer: Self-pay | Admitting: Family Medicine

## 2024-02-04 DIAGNOSIS — E669 Obesity, unspecified: Secondary | ICD-10-CM

## 2024-02-04 MED ORDER — FLUOXETINE HCL 40 MG PO CAPS: 40.0000 mg | ORAL_CAPSULE | Freq: Every day | ORAL | 3 refills | Status: AC

## 2024-02-04 MED ORDER — MONTELUKAST SODIUM 10 MG PO TABS: ORAL_TABLET | ORAL | 1 refills | Status: DC

## 2024-02-04 MED ORDER — ESOMEPRAZOLE MAGNESIUM 40 MG PO CPDR: DELAYED_RELEASE_CAPSULE | ORAL | 1 refills | Status: DC

## 2024-02-04 MED ORDER — HYDROCHLOROTHIAZIDE 12.5 MG PO TABS: ORAL_TABLET | ORAL | 4 refills | Status: AC

## 2024-02-04 MED ORDER — AZELASTINE HCL 0.1 % NA SOLN: NASAL | 3 refills | Status: AC

## 2024-02-04 MED ORDER — LISINOPRIL 10 MG PO TABS: ORAL_TABLET | ORAL | 4 refills | Status: AC

## 2024-02-04 NOTE — Telephone Encounter (Signed)
 Patient requesting the following medication to be refill at [facility] pharmacy.

## 2024-02-04 NOTE — Telephone Encounter (Signed)
 [facility] faxed refill request for the following medications:  esomeprazole (NEXIUM) 40 MG capsule    ezetimibe (ZETIA) 10 MG tablet    FLUoxetine (PROZAC) 40 MG capsule    hydrochlorothiazide (HYDRODIURIL) 12.5 MG tablet    lisinopril (ZESTRIL) 10 MG tablet     montelukast (SINGULAIR) 10 MG tablet    Please advise.

## 2024-02-05 MED ORDER — ALPRAZOLAM 0.25 MG PO TABS: ORAL_TABLET | ORAL | 3 refills | Status: DC

## 2024-02-05 MED ORDER — PHENTERMINE HCL 15 MG PO CAPS
15.0000 mg | ORAL_CAPSULE | ORAL | 3 refills | Status: DC
Start: 2024-02-05 — End: 2024-04-07

## 2024-02-06 ENCOUNTER — Other Ambulatory Visit: Payer: Self-pay | Admitting: Family Medicine

## 2024-02-07 ENCOUNTER — Other Ambulatory Visit: Payer: Self-pay | Admitting: Family Medicine

## 2024-03-14 ENCOUNTER — Ambulatory Visit
Admission: RE | Admit: 2024-03-14 | Discharge: 2024-03-14 | Disposition: A | Discharge status: Home / Self Care | Source: Ambulatory Visit | Attending: Family Medicine | Admitting: Family Medicine

## 2024-03-14 DIAGNOSIS — Z1231 Encounter for screening mammogram for malignant neoplasm of breast: Secondary | ICD-10-CM | POA: Insufficient documentation

## 2024-04-03 ENCOUNTER — Other Ambulatory Visit: Payer: Self-pay | Admitting: Family Medicine

## 2024-04-07 ENCOUNTER — Other Ambulatory Visit: Payer: Self-pay | Admitting: Family Medicine

## 2024-04-07 DIAGNOSIS — E669 Obesity, unspecified: Secondary | ICD-10-CM

## 2024-04-18 ENCOUNTER — Telehealth: Payer: Self-pay | Admitting: Family Medicine

## 2024-05-09 NOTE — Telephone Encounter (Unsigned)
 Copied from CRM [id number]. Topic: Clinical - Medication Question >> May 09, 2024  3:36 PM Rosamond Comes wrote: Reason for CRM: patient calling in. Stating phentermine  15 MG capsule  is not working, patient would like a higher dosage or a different medication for weight loss.  Patient [phone]

## 2024-05-19 NOTE — Telephone Encounter (Signed)
 Need o.v. to discuss options. Will address at upcoming appointment in July

## 2024-06-11 ENCOUNTER — Other Ambulatory Visit: Payer: Self-pay | Admitting: Family Medicine

## 2024-06-11 DIAGNOSIS — E785 Hyperlipidemia, unspecified: Secondary | ICD-10-CM

## 2024-06-20 ENCOUNTER — Ambulatory Visit: Payer: 59 | Admitting: Family Medicine

## 2024-07-08 ENCOUNTER — Telehealth: Payer: Self-pay

## 2024-07-08 NOTE — Telephone Encounter (Signed)
 Pt has been informed of recommendation, blood work and ac will be checked as last lab work was 05/2023. Advised pt to be fasted for at least 8 hours.

## 2024-07-08 NOTE — Telephone Encounter (Signed)
 Copied from CRM [id number]. Topic: Appointments - Appointment Scheduling >> Jul 08, 2024  1:03 PM Gabrielle Salinas wrote: Patient/patient representative is calling to schedule an appointment. Refer to attachments for appointment information.    Needing to reschedule missed appt - for Hypertension, cholesterol and blood pressure. Wednesday Aug 06, 2024 Arrive by 1:25 PM Appt at 1:40 PM (20 min)     Patient wants to know if she should be fasting for this appointment, wanting to know if her A1C will be checked/orders for labs. Please advise # [phone]

## 2024-07-11 ENCOUNTER — Other Ambulatory Visit: Payer: Self-pay | Admitting: Family Medicine

## 2024-07-12 ENCOUNTER — Other Ambulatory Visit: Payer: Self-pay | Admitting: Family Medicine

## 2024-07-25 ENCOUNTER — Other Ambulatory Visit: Payer: Self-pay | Admitting: Family Medicine

## 2024-07-25 NOTE — Telephone Encounter (Signed)
 Copied from CRM [id number]. Topic: Clinical - Medication Refill >> Jul 25, 2024  4:24 PM Zebedee SAUNDERS wrote: Medication: ALPRAZolam  (XANAX ) 0.25 MG tablet  Has the patient contacted their pharmacy? Yes (Agent: If no, request that the patient contact the pharmacy for the refill. If patient does not wish to contact the pharmacy document the reason why and proceed with request.) (Agent: If yes, when and what did the pharmacy advise?)Pharmacy need PCP approval  This is the patient's preferred pharmacy:  [id number] [place], [place] - [address] AT [facility] [address] [place] [address] Phone: [phone] Fax: [phone]  Is this the correct pharmacy for this prescription? Yes If no, delete pharmacy and type the correct one.   Has the prescription been filled recently? Yes  Is the patient out of the medication? Yes  Has the patient been seen for an appointment in the last year OR does the patient have an upcoming appointment? Yes  Can we respond through MyChart? Yes  Agent: Please be advised that Rx refills may take up to 3 business days. We ask that you follow-up with your pharmacy.

## 2024-07-28 NOTE — Telephone Encounter (Signed)
 Requested medication (s) are due for refill today - yes  Requested medication (s) are on the active medication list -yes  Future visit scheduled -yes  Last refill: 04/18/24 #30 1RF  Notes to clinic: non delegated Rx  Requested Prescriptions  Pending Prescriptions Disp Refills   ALPRAZolam  (XANAX ) 0.25 MG tablet 30 tablet 1    Sig: TAKE 1 TABLET(0.25 MG) BY MOUTH DAILY AS NEEDED FOR ANXIETY     Not Delegated - Psychiatry: Anxiolytics/Hypnotics 2 Failed - 07/28/2024  6:56 AM      Failed - This refill cannot be delegated      Failed - Urine Drug Screen completed in last 360 days      Failed - Valid encounter within last 6 months    Recent Outpatient Visits           6 months ago Primary hypertension   [place] [facility] Gas[place] Nancyann BRAVO, MD              Passed - Patient is not pregnant         Requested Prescriptions  Pending Prescriptions Disp Refills   ALPRAZolam  (XANAX ) 0.25 MG tablet 30 tablet 1    Sig: TAKE 1 TABLET(0.25 MG) BY MOUTH DAILY AS NEEDED FOR ANXIETY     Not Delegated - Psychiatry: Anxiolytics/Hypnotics 2 Failed - 07/28/2024  6:56 AM      Failed - This refill cannot be delegated      Failed - Urine Drug Screen completed in last 360 days      Failed - Valid encounter within last 6 months    Recent Outpatient Visits           6 months ago Primary hypertension   [place] [facility] Gasper Nancyann BRAVO, MD              Passed - Patient is not pregnant

## 2024-07-29 MED ORDER — ALPRAZOLAM 0.25 MG PO TABS: ORAL_TABLET | ORAL | 2 refills | Status: DC

## 2024-08-06 ENCOUNTER — Encounter: Payer: Self-pay | Admitting: Family Medicine

## 2024-08-06 ENCOUNTER — Ambulatory Visit (INDEPENDENT_AMBULATORY_CARE_PROVIDER_SITE_OTHER): Admitting: Family Medicine

## 2024-08-06 VITALS — BP 144/80 | HR 85 | Ht 63.0 in | Wt 160.6 lb

## 2024-08-06 DIAGNOSIS — E669 Obesity, unspecified: Secondary | ICD-10-CM

## 2024-08-06 DIAGNOSIS — E785 Hyperlipidemia, unspecified: Secondary | ICD-10-CM | POA: Diagnosis not present

## 2024-08-06 DIAGNOSIS — I10 Essential (primary) hypertension: Secondary | ICD-10-CM

## 2024-08-06 DIAGNOSIS — F32A Depression, unspecified: Secondary | ICD-10-CM | POA: Diagnosis not present

## 2024-08-06 DIAGNOSIS — F411 Generalized anxiety disorder: Secondary | ICD-10-CM

## 2024-08-06 DIAGNOSIS — R7303 Prediabetes: Secondary | ICD-10-CM

## 2024-08-06 DIAGNOSIS — Z23 Encounter for immunization: Secondary | ICD-10-CM | POA: Diagnosis not present

## 2024-08-06 MED ORDER — ALPRAZOLAM 0.25 MG PO TABS: ORAL_TABLET | ORAL | 2 refills | Status: DC

## 2024-08-06 MED ORDER — COVID-19 MRNA VACC (MODERNA) 50 MCG/0.5ML IM SUSY: 0.5000 mL | PREFILLED_SYRINGE | Freq: Once | INTRAMUSCULAR | 0 refills | Status: AC

## 2024-08-06 MED ORDER — CLORAZEPATE DIPOTASSIUM 15 MG PO TABS: ORAL_TABLET | ORAL | 1 refills | Status: AC

## 2024-08-06 MED ORDER — PHENTERMINE HCL 15 MG PO CAPS: 15.0000 mg | ORAL_CAPSULE | ORAL | 1 refills | Status: AC
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
## 2024-08-07 DIAGNOSIS — R7303 Prediabetes: Secondary | ICD-10-CM | POA: Diagnosis not present

## 2024-08-08 ENCOUNTER — Other Ambulatory Visit: Payer: Self-pay | Admitting: Family Medicine

## 2024-08-08 DIAGNOSIS — E785 Hyperlipidemia, unspecified: Secondary | ICD-10-CM

## 2024-08-08 LAB — COMPREHENSIVE METABOLIC PANEL WITH GFR
ALT: 26 IU/L (ref 0–32)
AST: 21 IU/L (ref 0–40)
Albumin: 4.2 g/dL (ref 3.8–4.8)
Alkaline Phosphatase: 99 IU/L (ref 44–121)
BUN/Creatinine Ratio: 30 — ABNORMAL HIGH (ref 12–28)
BUN: 18 mg/dL (ref 8–27)
Bilirubin Total: 0.4 mg/dL (ref 0.0–1.2)
CO2: 26 mmol/L (ref 20–29)
Calcium: 9.2 mg/dL (ref 8.7–10.3)
Chloride: 98 mmol/L (ref 96–106)
Creatinine, Ser: 0.61 mg/dL (ref 0.57–1.00)
Globulin, Total: 2.6 g/dL (ref 1.5–4.5)
Glucose: 105 mg/dL — ABNORMAL HIGH (ref 70–99)
Potassium: 3.6 mmol/L (ref 3.5–5.2)
Sodium: 138 mmol/L (ref 134–144)
Total Protein: 6.8 g/dL (ref 6.0–8.5)
eGFR: 93 mL/min/1.73 (ref >59)

## 2024-08-08 LAB — HEMOGLOBIN A1C
Est. average glucose Bld gHb Est-mCnc: 126 mg/dL
Hgb A1c MFr Bld: 6 % — ABNORMAL HIGH (ref 4.8–5.6)

## 2024-08-08 LAB — LIPID PANEL
Chol/HDL Ratio: 3 ratio (ref 0.0–4.4)
Cholesterol, Total: 208 mg/dL — ABNORMAL HIGH (ref 100–199)
HDL: 70 mg/dL (ref >39)
LDL Chol Calc (NIH): 118 mg/dL — ABNORMAL HIGH (ref 0–99)
Triglycerides: 115 mg/dL (ref 0–149)
VLDL Cholesterol Cal: 20 mg/dL (ref 5–40)

## 2024-08-08 LAB — CBC
Hematocrit: 41.3 % (ref 34.0–46.6)
Hemoglobin: 13.5 g/dL (ref 11.1–15.9)
MCH: 31.7 pg (ref 26.6–33.0)
MCHC: 32.7 g/dL (ref 31.5–35.7)
MCV: 97 fL (ref 79–97)
Platelets: 222 x10E3/uL (ref 150–450)
RBC: 4.26 x10E6/uL (ref 3.77–5.28)
RDW: 12.5 % (ref 11.7–15.4)
WBC: 5.3 x10E3/uL (ref 3.4–10.8)

## 2024-08-11 ENCOUNTER — Ambulatory Visit: Payer: Self-pay | Admitting: Family Medicine

## 2024-10-07 ENCOUNTER — Ambulatory Visit

## 2024-10-07 ENCOUNTER — Ambulatory Visit: Payer: Self-pay

## 2024-12-20 ENCOUNTER — Other Ambulatory Visit: Payer: Self-pay | Admitting: Family Medicine

## 2024-12-20 DIAGNOSIS — F411 Generalized anxiety disorder: Secondary | ICD-10-CM

## 2024-12-22 NOTE — Telephone Encounter (Signed)
 Requested medication (s) are due for refill today - yes  Requested medication (s) are on the active medication list -yes  Future visit scheduled -no  Last refill: 08/06/24 #30 2RF  Notes to clinic: non delegated Rx  Requested Prescriptions  Pending Prescriptions Disp Refills   ALPRAZolam  (XANAX ) 0.25 MG tablet [Pharmacy Med Name: ALPRAZOLAM  0.25MG  TABLETS] 30 tablet     Sig: TAKE 1 TABLET(0.25 MG) BY MOUTH DAILY AS NEEDED FOR ANXIETY     Not Delegated - Psychiatry: Anxiolytics/Hypnotics 2 Failed - 12/22/2024 12:09 PM      Failed - This refill cannot be delegated      Failed - Urine Drug Screen completed in last 360 days      Passed - Patient is not pregnant      Passed - Valid encounter within last 6 months    Recent Outpatient Visits           4 months ago Primary hypertension   [place] [facility] Nancyann BRAVO, MD   11 months ago Primary hypertension   [facility] Nancyann BRAVO, MD                 Requested Prescriptions  Pending Prescriptions Disp Refills   ALPRAZolam  (XANAX ) 0.25 MG tablet [Pharmacy Med Name: ALPRAZOLAM  0.25MG  TABLETS] 30 tablet     Sig: TAKE 1 TABLET(0.25 MG) BY MOUTH DAILY AS NEEDED FOR ANXIETY     Not Delegated - Psychiatry: Anxiolytics/Hypnotics 2 Failed - 12/22/2024 12:09 PM      Failed - This refill cannot be delegated      Failed - Urine Drug Screen completed in last 360 days      Passed - Patient is not pregnant      Passed - Valid encounter within last 6 months    Recent Outpatient Visits           4 months ago Primary hypertension   [place] [facility] Nancyann BRAVO, MD   11 months ago Primary hypertension   [facility] Gas[facility] Nancyann BRAVO, MD

## 2024-12-23 ENCOUNTER — Ambulatory Visit

## 2024-12-23 DIAGNOSIS — Z1231 Encounter for screening mammogram for malignant neoplasm of breast: Secondary | ICD-10-CM

## 2024-12-23 DIAGNOSIS — Z780 Asymptomatic menopausal state: Secondary | ICD-10-CM

## 2024-12-23 DIAGNOSIS — Z0000 Encounter for general adult medical examination without abnormal findings: Secondary | ICD-10-CM | POA: Diagnosis not present

## 2024-12-23 NOTE — Patient Instructions (Addendum)
 Gabrielle Salinas,  Thank you for taking the time for your Medicare Wellness Visit. I appreciate your continued commitment to your health goals. Please review the care plan we discussed, and feel free to reach out if I can assist you further.  Please note that Annual Wellness Visits do not include a physical exam. Some assessments may be limited, especially if the visit was conducted virtually. If needed, we may recommend an in-person follow-up with your provider.  Ongoing Care Seeing your primary care provider every 3 to 6 months helps us  monitor your health and provide consistent, personalized care. 01/12/25 @ 9:00 AM APPT FOR PHYSICAL W/ DR.FISHER  Referrals If a referral was made during today's visit and you haven't received any updates within two weeks, please contact the referred provider directly to check on the status.  REFERRAL SENT FOR MAMMOGRAM AND BONE DENSITY You have an order for:  []   2D Mammogram  [x]   3D Mammogram  [x]   Bone Density     Please call for appointment:  [facility]  [address] [place] [address] [phone] [facility] [address] [place], [place] [address] [phone] [facility] [address] [place], [place] [address] [phone]   Make sure to wear two-piece clothing.  No lotions, powders, or deodorants the day of the appointment. Make sure to bring picture ID and insurance card.  Bring list of medications you are currently taking including any supplements.   Schedule your [address] screening mammogram through MyChart!   Log into your MyChart account.  Go to 'Visit' (or 'Appointments' if on mobile App) --> Schedule an Appointment  Under 'Select a Reason for Visit' choose the Mammogram Screening option.  Complete the pre-visit questions and select the time and place that best fits your schedule.    Recommended Screenings:  Health Maintenance  Topic Date Due   Osteoporosis screening with Bone Density Scan  07/19/2024   COVID-19 Vaccine (5 - 2025-26 season) 07/28/2024   Medicare Annual Wellness Visit  10/01/2024   Breast Cancer Screening  03/14/2025   Cologuard (Stool DNA test)  06/23/2026   DTaP/Tdap/Td vaccine (4 - Td or Tdap) 04/29/2031   Pneumococcal Vaccine for age over 25  Completed   Flu Shot  Completed   Hepatitis C Screening  Completed   Zoster (Shingles) Vaccine  Completed   Meningitis B Vaccine  Aged Out   Colon Cancer Screening  Discontinued     Vision: Annual vision screenings are recommended for early detection of glaucoma, cataracts, and diabetic retinopathy. These exams can also reveal signs of chronic conditions such as diabetes and high blood pressure.  Dental: Annual dental screenings help detect early signs of oral cancer, gum disease, and other conditions linked to overall health, including heart disease and diabetes.  Please see the attached documents for additional preventive care recommendations.   NEXT AWV 12/29/25 @ 1:50 PM IN PERSON

## 2024-12-23 NOTE — Progress Notes (Signed)
 "  Chief Complaint  Patient presents with   Medicare Wellness     Subjective:   Gabrielle Salinas is a 74 y.o. female who presents for a Medicare Annual Wellness Visit.  Visit info / Clinical Intake: Medicare Wellness Visit Type:: Subsequent Annual Wellness Visit Persons participating in visit and providing information:: patient Medicare Wellness Visit Mode:: Telephone If telephone:: video declined Since this visit was completed virtually, some vitals may be partially provided or unavailable. Missing vitals are due to the limitations of the virtual format.: Unable to obtain vitals - no equipment If Telephone or Video please confirm:: I connected with patient using audio/video enable telemedicine. I verified patient identity with two identifiers, discussed telehealth limitations, and patient agreed to proceed. Patient Location:: HOME Provider Location:: OFFICE Interpreter Needed?: No Pre-visit prep was completed: yes AWV questionnaire completed by patient prior to visit?: no Living arrangements:: (!) lives alone Patient's Overall Health Status Rating: very good Typical amount of pain: none Does pain affect daily life?: no Are you currently prescribed opioids?: no  Dietary Habits and Nutritional Risks How many meals a day?: 3 Eats fruit and vegetables daily?: yes Most meals are obtained by: preparing own meals In the last 2 weeks, have you had any of the following?: none Diabetic:: no  Functional Status Activities of Daily Living (to include ambulation/medication): Independent Ambulation: Independent Medication Administration: Independent Home Management (perform basic housework or laundry): Independent Manage your own finances?: yes Primary transportation is: driving Concerns about vision?: no *vision screening is required for [facility]* (RX GLASSES & READERS- PATTY VISION OR [facility]) Concerns about hearing?: no  Fall Screening Falls in the past year?: 1 Number of falls  in past year: 1 Was there an injury with Fall?: 0 Fall Risk Category Calculator: 2 Patient Fall Risk Level: Moderate Fall Risk  Fall Risk Patient at Risk for Falls Due to: History of fall(s) Fall risk Follow up: Falls evaluation completed; Falls prevention discussed  Home and Transportation Safety: All rugs have non-skid backing?: yes All stairs or steps have railings?: yes Grab bars in the bathtub or shower?: yes Have non-skid surface in bathtub or shower?: yes Good home lighting?: yes Regular seat belt use?: yes Hospital stays in the last year:: no  Cognitive Assessment Difficulty concentrating, remembering, or making decisions? : no Will 6CIT or Mini Cog be Completed: yes What year is it?: 0 points What month is it?: 0 points Give patient an address phrase to remember (5 components): [address] [place], [place] what time is it?: 0 points Count backwards from 20 to 1: 0 points Say the months of the year in reverse: 0 points Repeat the address phrase from earlier: 0 points 6 CIT Score: 0 points  Advance Directives (For Healthcare) Does Patient Have a Medical Advance Directive?: Yes Does patient want to make changes to medical advance directive?: No - Patient declined  Reviewed/Updated  Reviewed/Updated: Reviewed All (Medical, Surgical, Family, Medications, Allergies, Care Teams, Patient Goals)    Allergies (verified) Bactrim [sulfamethoxazole-trimethoprim], Bee venom, Daypro  [oxaprozin], Sulfa antibiotics, and Sulfasalazine   Current Medications (verified) Outpatient Encounter Medications as of 12/23/2024  Medication Sig   acetaminophen  (TYLENOL ) 325 MG tablet Take 650 mg by mouth every 4 (four) hours as needed for mild pain or fever.   alendronate  (FOSAMAX ) 70 MG tablet TAKE 1 TABLET BY MOUTH EVERY WEEK WITH A FULL GLASS OF WATER AND ON AN EMPTY STOMACH   azelastine  (ASTELIN ) 0.1 % nasal spray Use in  each nostril as directedUSE 2 SPRAYS IN EACH NOSTRIL TWICE  DAILY AS DIRECTED   buPROPion  (WELLBUTRIN  XL) 150 MG 24 hr tablet TAKE 1 TABLET(150 MG) BY MOUTH IN THE MORNING   Calcium  Carb-Cholecalciferol  600-500 MG-UNIT CAPS Take 600 Units by mouth daily.   clorazepate  (TRANXENE ) 15 MG tablet TAKE 1/2 TABLET BY MOUTH THREE TIMES DAILY AS NEEDED   esomeprazole  (NEXIUM ) 40 MG capsule TAKE ONE CAPSULE BY MOUTH DAILY AT 12PM AT NOON (Patient taking differently: TAKE ONE CAPSULE BY MOUTH DAILY AT 12PM AT NOON TAKES PRN)   ezetimibe  (ZETIA ) 10 MG tablet Take 1 tablet (10 mg total) by mouth daily.   FLUoxetine  (PROZAC ) 40 MG capsule Take 1 capsule (40 mg total) by mouth daily.   hydrochlorothiazide  (HYDRODIURIL ) 12.5 MG tablet TAKE 1 TABLET(12.5 MG) BY MOUTH DAILY   lisinopril  (ZESTRIL ) 10 MG tablet TAKE 1 TABLET(10 MG) BY MOUTH DAILY   Misc Natural Products (JOINT SUPPORT) CAPS Take 1 capsule by mouth daily.   montelukast  (SINGULAIR ) 10 MG tablet TAKE ONE TABLET BY MOUTH DAILY AT 9PM FOR ALLERGIES   Multiple Vitamins-Minerals (PRESERVISION AREDS 2 PO) Take 1 tablet by mouth 2 (two) times daily.   Omega-3 Fatty Acids (FISH OIL) 1000 MG CAPS Take 1 capsule by mouth daily.    phentermine  15 MG capsule Take 1 capsule (15 mg total) by mouth every morning.   triamcinolone  cream (KENALOG ) 0.5 % APPLY TOPICALLY TO THE AFFECTED AREA DAILY AS NEEDED   Turmeric 500 MG CAPS Take 1 capsule by mouth daily.   vitamin C (ASCORBIC ACID ) 500 MG tablet Take 500 mg by mouth daily.   vitamin E 1000 UNIT capsule Take 1,000 Units by mouth daily.    [DISCONTINUED] ALPRAZolam  (XANAX ) 0.25 MG tablet TAKE 1 TABLET(0.25 MG) BY MOUTH DAILY AS NEEDED FOR ANXIETY   No facility-administered encounter medications on file as of 12/23/2024.    History: Past Medical History:  Diagnosis Date   Allergy    Anxiety    Cataract    COVID-19 virus infection 11/29/2021   Depression    GERD (gastroesophageal reflux disease)    Hyperlipidemia    Hypertension    Osteoporosis    Past Surgical  History:  Procedure Laterality Date   BREAST BIOPSY Right <10 yrs ago   x2 benign results   CATARACT EXTRACTION, BILATERAL  09/2019   COLONOSCOPY  11/19/2017   GI Bleed  2008   RHINOPLASTY     TOTAL HIP ARTHROPLASTY Right 11/29/2021   Procedure: TOTAL HIP ARTHROPLASTY ANTERIOR APPROACH;  Surgeon: Kathlynn Sharper, MD;  Location: [facility] OR[facility];  Service: Orthopedics;  Laterality: Right;   UPPER GASTROINTESTINAL ENDOSCOPY  10/2017   [facility]   VAGINAL HYSTERECTOMY  1982   due to abnormal pap smears and menorrhagia   Family History  Problem Relation Age of Onset   Lupus Mother    Heart attack Father    Diabetes Father    Diabetes Sister    Diabetes Sister    Diabetes Sister    Breast cancer Neg Hx    Social History   Occupational History   Occupation: retired    Comment: keeps granddaughter  Tobacco Use   Smoking status: Never   Smokeless tobacco: Never  Vaping Use   Vaping status: Never Used  Substance and Sexual Activity   Alcohol use: Yes    Alcohol/week: 0.0 standard drinks of alcohol    Comment: 1glass of wine on special occasions   Drug use: Never   Sexual  activity: Yes   Tobacco Counseling Counseling given: Not Answered  SDOH Screenings   Food Insecurity: No Food Insecurity (12/23/2024)  Housing: Unknown (12/23/2024)  Transportation Needs: No Transportation Needs (12/23/2024)  Utilities: Not At Risk (12/23/2024)  Alcohol Screen: Low Risk (10/02/2023)  Depression (PHQ2-9): Low Risk (12/23/2024)  Financial Resource Strain: Low Risk (10/02/2023)  Physical Activity: Insufficiently Active (12/23/2024)  Social Connections: Moderately Isolated (12/23/2024)  Stress: No Stress Concern Present (12/23/2024)  Tobacco Use: Low Risk (12/23/2024)  Health Literacy: Adequate Health Literacy (12/23/2024)   See flowsheets for full screening details  Depression Screen PHQ 2 & 9 Depression Scale- Over the past 2 weeks, how often have you been bothered by any of the following problems? Little  interest or pleasure in doing things: 0 Feeling down, depressed, or hopeless (PHQ Adolescent also includes...irritable): 0 PHQ-2 Total Score: 0 Trouble falling or staying asleep, or sleeping too much: 0 Feeling tired or having little energy: 0 Poor appetite or overeating (PHQ Adolescent also includes...weight loss): 0 Feeling bad about yourself - or that you are a failure or have let yourself or your family down: 0 Trouble concentrating on things, such as reading the newspaper or watching television (PHQ Adolescent also includes...like school work): 0 Moving or speaking so slowly that other people could have noticed. Or the opposite - being so fidgety or restless that you have been moving around a lot more than usual: 0 Thoughts that you would be better off dead, or of hurting yourself in some way: 0 PHQ-9 Total Score: 0 If you checked off any problems, how difficult have these problems made it for you to do your work, take care of things at home, or get along with other people?: Not difficult at all  Depression Treatment Depression Interventions/Treatment : [id number] Score <4 Follow-up Not Indicated     Goals Addressed             This Visit's Progress    DIET - REDUCE SUGAR INTAKE               Objective:    There were no vitals filed for this visit. There is no height or weight on file to calculate BMI.  Hearing/Vision screen No results found. Immunizations and Health Maintenance Health Maintenance  Topic Date Due   Bone Density Scan  07/19/2024   COVID-19 Vaccine (5 - 2025-26 season) 07/28/2024   Mammogram  03/14/2025   Medicare Annual Wellness (AWV)  12/23/2025   Fecal DNA (Cologuard)  06/23/2026   DTaP/Tdap/Td (4 - Td or Tdap) 04/29/2031   Pneumococcal Vaccine: 50+ Years  Completed   Influenza Vaccine  Completed   Hepatitis C Screening  Completed   Zoster Vaccines- Shingrix  Completed   Meningococcal B Vaccine  Aged Out   Colonoscopy  Discontinued         Assessment/Plan:  This is a routine wellness examination for Gabrielle Salinas.  Patient Care Team: Gasper Nancyann BRAVO, MD as PCP - General (Family Medicine) Patty, A. Robynn, MD as Consulting Physician (Ophthalmology) Marchia Drivers, MD as Referring Physician (Orthopedic Surgery) Maree Lonni Inks, MD as Consulting Physician (Ophthalmology)  I have personally reviewed and noted the following in the patients chart:   Medical and social history Use of alcohol, tobacco or illicit drugs  Current medications and supplements including opioid prescriptions. Functional ability and status Nutritional status Physical activity Advanced directives List of other physicians Hospitalizations, surgeries, and ER visits in previous 12 months Vitals Screenings to include cognitive, depression, and falls  Referrals and appointments  No orders of the defined types were placed in this encounter.  In addition, I have reviewed and discussed with patient certain preventive protocols, quality metrics, and best practice recommendations. A written personalized care plan for preventive services as well as general preventive health recommendations were provided to patient.   Jhonnie GORMAN Das, LPN   8/72/7973   Return in 1 year (on 12/23/2025).  After Visit Summary: (MyChart) Due to this being a telephonic visit, the after visit summary with patients personalized plan was offered to patient via MyChart   Nurse Notes: REFERRALS SENT FOR MAMMOGRAM & BONE DENSITY SCAN; UTD ON SHOTS; AGED OUT OF COLONOSCOPY  "

## 2025-01-12 ENCOUNTER — Encounter: Admitting: Family Medicine

## 2025-03-16 ENCOUNTER — Other Ambulatory Visit

## 2025-03-16 ENCOUNTER — Encounter

## 2025-12-29 ENCOUNTER — Ambulatory Visit
